# Patient Record
Sex: Female | Born: 1965 | Hispanic: Yes | Marital: Married | Smoking: Former smoker
Health system: Southern US, Community
[De-identification: ages and names within clinical notes are randomized; demographics above are authoritative.]

## PROBLEM LIST (undated history)

## (undated) DIAGNOSIS — M5030 Other cervical disc degeneration, unspecified cervical region: Secondary | ICD-10-CM

## (undated) DIAGNOSIS — E119 Type 2 diabetes mellitus without complications: Secondary | ICD-10-CM

## (undated) DIAGNOSIS — I10 Essential (primary) hypertension: Secondary | ICD-10-CM

## (undated) DIAGNOSIS — D649 Anemia, unspecified: Secondary | ICD-10-CM

## (undated) DIAGNOSIS — Z531 Procedure and treatment not carried out because of patient's decision for reasons of belief and group pressure: Secondary | ICD-10-CM

## (undated) DIAGNOSIS — T8859XA Other complications of anesthesia, initial encounter: Secondary | ICD-10-CM

## (undated) DIAGNOSIS — R011 Cardiac murmur, unspecified: Secondary | ICD-10-CM

## (undated) DIAGNOSIS — R1013 Epigastric pain: Secondary | ICD-10-CM

## (undated) DIAGNOSIS — I340 Nonrheumatic mitral (valve) insufficiency: Secondary | ICD-10-CM

## (undated) DIAGNOSIS — M791 Myalgia, unspecified site: Secondary | ICD-10-CM

## (undated) DIAGNOSIS — U071 COVID-19: Secondary | ICD-10-CM

## (undated) DIAGNOSIS — I361 Nonrheumatic tricuspid (valve) insufficiency: Secondary | ICD-10-CM

## (undated) DIAGNOSIS — M1990 Unspecified osteoarthritis, unspecified site: Secondary | ICD-10-CM

## (undated) DIAGNOSIS — R12 Heartburn: Secondary | ICD-10-CM

## (undated) DIAGNOSIS — N393 Stress incontinence (female) (male): Secondary | ICD-10-CM

## (undated) DIAGNOSIS — M51379 Other intervertebral disc degeneration, lumbosacral region without mention of lumbar back pain or lower extremity pain: Secondary | ICD-10-CM

## (undated) DIAGNOSIS — K219 Gastro-esophageal reflux disease without esophagitis: Secondary | ICD-10-CM

## (undated) DIAGNOSIS — F329 Major depressive disorder, single episode, unspecified: Secondary | ICD-10-CM

## (undated) DIAGNOSIS — F419 Anxiety disorder, unspecified: Secondary | ICD-10-CM

## (undated) DIAGNOSIS — F32A Depression, unspecified: Secondary | ICD-10-CM

## (undated) DIAGNOSIS — I351 Nonrheumatic aortic (valve) insufficiency: Secondary | ICD-10-CM

## (undated) DIAGNOSIS — M5412 Radiculopathy, cervical region: Secondary | ICD-10-CM

## (undated) DIAGNOSIS — N926 Irregular menstruation, unspecified: Secondary | ICD-10-CM

## (undated) DIAGNOSIS — E559 Vitamin D deficiency, unspecified: Secondary | ICD-10-CM

## (undated) HISTORY — DX: Essential (primary) hypertension: I10

## (undated) HISTORY — PX: CHOLECYSTECTOMY, LAPAROSCOPIC: SHX56

## (undated) HISTORY — PX: INCISION TENDON SHEATH HAND: SUR698

## (undated) HISTORY — DX: Type 2 diabetes mellitus without complications: E11.9

## (undated) HISTORY — PX: LAPAROSCOPIC OVARIAN CYSTECTOMY: SHX6248

## (undated) HISTORY — PX: MENISCECTOMY: SHX123

## (undated) HISTORY — PX: JOINT REPLACEMENT: SHX530

## (undated) HISTORY — PX: ESOPHAGOGASTRODUODENOSCOPY: SHX1529

## (undated) HISTORY — PX: CHOLECYSTECTOMY: SHX55

## (undated) HISTORY — PX: TUBAL LIGATION: SHX77

## (undated) HISTORY — DX: Heartburn: R12

---

## 2013-01-03 LAB — HM PAP SMEAR

## 2014-08-08 LAB — HM MAMMOGRAPHY

## 2014-11-28 ENCOUNTER — Emergency Department: Admit: 2014-11-28 | Disposition: A | Discharge status: Home / Self Care | Payer: Self-pay | Admitting: Student

## 2015-10-03 ENCOUNTER — Encounter: Payer: Self-pay | Admitting: Obstetrics and Gynecology

## 2015-10-23 ENCOUNTER — Other Ambulatory Visit: Payer: Self-pay | Admitting: Obstetrics and Gynecology

## 2015-10-23 ENCOUNTER — Encounter: Payer: Self-pay | Admitting: Obstetrics and Gynecology

## 2015-10-23 ENCOUNTER — Other Ambulatory Visit (INDEPENDENT_AMBULATORY_CARE_PROVIDER_SITE_OTHER): Payer: BLUE CROSS/BLUE SHIELD

## 2015-10-23 ENCOUNTER — Ambulatory Visit (INDEPENDENT_AMBULATORY_CARE_PROVIDER_SITE_OTHER): Payer: BLUE CROSS/BLUE SHIELD | Admitting: Obstetrics and Gynecology

## 2015-10-23 VITALS — BP 155/85 | HR 69 | Ht 64.0 in | Wt 177.0 lb

## 2015-10-23 DIAGNOSIS — N920 Excessive and frequent menstruation with regular cycle: Secondary | ICD-10-CM

## 2015-10-23 DIAGNOSIS — D649 Anemia, unspecified: Secondary | ICD-10-CM | POA: Insufficient documentation

## 2015-10-23 DIAGNOSIS — D259 Leiomyoma of uterus, unspecified: Secondary | ICD-10-CM | POA: Insufficient documentation

## 2015-10-23 NOTE — Patient Instructions (Signed)
1. Endometrial biopsy is performed today. 2. Ultrasound is performed today. 3. Return in 10 days for follow-up on test results and further management planning

## 2015-10-23 NOTE — Progress Notes (Signed)
GYN ENCOUNTER NOTE  Subjective:   Spanish interpreter is utilized     Caitlyn [name] is a 50 y.o. [phone] female is here for gynecologic evaluation of the following issues:  1. Menorrhagia to anemia.   2. Dyspareunia  The patient is a 50 year old married Hispanic female para 3013, using BTL for contraception, who presents for evaluation of the above complaints.  Past GYN history: Menarche-Age 25 Cycles- monthly Duration of flow- 5 days(2H, 3L) Dysmenorrhea-mild; central and left lower quadrant cramping with minimal low back pain; occasionally takes Advil for cramps Deep thrusting dyspareunia is noted No history of abnormal Pap smears. No history of STI's. Menorrhagia, worsening with new diagnosis of anemia Ultrasound 06/19/2011-uterus measures 7.0 x 5.9 cm; endometrial stripe 1.9 cm; right and left ovaries normal; free fluid in cul-de-sac 10/24/2014 LABS: CBC  9.3/31/332,000; Iron 19 (low); TIBC 440 (normal); iron saturation 4% (L)   Gynecologic History Patient's last menstrual period was 10/20/2015. Contraception: tubal ligation Last Pap: normal. Results were: normal  Obstetric History OB History  Gravida Para Term Preterm AB SAB TAB Ectopic Multiple Living  4 3 3  1 1    3     # Outcome Date GA Lbr Len/2nd Weight Sex Delivery Anes PTL Lv  4 Term 02/02/91    [place]  3 SAB 1992        FD  2 Term 04/16/89    [place]  1 Term 06/24/84    [place]      Past Medical History  Diagnosis Date  . Hypertension   . Heart burn     Past Surgical History  Procedure Laterality Date  . Cholecystectomy, laparoscopic    . Laparoscopic ovarian cystectomy    . Tubal ligation      No current outpatient prescriptions on file prior to visit.   No current facility-administered medications on file prior to visit.    No Known Allergies  Social History   Social History  . Marital Status: Single    Spouse Name: N/A  . Number of Children: N/A  .  Years of Education: N/A   Occupational History  . Not on file.   Social History Main Topics  . Smoking status: Former Research scientist (life sciences)  . Smokeless tobacco: Never Used  . Alcohol Use: 0.0 oz/week    0 Standard drinks or equivalent per week  . Drug Use: No  . Sexual Activity:    Partners: Male   Other Topics Concern  . Not on file   Social History Narrative  . No narrative on file    Family History  Problem Relation Age of Onset  . Osteoarthritis Mother   . Osteoarthritis Sister   . Heart failure Father   . Diabetes Father   . Hypertension Father     The following portions of the patient's history were reviewed and updated as appropriate: allergies, current medications, past family history, past medical history, past social history, past surgical history and problem list.  Review of Systems Review of Systems - General ROS: negative for - chills, fatigue, fever, hot flashes, malaise or night sweats Hematological and Lymphatic ROS: negative for - bleeding problems or swollen lymph nodes Gastrointestinal ROS: negative for - abdominal pain, blood in stools, change in bowel habits and nausea/vomiting Musculoskeletal ROS: negative for - joint pain, muscle pain or muscular weakness Genito-Urinary ROS: negative for -   dysuria, genital discharge, genital ulcers, hematuria, incontinence, nocturia . POSITIVE-change  in menstrual cycle, dysmenorrhea,dyspareunia,irregular/heavy menses, pelvic pain Objective:   BP 155/85 mmHg  Pulse 69  Ht 5\' 4"  (1.626 m)  Wt 177 lb (80.287 kg)  BMI 30.37 kg/m2  LMP 10/20/2015  CONSTITUTIONAL: Well-developed, well-nourished female in no acute distress.  HENT:  Normocephalic, atraumatic.  NECK: Normal range of motion, supple, no masses.  Normal thyroid.  SKIN: Skin is warm and dry. No rash noted. Not diaphoretic. No erythema. No pallor. [place]: Alert and oriented to person, place, and time. PSYCHIATRIC: Normal mood and affect. Normal behavior. Normal  judgment and thought content. CARDIOVASCULAR:Not Examined RESPIRATORY: Not Examined BREASTS: Not Examined ABDOMEN: Soft, non distended; Non tender.  No Organomegaly. PELVIC:  External Genitalia: Normal  BUS: Normal  Vagina: Normal  Cervix: Normal; parous, no cervical motion tenderness, decreased mobility  Uterus: irregular, 14-16 weeks size, decreased mobility, left sided prominence  Adnexa: Normal; nonpalpable and nontender  RV: Normal external exam  Bladder: Nontender MUSCULOSKELETAL: Normal range of motion. No tenderness.  No cyanosis, clubbing, or edema.  PROCEDURE: Endometrial biopsy Indications-menorrhagia to anemia Bimanual exam-14-16 weeks size with left-sided prominence, globular Uterine sound-10 cm Endometrial biopsy 3 obtained with 3 mm pipette with production of yellow fluid on first pass, burgundy fluid/tissue on second and third passes Estimated blood loss-minimal Procedure well-tolerated     Assessment:   1. Menorrhagia to anemia 2. Dysmenorrhea/dyspareunia 3. Pelvic pain left lower quadrant 4. Fibroid uterus   Plan:  1. Endometrial biopsy is noted 2. Pelvic ultrasound 3. Return in 10 days for follow-up and further management planning  A total of 30 minutes were spent face-to-face with the patient during the encounter with greater than 50% dealing with counseling and coordination of care.  Brayton Mars, MD  Note: This dictation was prepared with Dragon dictation along with smaller phrase technology. Any transcriptional errors that result from this process are unintentional.

## 2015-10-25 LAB — PATHOLOGY

## 2015-11-05 ENCOUNTER — Encounter: Payer: BLUE CROSS/BLUE SHIELD | Admitting: Obstetrics and Gynecology

## 2015-11-05 NOTE — Progress Notes (Signed)
This encounter was created in error - please disregard.

## 2015-11-07 ENCOUNTER — Encounter: Payer: Self-pay | Admitting: Obstetrics and Gynecology

## 2015-11-07 ENCOUNTER — Ambulatory Visit (INDEPENDENT_AMBULATORY_CARE_PROVIDER_SITE_OTHER): Payer: BLUE CROSS/BLUE SHIELD | Admitting: Obstetrics and Gynecology

## 2015-11-07 VITALS — BP 146/81 | HR 66 | Ht 64.0 in | Wt 180.3 lb

## 2015-11-07 DIAGNOSIS — D259 Leiomyoma of uterus, unspecified: Secondary | ICD-10-CM | POA: Diagnosis not present

## 2015-11-07 DIAGNOSIS — N920 Excessive and frequent menstruation with regular cycle: Secondary | ICD-10-CM | POA: Diagnosis not present

## 2015-11-07 DIAGNOSIS — D649 Anemia, unspecified: Secondary | ICD-10-CM

## 2015-11-07 NOTE — Patient Instructions (Signed)
1. TAH LSO will be scheduled today 2. Return for preop appointment the week before surgery 3. Recommend iron supplementation daily due to anemia

## 2015-11-08 NOTE — Progress Notes (Signed)
Chief complaint: 1. Menorrhagia to anemia 2. Multiple fibroid uterus 3. Follow-up on endometrial biopsy  Patient presents today for follow-up on the above issues. Endometrial biopsy was performed because of history of menorrhagia to anemia. Pelvic ultrasound has demonstrated an enlarged fibroid uterus. Endometrium was thickened. Clinical exam is notable for 14-16 week size fibroid uterus  Endometrial biopsy results: Fragmented weakly proliferative endometrium with stromal and glandular breakdown, mixed with blood. No hyperplasia or carcinoma.  These findings were reviewed with the patient. Interpreter was present. Options of management were reviewed.  OBJECTIVE: BP 146/81 mmHg  Pulse 66  Ht 5\' 4"  (1.626 m)  Wt 180 lb 4.8 oz (81.784 kg)  BMI 30.93 kg/m2  LMP 10/20/2015 (Exact Date) Physical exam-deferred  ASSESSMENT: 1. Multi-fibroid uterus-14-16 weeks, symptomatic 2. Benign endometrial biopsy  PLAN: 1. Scheduled TAH/LSO 2. Return for preop appointment the week prior to surgery 3. Multiple questions regarding surgery were answered with the aid of interpreter.  A total of 15 minutes were spent face-to-face with the patient during this encounter and over half of that time dealt with counseling and coordination of care.  Brayton Mars, MD  Note: This dictation was prepared with Dragon dictation along with smaller phrase technology. Any transcriptional errors that result from this process are unintentional.

## 2016-04-03 DIAGNOSIS — M2550 Pain in unspecified joint: Secondary | ICD-10-CM | POA: Insufficient documentation

## 2016-04-03 DIAGNOSIS — M25562 Pain in left knee: Secondary | ICD-10-CM | POA: Insufficient documentation

## 2016-04-10 DIAGNOSIS — R768 Other specified abnormal immunological findings in serum: Secondary | ICD-10-CM | POA: Insufficient documentation

## 2016-04-10 DIAGNOSIS — R7689 Other specified abnormal immunological findings in serum: Secondary | ICD-10-CM | POA: Insufficient documentation

## 2016-04-13 ENCOUNTER — Ambulatory Visit
Admission: RE | Admit: 2016-04-13 | Discharge: 2016-04-13 | Disposition: A | Discharge status: Home / Self Care | Payer: Disability Insurance | Source: Ambulatory Visit | Attending: Ophthalmology | Admitting: Ophthalmology

## 2016-04-13 ENCOUNTER — Other Ambulatory Visit: Payer: Self-pay | Admitting: Ophthalmology

## 2016-04-13 DIAGNOSIS — M5136 Other intervertebral disc degeneration, lumbar region: Secondary | ICD-10-CM

## 2016-04-13 DIAGNOSIS — M47816 Spondylosis without myelopathy or radiculopathy, lumbar region: Secondary | ICD-10-CM

## 2016-04-13 DIAGNOSIS — M5126 Other intervertebral disc displacement, lumbar region: Secondary | ICD-10-CM

## 2016-04-13 DIAGNOSIS — M545 Low back pain: Secondary | ICD-10-CM | POA: Diagnosis present

## 2016-06-24 ENCOUNTER — Encounter
Admission: RE | Admit: 2016-06-24 | Discharge: 2016-06-24 | Disposition: A | Discharge status: Home / Self Care | Payer: BLUE CROSS/BLUE SHIELD | Source: Ambulatory Visit | Attending: Orthopedic Surgery | Admitting: Orthopedic Surgery

## 2016-06-24 DIAGNOSIS — I10 Essential (primary) hypertension: Secondary | ICD-10-CM | POA: Diagnosis not present

## 2016-06-24 DIAGNOSIS — Z01810 Encounter for preprocedural cardiovascular examination: Secondary | ICD-10-CM | POA: Insufficient documentation

## 2016-06-24 DIAGNOSIS — Z01812 Encounter for preprocedural laboratory examination: Secondary | ICD-10-CM | POA: Insufficient documentation

## 2016-06-24 HISTORY — DX: Gastro-esophageal reflux disease without esophagitis: K21.9

## 2016-06-24 HISTORY — DX: Anemia, unspecified: D64.9

## 2016-06-24 HISTORY — DX: Anxiety disorder, unspecified: F41.9

## 2016-06-24 LAB — URINALYSIS COMPLETE WITH MICROSCOPIC (ARMC ONLY)
BILIRUBIN URINE: NEGATIVE
GLUCOSE, UA: NEGATIVE mg/dL
HGB URINE DIPSTICK: NEGATIVE
Ketones, ur: NEGATIVE mg/dL
LEUKOCYTES UA: NEGATIVE
Nitrite: NEGATIVE
Protein, ur: NEGATIVE mg/dL
SPECIFIC GRAVITY, URINE: 1.018 (ref 1.005–1.030)
pH: 6 (ref 5.0–8.0)

## 2016-06-24 LAB — CBC
HCT: 38.4 % (ref 35.0–47.0)
Hemoglobin: 12.6 g/dL (ref 12.0–16.0)
MCH: 24.5 pg — AB (ref 26.0–34.0)
MCHC: 32.8 g/dL (ref 32.0–36.0)
MCV: 74.7 fL — AB (ref 80.0–100.0)
PLATELETS: 206 10*3/uL (ref 150–440)
RBC: 5.14 MIL/uL (ref 3.80–5.20)
RDW: 19.7 % — ABNORMAL HIGH (ref 11.5–14.5)
WBC: 11.8 10*3/uL — ABNORMAL HIGH (ref 3.6–11.0)

## 2016-06-24 LAB — BASIC METABOLIC PANEL
Anion gap: 7 (ref 5–15)
BUN: 10 mg/dL (ref 6–20)
CHLORIDE: 102 mmol/L (ref 101–111)
CO2: 29 mmol/L (ref 22–32)
CREATININE: 0.51 mg/dL (ref 0.44–1.00)
Calcium: 9.3 mg/dL (ref 8.9–10.3)
GFR calc Af Amer: >60 mL/min (ref >60)
GLUCOSE: 114 mg/dL — AB (ref 65–99)
POTASSIUM: 3.4 mmol/L — AB (ref 3.5–5.1)
Sodium: 138 mmol/L (ref 135–145)

## 2016-06-24 LAB — SURGICAL PCR SCREEN
MRSA, PCR: NEGATIVE
Staphylococcus aureus: NEGATIVE

## 2016-06-24 LAB — TYPE AND SCREEN
ABO/RH(D): O POS
Antibody Screen: NEGATIVE

## 2016-06-24 LAB — PROTIME-INR
INR: 1
Prothrombin Time: 13.2 seconds (ref 11.4–15.2)

## 2016-06-24 LAB — SEDIMENTATION RATE: SED RATE: 9 mm/h (ref 0–30)

## 2016-06-24 LAB — APTT: aPTT: 26 seconds (ref 24–36)

## 2016-06-24 MED ORDER — TRANEXAMIC ACID 1000 MG/10ML IV SOLN
1000.0000 mg | INTRAVENOUS | Status: DC
  Filled 2016-06-24: qty 10

## 2016-06-24 NOTE — Patient Instructions (Signed)
Your procedure is scheduled on: Tuesday 07/07/16 Su procedimiento est programado para: Report to Day Surgery. 2ND FLOOR MEDICAL MALL ENTRANCE Presntese a: To find out your arrival time please call [phone] between 1PM - 3PM on Monday 07/06/16. Para saber su hora de llegada por favor llame al [phone] entre la 1PM - 3PM el da:  Remember: Instructions that are not followed completely may result in serious medical risk, up to and including death, or upon the discretion of your surgeon and anesthesiologist your surgery may need to be rescheduled.  Recuerde: Las instrucciones que no se siguen completamente Heritage manager en un riesgo de salud grave, incluyendo hasta la [place] o a discrecin de su cirujano y Environmental health practitioner, su ciruga se puede posponer.   __X__ 1. Do not eat food or drink liquids after midnight. No gum chewing or hard candies.  No coma alimentos ni tome lquidos despus de la medianoche.  No mastique chicle ni caramelos  duros.     __X__ 2. No alcohol/NO SMOKING for 24 hours before or after surgery.    No tome alcohol durante las 24 horas antes ni despus de la [place].   ____ 3. Bring all medications with you on the day of surgery if instructed.    Lleve todos los medicamentos con usted el da de su ciruga si se le ha indicado as.   __X__ 4. Notify your doctor if there is any change in your medical condition (cold, fever,                             infections).    Informe a su mdico si hay algn cambio en su condicin mdica (resfriado, fiebre, infecciones).   Do not wear jewelry, make-up, hairpins, clips or nail polish.  No use joyas, maquillajes, pinzas/ganchos para el cabello ni esmalte de uas.  Do not wear lotions, powders, or perfumes. You may wear deodorant.  No use lociones, polvos o perfumes.  Puede usar desodorante.    Do not shave 48 hours prior to surgery. Men may shave face and neck.  No se afeite 48 horas antes de la [place].  Los hombres pueden  [employer] cara y el cuello.   Do not bring valuables to the hospital.   No lleve objetos [place] is not responsible for any belongings or valuables.  [place] no se hace responsable de ningn tipo de pertenencias u objetos de Geographical information systems officer.               Contacts, dentures or bridgework may not be worn into surgery.  Los lentes de [place], las dentaduras postizas o puentes no se pueden usar en la [place].  Leave your suitcase in the car. After surgery it may be brought to your room.  Deje su maleta en el auto.  Despus de la ciruga podr traerla a su habitacin.  For patients admitted to the hospital, discharge time is determined by your treatment team.  Para los pacientes que sean ingresados al hospital, el tiempo en el cual se le dar de alta es determinado por su                equipo de [place].   Patients discharged the day of surgery will not be allowed to drive home. A los pacientes que se les da de alta el mismo da de la ciruga no se les permitir conducir a Holiday representative.   Please read  over the following fact sheets that you were given: Por favor [place] hojas de informacin que le dieron:   Pain Booklet   __X__ Take these medicines the morning of surgery with A SIP OF WATER:          [employer] medicinas la maana de la ciruga con UN SORBO DE AGUA:  1. LISINOPRIL  2. OMEPRAZOLE  3.   4.       5.  6.  ____ Fleet Enema (as directed)          Enema de Fleet (segn lo indicado)    __X__ Use CHG Soap as directed          Utilice el jabn de CHG segn lo indicado  ____ Use inhalers on the day of surgery          Use los inhaladores el da de la ciruga  ____ Stop metformin 2 days prior to surgery          Deje de tomar el metformin 2 das antes de la ciruga    ____ Take 1/2 of usual insulin dose the night before surgery and none on the morning of surgery           Tome la mitad de la dosis habitual de insulina la noche antes de la  [place] y no tome nada en la maana de la             ciruga  ____ Stop Coumadin/Plavix/aspirin on           Deje de tomar el Coumadin/Plavix/aspirina el da:  ____ Stop Anti-inflammatories on           Deje de tomar antiinflamatorios el da:   __X__ Stop supplements until after surgery  (COLON CLEANSE SUPPLEMENTS)          Deje de tomar suplementos hasta despus de la ciruga  ____ Bring C-Pap to the hospital          [place] al hospital

## 2016-06-26 LAB — URINE CULTURE

## 2016-06-26 NOTE — Pre-Procedure Instructions (Signed)
Urine culture results faxed to Dr. Rudene Christians office.

## 2016-07-07 ENCOUNTER — Encounter
Admission: RE | Disposition: A | Discharge status: Home Health | Payer: Self-pay | Source: Ambulatory Visit | Attending: Orthopedic Surgery

## 2016-07-07 ENCOUNTER — Inpatient Hospital Stay
Admission: RE | Admit: 2016-07-07 | Discharge: 2016-07-10 | DRG: 470 | Disposition: A | Discharge status: Home Health | Payer: BLUE CROSS/BLUE SHIELD | Source: Ambulatory Visit | Attending: Orthopedic Surgery | Admitting: Orthopedic Surgery

## 2016-07-07 ENCOUNTER — Inpatient Hospital Stay: Payer: BLUE CROSS/BLUE SHIELD | Admitting: Anesthesiology

## 2016-07-07 ENCOUNTER — Inpatient Hospital Stay: Payer: BLUE CROSS/BLUE SHIELD

## 2016-07-07 DIAGNOSIS — Z87891 Personal history of nicotine dependence: Secondary | ICD-10-CM | POA: Diagnosis not present

## 2016-07-07 DIAGNOSIS — F419 Anxiety disorder, unspecified: Secondary | ICD-10-CM | POA: Diagnosis present

## 2016-07-07 DIAGNOSIS — M25561 Pain in right knee: Secondary | ICD-10-CM | POA: Diagnosis present

## 2016-07-07 DIAGNOSIS — K219 Gastro-esophageal reflux disease without esophagitis: Secondary | ICD-10-CM | POA: Diagnosis present

## 2016-07-07 DIAGNOSIS — M1711 Unilateral primary osteoarthritis, right knee: Principal | ICD-10-CM | POA: Diagnosis present

## 2016-07-07 DIAGNOSIS — Z79899 Other long term (current) drug therapy: Secondary | ICD-10-CM | POA: Diagnosis not present

## 2016-07-07 DIAGNOSIS — R262 Difficulty in walking, not elsewhere classified: Secondary | ICD-10-CM

## 2016-07-07 DIAGNOSIS — G8918 Other acute postprocedural pain: Secondary | ICD-10-CM

## 2016-07-07 DIAGNOSIS — I10 Essential (primary) hypertension: Secondary | ICD-10-CM | POA: Diagnosis present

## 2016-07-07 DIAGNOSIS — M6281 Muscle weakness (generalized): Secondary | ICD-10-CM

## 2016-07-07 HISTORY — PX: TOTAL KNEE ARTHROPLASTY: SHX125

## 2016-07-07 LAB — CREATININE, SERUM: CREATININE: 0.48 mg/dL (ref 0.44–1.00)

## 2016-07-07 LAB — CBC
HEMATOCRIT: 36.5 % (ref 35.0–47.0)
HEMOGLOBIN: 11.8 g/dL — AB (ref 12.0–16.0)
MCH: 25 pg — ABNORMAL LOW (ref 26.0–34.0)
MCHC: 32.4 g/dL (ref 32.0–36.0)
MCV: 77.1 fL — ABNORMAL LOW (ref 80.0–100.0)
Platelets: 254 10*3/uL (ref 150–440)
RBC: 4.73 MIL/uL (ref 3.80–5.20)
RDW: 19.4 % — ABNORMAL HIGH (ref 11.5–14.5)
WBC: 18.9 10*3/uL — ABNORMAL HIGH (ref 3.6–11.0)

## 2016-07-07 LAB — POCT PREGNANCY, URINE: PREG TEST UR: NEGATIVE

## 2016-07-07 LAB — ABO/RH: ABO/RH(D): O POS

## 2016-07-07 SURGERY — ARTHROPLASTY, KNEE, TOTAL
Anesthesia: Spinal | Site: Knee | Laterality: Right | Wound class: Clean

## 2016-07-07 MED ORDER — CEFAZOLIN SODIUM-DEXTROSE 2-4 GM/100ML-% IV SOLN
INTRAVENOUS | Status: AC
  Filled 2016-07-07: qty 100

## 2016-07-07 MED ORDER — BUPIVACAINE LIPOSOME 1.3 % IJ SUSP
INTRAMUSCULAR | Status: AC
  Filled 2016-07-07: qty 20

## 2016-07-07 MED ORDER — ZOLPIDEM TARTRATE 5 MG PO TABS
5.0000 mg | ORAL_TABLET | Freq: Every evening | ORAL | Status: DC | PRN
  Administered 2016-07-07: 5 mg via ORAL
  Filled 2016-07-07 (×2): qty 1

## 2016-07-07 MED ORDER — SODIUM CHLORIDE 0.9 % IV SOLN
INTRAVENOUS | Status: DC
  Administered 2016-07-07 – 2016-07-08 (×2): via INTRAVENOUS

## 2016-07-07 MED ORDER — NEOMYCIN-POLYMYXIN B GU 40-200000 IR SOLN
Status: AC
  Filled 2016-07-07: qty 20

## 2016-07-07 MED ORDER — MORPHINE SULFATE (PF) 2 MG/ML IV SOLN
2.0000 mg | INTRAVENOUS | Status: DC | PRN
  Administered 2016-07-07 (×2): 2 mg via INTRAVENOUS
  Filled 2016-07-07 (×2): qty 1

## 2016-07-07 MED ORDER — MENTHOL 3 MG MT LOZG
1.0000 | LOZENGE | OROMUCOSAL | Status: DC | PRN
  Filled 2016-07-07: qty 9

## 2016-07-07 MED ORDER — KETOROLAC TROMETHAMINE 30 MG/ML IJ SOLN
INTRAMUSCULAR | Status: DC | PRN
  Administered 2016-07-07: 30 mg

## 2016-07-07 MED ORDER — LIDOCAINE HCL (PF) 1 % IJ SOLN
INTRAMUSCULAR | Status: AC
  Filled 2016-07-07: qty 2

## 2016-07-07 MED ORDER — PANTOPRAZOLE SODIUM 40 MG PO TBEC
40.0000 mg | DELAYED_RELEASE_TABLET | Freq: Two times a day (BID) | ORAL | Status: DC
  Administered 2016-07-07 – 2016-07-10 (×7): 40 mg via ORAL
  Filled 2016-07-07 (×7): qty 1

## 2016-07-07 MED ORDER — SODIUM CHLORIDE 0.9 % IV SOLN: INTRAVENOUS | Status: DC | PRN

## 2016-07-07 MED ORDER — OXYCODONE HCL 5 MG PO TABS
5.0000 mg | ORAL_TABLET | ORAL | Status: DC | PRN
  Administered 2016-07-07: 10 mg via ORAL
  Administered 2016-07-07: 5 mg via ORAL
  Administered 2016-07-07 – 2016-07-08 (×2): 10 mg via ORAL
  Administered 2016-07-09 – 2016-07-10 (×3): 5 mg via ORAL
  Filled 2016-07-07 (×2): qty 2
  Filled 2016-07-07 (×3): qty 1
  Filled 2016-07-07 (×2): qty 2

## 2016-07-07 MED ORDER — ACETAMINOPHEN 325 MG PO TABS
650.0000 mg | ORAL_TABLET | ORAL | Status: DC | PRN
  Administered 2016-07-07: 650 mg via ORAL
  Filled 2016-07-07: qty 2

## 2016-07-07 MED ORDER — SERTRALINE HCL 100 MG PO TABS
100.0000 mg | ORAL_TABLET | Freq: Every day | ORAL | Status: DC
  Administered 2016-07-07 – 2016-07-09 (×3): 100 mg via ORAL
  Filled 2016-07-07 (×3): qty 1

## 2016-07-07 MED ORDER — METOCLOPRAMIDE HCL 5 MG/ML IJ SOLN: 5.0000 mg | Freq: Three times a day (TID) | INTRAMUSCULAR | Status: DC | PRN

## 2016-07-07 MED ORDER — NEOMYCIN-POLYMYXIN B GU 40-200000 IR SOLN
Status: DC | PRN
  Administered 2016-07-07: 12 mL

## 2016-07-07 MED ORDER — DIGESTIVE SUPPORT PO CAPS: 1.0000 | ORAL_CAPSULE | Freq: Every day | ORAL | Status: DC

## 2016-07-07 MED ORDER — COLON HERBAL CLEANSER PO CAPS: 1.0000 | ORAL_CAPSULE | Freq: Every day | ORAL | Status: DC

## 2016-07-07 MED ORDER — BUPIVACAINE-EPINEPHRINE (PF) 0.25% -1:200000 IJ SOLN
INTRAMUSCULAR | Status: DC | PRN
  Administered 2016-07-07: 30 mL

## 2016-07-07 MED ORDER — ALUM & MAG HYDROXIDE-SIMETH 200-200-20 MG/5ML PO SUSP: 30.0000 mL | ORAL | Status: DC | PRN

## 2016-07-07 MED ORDER — PHENOL 1.4 % MT LIQD
1.0000 | OROMUCOSAL | Status: DC | PRN
  Filled 2016-07-07: qty 177

## 2016-07-07 MED ORDER — ONDANSETRON HCL 4 MG/2ML IJ SOLN
4.0000 mg | Freq: Four times a day (QID) | INTRAMUSCULAR | Status: DC | PRN
  Administered 2016-07-07: 4 mg via INTRAVENOUS
  Filled 2016-07-07: qty 2

## 2016-07-07 MED ORDER — SODIUM CHLORIDE 0.9 % IJ SOLN
INTRAMUSCULAR | Status: AC
  Filled 2016-07-07: qty 100

## 2016-07-07 MED ORDER — ONDANSETRON HCL 4 MG/2ML IJ SOLN: 4.0000 mg | Freq: Once | INTRAMUSCULAR | Status: DC | PRN

## 2016-07-07 MED ORDER — ACETAMINOPHEN 325 MG PO TABS
650.0000 mg | ORAL_TABLET | Freq: Four times a day (QID) | ORAL | Status: DC | PRN
  Administered 2016-07-07: 650 mg via ORAL
  Filled 2016-07-07: qty 2

## 2016-07-07 MED ORDER — MIDAZOLAM HCL 5 MG/5ML IJ SOLN
INTRAMUSCULAR | Status: DC | PRN
  Administered 2016-07-07: 2 mg via INTRAVENOUS

## 2016-07-07 MED ORDER — LISINOPRIL 10 MG PO TABS
10.0000 mg | ORAL_TABLET | Freq: Every day | ORAL | Status: DC
  Administered 2016-07-08 – 2016-07-10 (×3): 10 mg via ORAL
  Filled 2016-07-07 (×3): qty 1

## 2016-07-07 MED ORDER — ENOXAPARIN SODIUM 30 MG/0.3ML ~~LOC~~ SOLN
30.0000 mg | Freq: Two times a day (BID) | SUBCUTANEOUS | Status: DC
  Administered 2016-07-08 – 2016-07-10 (×5): 30 mg via SUBCUTANEOUS
  Filled 2016-07-07 (×5): qty 0.3

## 2016-07-07 MED ORDER — PROPOFOL 500 MG/50ML IV EMUL
INTRAVENOUS | Status: DC | PRN
  Administered 2016-07-07: 100 ug/kg/min via INTRAVENOUS

## 2016-07-07 MED ORDER — METOCLOPRAMIDE HCL 10 MG PO TABS
5.0000 mg | ORAL_TABLET | Freq: Three times a day (TID) | ORAL | Status: DC | PRN
  Administered 2016-07-07: 10 mg via ORAL
  Filled 2016-07-07: qty 2

## 2016-07-07 MED ORDER — FENTANYL CITRATE (PF) 100 MCG/2ML IJ SOLN
25.0000 ug | INTRAMUSCULAR | Status: DC | PRN
Start: 2016-07-07 — End: 2016-07-07

## 2016-07-07 MED ORDER — LACTATED RINGERS IV SOLN
INTRAVENOUS | Status: DC
  Administered 2016-07-07 (×3): via INTRAVENOUS

## 2016-07-07 MED ORDER — MORPHINE SULFATE (PF) 10 MG/ML IV SOLN
INTRAVENOUS | Status: AC
  Filled 2016-07-07: qty 1

## 2016-07-07 MED ORDER — DOCUSATE SODIUM 100 MG PO CAPS
100.0000 mg | ORAL_CAPSULE | Freq: Two times a day (BID) | ORAL | Status: DC
  Administered 2016-07-07 – 2016-07-10 (×7): 100 mg via ORAL
  Filled 2016-07-07 (×7): qty 1

## 2016-07-07 MED ORDER — DIPHENHYDRAMINE HCL 12.5 MG/5ML PO ELIX: 12.5000 mg | ORAL_SOLUTION | ORAL | Status: DC | PRN

## 2016-07-07 MED ORDER — FAMOTIDINE 20 MG PO TABS
ORAL_TABLET | ORAL | Status: AC
  Administered 2016-07-07: 20 mg via ORAL
  Filled 2016-07-07: qty 1

## 2016-07-07 MED ORDER — BUPIVACAINE HCL (PF) 0.5 % IJ SOLN
INTRAMUSCULAR | Status: DC | PRN
  Administered 2016-07-07: 3 mL

## 2016-07-07 MED ORDER — BUPIVACAINE-EPINEPHRINE (PF) 0.25% -1:200000 IJ SOLN
INTRAMUSCULAR | Status: AC
  Filled 2016-07-07: qty 30

## 2016-07-07 MED ORDER — SODIUM CHLORIDE 0.9 % IV SOLN
1000.0000 mg | INTRAVENOUS | Status: AC
  Filled 2016-07-07: qty 10

## 2016-07-07 MED ORDER — MAGNESIUM CITRATE PO SOLN
1.0000 | Freq: Once | ORAL | Status: DC | PRN
  Filled 2016-07-07: qty 296

## 2016-07-07 MED ORDER — CEFAZOLIN SODIUM-DEXTROSE 2-4 GM/100ML-% IV SOLN
2.0000 g | Freq: Once | INTRAVENOUS | Status: AC
  Administered 2016-07-07: 2 g via INTRAVENOUS

## 2016-07-07 MED ORDER — TRANEXAMIC ACID 1000 MG/10ML IV SOLN
INTRAVENOUS | Status: DC | PRN
  Administered 2016-07-07: 1000 mg via INTRAVENOUS

## 2016-07-07 MED ORDER — MAGNESIUM HYDROXIDE 400 MG/5ML PO SUSP
30.0000 mL | Freq: Every day | ORAL | Status: DC | PRN
  Administered 2016-07-08 – 2016-07-10 (×3): 30 mL via ORAL
  Filled 2016-07-07 (×3): qty 30

## 2016-07-07 MED ORDER — ACETAMINOPHEN 650 MG RE SUPP: 650.0000 mg | Freq: Four times a day (QID) | RECTAL | Status: DC | PRN

## 2016-07-07 MED ORDER — FAMOTIDINE 20 MG PO TABS
20.0000 mg | ORAL_TABLET | Freq: Once | ORAL | Status: AC
  Administered 2016-07-07: 20 mg via ORAL

## 2016-07-07 MED ORDER — SODIUM CHLORIDE 0.9 % IV SOLN
INTRAVENOUS | Status: DC | PRN
  Administered 2016-07-07: 60 mL

## 2016-07-07 MED ORDER — CEFAZOLIN SODIUM-DEXTROSE 2-4 GM/100ML-% IV SOLN
2.0000 g | Freq: Four times a day (QID) | INTRAVENOUS | Status: AC
  Administered 2016-07-07 (×2): 2 g via INTRAVENOUS
  Filled 2016-07-07 (×2): qty 100

## 2016-07-07 MED ORDER — BISACODYL 10 MG RE SUPP
10.0000 mg | Freq: Every day | RECTAL | Status: DC | PRN
  Administered 2016-07-10: 10 mg via RECTAL
  Filled 2016-07-07: qty 1

## 2016-07-07 MED ORDER — ONDANSETRON HCL 4 MG PO TABS: 4.0000 mg | ORAL_TABLET | Freq: Four times a day (QID) | ORAL | Status: DC | PRN

## 2016-07-07 MED ORDER — PHENYLEPHRINE HCL 10 MG/ML IJ SOLN
INTRAMUSCULAR | Status: DC | PRN
  Administered 2016-07-07: 100 ug via INTRAVENOUS
  Administered 2016-07-07: 50 ug via INTRAVENOUS
  Administered 2016-07-07 (×4): 100 ug via INTRAVENOUS
  Administered 2016-07-07: 50 ug via INTRAVENOUS

## 2016-07-07 MED ORDER — FERROUS SULFATE 325 (65 FE) MG PO TABS
325.0000 mg | ORAL_TABLET | Freq: Every day | ORAL | Status: DC
Start: 2016-07-08 — End: 2016-07-10
  Administered 2016-07-08 – 2016-07-10 (×3): 325 mg via ORAL
  Filled 2016-07-07 (×3): qty 1

## 2016-07-07 MED ORDER — MORPHINE SULFATE 10 MG/ML IJ SOLN
INTRAMUSCULAR | Status: DC | PRN
  Administered 2016-07-07: 10 mg

## 2016-07-07 SURGICAL SUPPLY — 56 items
BANDAGE ACE 6X5 VEL STRL LF (GAUZE/BANDAGES/DRESSINGS) ×3 IMPLANT
BLADE SAW 1 (BLADE) ×6 IMPLANT
CANISTER SUCT 1200ML W/VALVE (MISCELLANEOUS) ×3 IMPLANT
CANISTER SUCT 3000ML (MISCELLANEOUS) ×6 IMPLANT
CAPT KNEE TOTAL 3 ×3 IMPLANT
CATH FOL LEG HOLDER (MISCELLANEOUS) ×3 IMPLANT
CATH TRAY METER 16FR LF (MISCELLANEOUS) ×3 IMPLANT
CEMENT HV SMART SET (Cement) ×6 IMPLANT
CHLORAPREP W/TINT 26ML (MISCELLANEOUS) ×6 IMPLANT
COOLER POLAR GLACIER W/PUMP (MISCELLANEOUS) ×3 IMPLANT
CUFF TOURN 24 STER (MISCELLANEOUS) IMPLANT
CUFF TOURN 30 STER DUAL PORT (MISCELLANEOUS) ×3 IMPLANT
DRAPE INCISE IOBAN 66X45 STRL (DRAPES) ×6 IMPLANT
DRAPE SHEET LG 3/4 BI-LAMINATE (DRAPES) ×6 IMPLANT
ELECT CAUTERY BLADE 6.4 (BLADE) ×3 IMPLANT
ELECT REM PT RETURN 9FT ADLT (ELECTROSURGICAL) ×3
ELECTRODE REM PT RTRN 9FT ADLT (ELECTROSURGICAL) ×1 IMPLANT
GAUZE PETRO XEROFOAM 1X8 (MISCELLANEOUS) ×3 IMPLANT
GAUZE SPONGE 4X4 12PLY STRL (GAUZE/BANDAGES/DRESSINGS) ×3 IMPLANT
GLOVE BIOGEL PI IND STRL 9 (GLOVE) ×1 IMPLANT
GLOVE BIOGEL PI INDICATOR 9 (GLOVE) ×2
GLOVE INDICATOR 8.0 STRL GRN (GLOVE) ×3 IMPLANT
GLOVE SURG ORTHO 8.0 STRL STRW (GLOVE) ×3 IMPLANT
GLOVE SURG SYN 9.0  PF PI (GLOVE) ×2
GLOVE SURG SYN 9.0 PF PI (GLOVE) ×1 IMPLANT
GOWN SRG 2XL LVL 4 RGLN SLV (GOWNS) ×1 IMPLANT
GOWN STRL NON-REIN 2XL LVL4 (GOWNS) ×2
GOWN STRL REUS W/ TWL LRG LVL3 (GOWN DISPOSABLE) ×1 IMPLANT
GOWN STRL REUS W/ TWL XL LVL3 (GOWN DISPOSABLE) ×1 IMPLANT
GOWN STRL REUS W/TWL LRG LVL3 (GOWN DISPOSABLE) ×2
GOWN STRL REUS W/TWL XL LVL3 (GOWN DISPOSABLE) ×2
HANDPIECE INTERPULSE COAX TIP (DISPOSABLE) ×2
HOOD PEEL AWAY FLYTE STAYCOOL (MISCELLANEOUS) ×6 IMPLANT
IMMBOLIZER KNEE 19 BLUE UNIV (SOFTGOODS) ×3 IMPLANT
KIT RM TURNOVER STRD PROC AR (KITS) ×3 IMPLANT
KNIFE SCULPS 14X20 (INSTRUMENTS) ×3 IMPLANT
NDL SAFETY 18GX1.5 (NEEDLE) ×3 IMPLANT
NEEDLE SPNL 18GX3.5 QUINCKE PK (NEEDLE) ×3 IMPLANT
NEEDLE SPNL 20GX3.5 QUINCKE YW (NEEDLE) ×3 IMPLANT
NS IRRIG 1000ML POUR BTL (IV SOLUTION) ×3 IMPLANT
PACK TOTAL KNEE (MISCELLANEOUS) ×3 IMPLANT
PAD WRAPON POLAR KNEE (MISCELLANEOUS) ×1 IMPLANT
SET HNDPC FAN SPRY TIP SCT (DISPOSABLE) ×1 IMPLANT
SOL .9 NS 3000ML IRR  AL (IV SOLUTION) ×2
SOL .9 NS 3000ML IRR UROMATIC (IV SOLUTION) ×1 IMPLANT
STAPLER SKIN PROX 35W (STAPLE) ×3 IMPLANT
SUCTION FRAZIER HANDLE 10FR (MISCELLANEOUS) ×2
SUCTION TUBE FRAZIER 10FR DISP (MISCELLANEOUS) ×1 IMPLANT
SUT DVC 2 QUILL PDO  T11 36X36 (SUTURE) ×2
SUT DVC 2 QUILL PDO T11 36X36 (SUTURE) ×1 IMPLANT
SUT DVC QUILL MONODERM 30X30 (SUTURE) ×3 IMPLANT
SYR 20CC LL (SYRINGE) ×3 IMPLANT
SYR 50ML LL SCALE MARK (SYRINGE) ×6 IMPLANT
TOWEL OR 17X26 4PK STRL BLUE (TOWEL DISPOSABLE) ×3 IMPLANT
TOWER CARTRIDGE SMART MIX (DISPOSABLE) ×3 IMPLANT
WRAPON POLAR PAD KNEE (MISCELLANEOUS) ×3

## 2016-07-07 NOTE — Anesthesia Preprocedure Evaluation (Signed)
Anesthesia Evaluation  Patient identified by MRN, date of birth, ID band Patient awake    Reviewed: Allergy & Precautions, NPO status , Patient's Chart, lab work & pertinent test results  History of Anesthesia Complications Negative for: history of anesthetic complications  Airway Mallampati: II       Dental   Pulmonary neg pulmonary ROS, former smoker,           Cardiovascular hypertension, Pt. on medications      Neuro/Psych Anxiety negative neurological ROS     GI/Hepatic Neg liver ROS, GERD  Medicated and Poorly Controlled,  Endo/Other  negative endocrine ROS  Renal/GU negative Renal ROS     Musculoskeletal   Abdominal   Peds  Hematology negative hematology ROS (+) anemia , JEHOVAH'S WITNESS  Anesthesia Other Findings   Reproductive/Obstetrics                             Anesthesia Physical Anesthesia Plan  ASA: II  Anesthesia Plan: Spinal   Post-op Pain Management:    Induction:   Airway Management Planned:   Additional Equipment:   Intra-op Plan:   Post-operative Plan:   Informed Consent: I have reviewed the patients History and Physical, chart, labs and discussed the procedure including the risks, benefits and alternatives for the proposed anesthesia with the patient or authorized representative who has indicated his/her understanding and acceptance.     Plan Discussed with:   Anesthesia Plan Comments:         Anesthesia Quick Evaluation

## 2016-07-07 NOTE — Anesthesia Procedure Notes (Signed)
Date/Time: 07/07/2016 10:41 AM Performed by: Nelda Marseille Pre-anesthesia Checklist: Patient identified, Emergency Drugs available, Suction available, Patient being monitored and Timeout performed Oxygen Delivery Method: Simple face mask

## 2016-07-07 NOTE — Anesthesia Procedure Notes (Signed)
Spinal  Patient location during procedure: OR Start time: 07/07/2016 10:02 AM End time: 07/07/2016 10:13 AM Staffing Resident/CRNA: Nelda Marseille Performed: resident/CRNA  Preanesthetic Checklist Completed: patient identified, site marked, surgical consent, pre-op evaluation, timeout performed, IV checked, risks and benefits discussed and monitors and equipment checked Spinal Block Patient position: sitting Prep: Betadine Patient monitoring: heart rate, continuous pulse ox, blood pressure and cardiac monitor Approach: midline Location: L4-5 Injection technique: single-shot Needle Needle type: [place] and Introducer  Needle gauge: 24 G Needle length: 9 cm Assessment Sensory level: T10 Additional Notes Negative paresthesia. Negative blood return. Positive free-flowing CSF. Expiration date of kit checked and confirmed. Patient tolerated procedure well, without complications.

## 2016-07-07 NOTE — OR Nursing (Signed)
Interpreter present during admission process

## 2016-07-07 NOTE — NC FL2 (Signed)
[place] LEVEL OF CARE SCREENING TOOL     IDENTIFICATION  Patient Name: Caitlyn [name] Birthdate: 1966-07-28 Sex: female Admission Date (Current Location): 07/07/2016  [place] and [place] Number:  Engineering geologist and Address:  [facility], [address], [place], [place] [address]      Provider Number: [id number]  Attending Physician Name and Address:  Hessie Knows, MD  Relative Name and Phone Number:       Current Level of Care: Hospital Recommended Level of Care: [place] Prior Approval Number:    Date Approved/Denied:   PASRR Number:  ([id number])  Discharge Plan: SNF    Current Diagnoses: Patient Active Problem List   Diagnosis Date Noted  . Primary localized osteoarthritis of right knee 07/07/2016  . Anemia 10/23/2015  . Menorrhagia with regular cycle 10/23/2015  . Fibroid uterus 10/23/2015    Orientation RESPIRATION BLADDER Height & Weight     Self, Time, Situation, Place  Normal External catheter Weight: 180 lb (81.6 kg) Height:  5\' 4"  (162.6 cm)  BEHAVIORAL SYMPTOMS/MOOD NEUROLOGICAL BOWEL NUTRITION STATUS   (None)  (None) Continent Diet (Diet: Regular )  AMBULATORY STATUS COMMUNICATION OF NEEDS Skin   Extensive Assist Verbally Surgical wounds (Incision: Right Knee)                       Personal Care Assistance Level of Assistance  Bathing, Feeding, Dressing Bathing Assistance: Limited assistance Feeding assistance: Independent Dressing Assistance: Limited assistance     Functional Limitations Info  Sight, Hearing, Speech Sight Info: Adequate Hearing Info: Adequate Speech Info: Adequate    SPECIAL CARE FACTORS FREQUENCY  PT (By licensed PT), OT (By licensed OT)     PT Frequency:  (5) OT Frequency:  (5)            Contractures      Additional Factors Info  Code Status, Allergies Code Status Info:  (Full Code) Allergies Info:  (No Known Allergies)          Current Medications (07/07/2016):  This is the current hospital active medication list Current Facility-Administered Medications  Medication Dose Route Frequency Provider Last Rate Last Dose  . 0.9 %  sodium chloride infusion   Intravenous Continuous Hessie Knows, MD      . acetaminophen (TYLENOL) tablet 650 mg  650 mg Oral Q6H PRN Hessie Knows, MD       Or  . acetaminophen (TYLENOL) suppository 650 mg  650 mg Rectal Q6H PRN Hessie Knows, MD      . alum & mag hydroxide-simeth (MAALOX/MYLANTA) 200-200-20 MG/5ML suspension 30 mL  30 mL Oral Q4H PRN Hessie Knows, MD      . bisacodyl (DULCOLAX) suppository 10 mg  10 mg Rectal Daily PRN Hessie Knows, MD      . ceFAZolin (ANCEF) IVPB 2g/100 mL premix  2 g Intravenous Q6H Hessie Knows, MD      . diphenhydrAMINE (BENADRYL) 12.5 MG/5ML elixir 12.5-25 mg  12.5-25 mg Oral Q4H PRN Hessie Knows, MD      . docusate sodium (COLACE) capsule 100 mg  100 mg Oral BID Hessie Knows, MD      . Derrill Memo ON 07/08/2016] enoxaparin (LOVENOX) injection 30 mg  30 mg Subcutaneous Q12H Hessie Knows, MD      . Derrill Memo ON 07/08/2016] ferrous sulfate tablet 325 mg  325 mg Oral Q breakfast Hessie Knows, MD      . lidocaine (PF) (XYLOCAINE) 1 % injection           .  lisinopril (PRINIVIL,ZESTRIL) tablet 10 mg  10 mg Oral Daily Hessie Knows, MD      . magnesium citrate solution 1 Bottle  1 Bottle Oral Once PRN Hessie Knows, MD      . magnesium hydroxide (MILK OF MAGNESIA) suspension 30 mL  30 mL Oral Daily PRN Hessie Knows, MD      . menthol-cetylpyridinium (CEPACOL) lozenge 3 mg  1 lozenge Oral PRN Hessie Knows, MD       Or  . phenol (CHLORASEPTIC) mouth spray 1 spray  1 spray Mouth/Throat PRN Hessie Knows, MD      . metoCLOPramide (REGLAN) tablet 5-10 mg  5-10 mg Oral Q8H PRN Hessie Knows, MD       Or  . metoCLOPramide (REGLAN) injection 5-10 mg  5-10 mg Intravenous Q8H PRN Hessie Knows, MD      . morphine 2 MG/ML injection 2 mg  2 mg Intravenous Q1H PRN Hessie Knows, MD      .  ondansetron [facility]) tablet 4 mg  4 mg Oral Q6H PRN Hessie Knows, MD       Or  . ondansetron [facility]) injection 4 mg  4 mg Intravenous Q6H PRN Hessie Knows, MD      . oxyCODONE (Oxy IR/ROXICODONE) immediate release tablet 5-10 mg  5-10 mg Oral Q3H PRN Hessie Knows, MD      . pantoprazole (PROTONIX) EC tablet 40 mg  40 mg Oral BID Hessie Knows, MD      . sertraline (ZOLOFT) tablet 100 mg  100 mg Oral QHS Hessie Knows, MD      . tranexamic acid (CYKLOKAPRON) 1,000 mg in sodium chloride 0.9 % 100 mL IVPB  1,000 mg Intravenous To OR Hessie Knows, MD      . zolpidem Lorrin Mais) tablet 5 mg  5 mg Oral QHS PRN Hessie Knows, MD         Discharge Medications: Please see discharge summary for a list of discharge medications.  Relevant Imaging Results:  Relevant Lab Results:   Additional Information  (SSN: [id number])  Danie Chandler, Student-Social Work

## 2016-07-07 NOTE — H&P (Signed)
Reviewed paper H+P, will be scanned into chart. No changes noted.  

## 2016-07-07 NOTE — Progress Notes (Signed)
Temp remains elevated at 101.1 after having tylenol administered. Dr Roland Rack notified. Received order to to increase tylenol to every 4 hours.

## 2016-07-07 NOTE — Progress Notes (Signed)
PHARMACIST - PHYSICIAN ORDER COMMUNICATION  CONCERNING: P&T Medication Policy on Herbal Medications  DESCRIPTION:  This patient's order for:  Digestive Support Caps and Colon Herbal Cleanser  has been noted.  This product(s) is classified as an "herbal" or natural product. Due to a lack of definitive safety studies or FDA approval, nonstandard manufacturing practices, plus the potential risk of unknown drug-drug interactions while on inpatient medications, the Pharmacy and Therapeutics Committee does not permit the use of "herbal" or natural products of this type within [facility].   ACTION TAKEN: The pharmacy department is unable to verify this order at this time and your patient has been informed of this safety policy. Please reevaluate patient's clinical condition at discharge and address if the herbal or natural product(s) should be resumed at that time.

## 2016-07-07 NOTE — Op Note (Signed)
07/07/2016  12:19 PM  PATIENT:  Caitlyn [name]  50 y.o. female  PRE-OPERATIVE DIAGNOSIS:  primary osteoarhritis right knee  POST-OPERATIVE DIAGNOSIS:  primary osteoarhritis right knee  PROCEDURE:  Procedure(s): TOTAL KNEE ARTHROPLASTY (Right)  SURGEON: Laurene Footman, MD  ASSISTANTS: [facility]  ANESTHESIA:   spinal  EBL:  Total I/O In: 1600 [I.V.:1600] Out: 750 [Urine:700; Blood:50]  BLOOD ADMINISTERED:none  DRAINS: none   LOCAL MEDICATIONS USED:  MARCAINE    and OTHER Exparel, Morphine, Toradol  SPECIMEN:  No Specimen  DISPOSITION OF SPECIMEN:  N/A  COUNTS:  YES  TOURNIQUET:   54 minutes at 300 mmHg  IMPLANTS: Medacta GMK sphere, 2+ right femur, 2 tibia with short stem, 2 mm tibial insert with 2 patella, all components cemented  DICTATION: .Dragon Dictation patient brought the operating room and after adequate spinal anesthesia was obtained the right leg was prepped and draped in sterile fashion. After patient identification and timeout procedures were completed, a midline skin incision was made and tourniquet not raised to bone cuts have been made to make sure adequate hemostasis was achieved during the procedure. A medial parapatellar arthrotomy followed and inspection revealed eburnated bone with extensive bone loss on the medial compartment femur more than tibia as well as extensive patellofemoral degenerative change. The fat pad and anterior cruciate ligament and PCL were excised at this point and proximal tibia alignment guide was placed with a extra medullary tibial guide. The tibial cut was carried out and after resection the meniscal horns were removed. Going to the distal femur a distal femoral drill holes made and intramedullary device used for a 5 distal femur cut. Anterior posterior and chamfer cuts made after sizing this to a 2+ and this point the size 2 tibia trial was placed and appeared to fit well proximal tibial preparation carried out with the  short stem and then the keel punch placed. A 10 mm insert gave good stability with of the 2+ femur. Distal femoral drill holes were made for the femur implant as well as the trochlear groove cut with reamer. All trials were removed and the patella cut using the patellar cutting guide patella was fairly thin only by 22 mm so a minimal resection carried out. 3 drill holes were made and the patella sized to a size 2. At this point the tourniquet been out for some time after the tibial bone cut there was oozing from the bone and so tourniquet was let down hemostasis checked electrocautery was no significant bleeding. Tourniquet was left down his the infiltration of the above medications carried out and in the periarticular tissue. Tourniquet was then raised and the bony surfaces thoroughly irrigated and dried. Tibial component was cemented into place first followed by placement of the polyethylene insert with set screw using the torque screwdriver. Femoral components cemented into place the knee held in extension as the patellar button was clamped in place. After the cemented set excess cement was removed and the knee thoroughly irrigated. Tourniquet is let down and the arthrotomy repaired using a heavy [place]. 3-0 V-loc suture subcutaneously and skin staples followed by Xeroform 4 x 4's ABDs and web roll Polar Care and Ace wrap  PLAN OF CARE: Admit to inpatient   PATIENT DISPOSITION:  PACU - hemodynamically stable.

## 2016-07-07 NOTE — Transfer of Care (Signed)
Immediate Anesthesia Transfer of Care Note  Patient: Caitlyn [name]  Procedure(s) Performed: Procedure(s): TOTAL KNEE ARTHROPLASTY (Right)  Patient Location: PACU  Anesthesia Type:Spinal  Level of Consciousness: sedated  Airway & Oxygen Therapy: Patient Spontanous Breathing and Patient connected to face mask oxygen  Post-op Assessment: Report given to RN and Post -op Vital signs reviewed and stable  Post vital signs: Reviewed and stable  Last Vitals:  Vitals:   07/07/16 0840  BP: (!) 145/75  Pulse: 73  Resp: 16  Temp: 36.9 C    Last Pain:  Vitals:   07/07/16 0840  TempSrc: Oral         Complications: No apparent anesthesia complications

## 2016-07-08 ENCOUNTER — Encounter: Payer: Self-pay | Admitting: Orthopedic Surgery

## 2016-07-08 LAB — CBC
HEMATOCRIT: 32.5 % — AB (ref 35.0–47.0)
HEMOGLOBIN: 10.7 g/dL — AB (ref 12.0–16.0)
MCH: 25.2 pg — ABNORMAL LOW (ref 26.0–34.0)
MCHC: 33 g/dL (ref 32.0–36.0)
MCV: 76.4 fL — ABNORMAL LOW (ref 80.0–100.0)
Platelets: 235 10*3/uL (ref 150–440)
RBC: 4.25 MIL/uL (ref 3.80–5.20)
RDW: 19.4 % — ABNORMAL HIGH (ref 11.5–14.5)
WBC: 18.6 10*3/uL — AB (ref 3.6–11.0)

## 2016-07-08 LAB — BASIC METABOLIC PANEL
ANION GAP: 7 (ref 5–15)
BUN: 11 mg/dL (ref 6–20)
CHLORIDE: 101 mmol/L (ref 101–111)
CO2: 25 mmol/L (ref 22–32)
CREATININE: 0.62 mg/dL (ref 0.44–1.00)
Calcium: 8 mg/dL — ABNORMAL LOW (ref 8.9–10.3)
GFR calc non Af Amer: >60 mL/min (ref >60)
Glucose, Bld: 196 mg/dL — ABNORMAL HIGH (ref 65–99)
Potassium: 4 mmol/L (ref 3.5–5.1)
SODIUM: 133 mmol/L — AB (ref 135–145)

## 2016-07-08 MED ORDER — TRAMADOL HCL 50 MG PO TABS
100.0000 mg | ORAL_TABLET | Freq: Four times a day (QID) | ORAL | Status: DC
  Administered 2016-07-08 – 2016-07-10 (×8): 100 mg via ORAL
  Filled 2016-07-08 (×8): qty 2

## 2016-07-08 NOTE — Progress Notes (Signed)
Clinical Social Worker (CSW) received SNF consult. PT is recommending home health. RN case manager is aware of above. Please reconsult if future social work needs arise. CSW signing off.   Lutie Pickler, LCSW [phone] 

## 2016-07-08 NOTE — Evaluation (Signed)
Occupational Therapy Evaluation Patient Details Name: Caitlyn [name] MRN: [id number] DOB: 06-04-1966 Today's Date: 07/08/2016    History of Present Illness Pt. is a 50 y.o. female who was admitted to [facility] for a RIght TKR.   Clinical Impression   Pt. Is a 50 y.o. Female who was admitted for a right TKR. Pt presents with limited ROM, Pain, weakness, and impaired functional mobility which hinder her ability to complete ADL and IADL tasks. Pt. could benefit from skilled OT services to review A/E use for LE ADLs, to review necessary home modifications, and to improve functional mobility for ADL/IADLs in order to work towards regaining Independence with ADL/IADLs.     Follow Up Recommendations  Home health OT    Equipment Recommendations  Tub/shower seat    Recommendations for Other Services       Precautions / Restrictions Precautions Precautions: Fall;Knee;Other (comment) Precaution Booklet Issued: Yes (comment) Precaution Comments: Reviewed to maintain knee extension when sitting or supine Required Braces or Orthoses: Knee Immobilizer - Right Knee Immobilizer - Right: Other (comment) (no specific orders, able to achieve SLR without lag on eval) Restrictions Weight Bearing Restrictions: Yes RLE Weight Bearing: Weight bearing as tolerated              ADL Overall ADL's : Needs assistance/impaired Eating/Feeding: Set up   Grooming: Set up               Lower Body Dressing: Maximal assistance                 General ADL Comments: Pt. required MaxA for LE ADLs.     Vision     Perception     Praxis      Pertinent Vitals/Pain Pain Assessment: 0-10 Pain Score: 9  Pain Location: Right knee Pain Descriptors / Indicators: Aching;Moaning Pain Intervention(s): Limited activity within patient's tolerance;Monitored during session;Repositioned     Hand Dominance Right   Extremity/Trunk Assessment Upper Extremity Assessment Upper Extremity  Assessment: Overall WFL for tasks assessed         Communication Communication Communication: Prefers language other than [place];Interpreter utilized;Other (comment) (Spanish speaking. Interpreter: Lacretia Leigh)   Cognition Arousal/Alertness: Lethargic Behavior During Therapy: Flat affect Overall Cognitive Status: Within Functional Limits for tasks assessed                   Ronnald Collum from [employer] were present during this initial visit  General Comments       Exercises     Shoulder Instructions      Home Living Family/patient expects to be discharged to:: Private residence Living Arrangements: Spouse/significant other Available Help at Discharge: Family;Available 24 hours/day Type of Home: Apartment Home Access: Stairs to enter Entrance Stairs-Number of Steps: 13 Entrance Stairs-Rails: Left Home Layout: One level     Bathroom Shower/Tub: Tub/shower unit;Curtain Shower/tub characteristics: Architectural technologist: Standard     Home Equipment: Cane - single point          Prior Functioning/Environment Level of Independence: Independent with assistive device(s)        Comments: PTA pt was using cane as needed due to pain        OT Problem List: Decreased strength;Decreased range of motion;Pain;Decreased activity tolerance;Decreased knowledge of use of DME or AE   OT Treatment/Interventions: Self-care/ADL training;Therapeutic exercise;Therapeutic activities;Energy conservation;DME and/or AE instruction;Patient/family education;Manual therapy    OT Goals(Current goals can be found in the care plan section) Acute Rehab OT Goals Patient Stated Goal:  To walk fine  OT Goal Formulation: With patient Potential to Achieve Goals: Good  OT Frequency: Min 1X/week   Barriers to D/C:            Co-evaluation              End of Session    Activity Tolerance: Patient limited by fatigue;Patient limited by pain Patient left:  in chair;with call bell/phone within reach;with chair alarm set   Time: 1050-1110 OT Time Calculation (min): 20 min Charges:  OT General Charges $OT Visit: 1 Procedure OT Evaluation $OT Eval Moderate Complexity: 1 Procedure G-Codes:    Harrel Carina, MS, OTR/L 07/08/2016, 12:03 PM

## 2016-07-08 NOTE — Anesthesia Postprocedure Evaluation (Signed)
Anesthesia Post Note  Patient: Caitlyn [name]  Procedure(s) Performed: Procedure(s) (LRB): TOTAL KNEE ARTHROPLASTY (Right)  Patient location during evaluation: Nursing Unit Anesthesia Type: Spinal Level of consciousness: responds to stimulation Pain management: pain level controlled Vital Signs Assessment: post-procedure vital signs reviewed and stable Respiratory status: spontaneous breathing, nonlabored ventilation and respiratory function stable Cardiovascular status: stable Postop Assessment: no backache, no headache, patient able to bend at knees, no signs of nausea or vomiting and adequate PO intake Anesthetic complications: no    Last Vitals:  Vitals:   07/08/16 0432 07/08/16 0723  BP: (!) 144/71 (!) 103/51  Pulse: 96 72  Resp: 18 16  Temp: 37.3 C 37.1 C    Last Pain:  Vitals:   07/08/16 0723  TempSrc: Oral  PainSc:                  [name]

## 2016-07-08 NOTE — Evaluation (Signed)
Physical Therapy Evaluation Patient Details Name: Caitlyn [name] MRN: [id number] DOB: 07/07/1966 Today's Date: 07/08/2016   History of Present Illness  Pt is a spanish speaking 50 y/o F s/p R TKA.  Pt's PMH includes anemia, anxiety.  Clinical Impression  Pt is s/p R TKA resulting in the deficits listed below (see PT Problem List). Caitlyn [name] was very lethargic today, likely due to pain medication with SpO2 dropping to 87% when on RA.  Due to her lethargy the PT evaluation was limited but pt was able to perform bed mobility and stand pivot transfer with min guard assist.  She declined ambulating at this time as she says, "I do not feel like myself" and expressing constant dizziness.  She is from home with her husband and will have a family member with her 24/7 upon discharge.  She has 13 steps to get to her apartment. Pt will benefit from skilled PT to increase their independence and safety with mobility to allow discharge to the venue listed below.      Follow Up Recommendations Home health PT;Supervision for mobility/OOB    Equipment Recommendations  Rolling walker with 5" wheels    Recommendations for Other Services OT consult     Precautions / Restrictions Precautions Precautions: Fall;Knee;Other (comment) Precaution Booklet Issued: Yes (comment) Precaution Comments: Reviewed to maintain knee extension when sitting or supine Required Braces or Orthoses: Knee Immobilizer - Right Knee Immobilizer - Right: Other (comment) (no specific orders, able to achieve SLR without lag on eval) Restrictions Weight Bearing Restrictions: Yes RLE Weight Bearing: Weight bearing as tolerated      Mobility  Bed Mobility Overal bed mobility: Needs Assistance Bed Mobility: Supine to Sit     Supine to sit: Min guard;HOB elevated     General bed mobility comments: Min guard as pt lethargic and reports feeling "dizzy" despite position.  Pt uses bed rail.  Transfers Overall  transfer level: Needs assistance Equipment used: Rolling walker (2 wheeled) Transfers: Sit to/from [facility] Sit to Stand: Min guard Stand pivot transfers: Min guard       General transfer comment: Close min guard as pt lethargic.  Cues for technique and pt declines ambulating at this time and pivots to chair with flexed posture.  Cues to reach back for armrests to sit.  Ambulation/Gait             General Gait Details: pt declined due to lethargy and not "feeling like myself"  Stairs            Wheelchair Mobility    Modified Rankin (Stroke Patients Only)       Balance Overall balance assessment: Needs assistance Sitting-balance support: Feet supported;Single extremity supported Sitting balance-Leahy Scale: Poor Sitting balance - Comments: At least 1UE supported while sitting EOB as pt lethargic   Standing balance support: During functional activity;Bilateral upper extremity supported Standing balance-Leahy Scale: Poor Standing balance comment: Relies on support from RW to steady                             Pertinent Vitals/Pain Pain Assessment: 0-10 Pain Score: 9  Pain Location: R knee Pain Descriptors / Indicators: Aching;Moaning;Grimacing;Guarding Pain Intervention(s): Limited activity within patient's tolerance;Monitored during session;Repositioned;Premedicated before session;Ice applied (Polar care applied at end of session)    Home Living Family/patient expects to be discharged to:: Private residence Living Arrangements: Spouse/significant other Available Help at Discharge: Family;Available 24 hours/day (nephew will be staying  with pt 24/7 at d/c) Type of Home: Apartment Home Access: Stairs to enter Entrance Stairs-Rails: Left Entrance Stairs-Number of Steps: 13 Home Layout: One level Home Equipment: Cane - single point      Prior Function Level of Independence: Independent with assistive device(s)          Comments: PTA pt was using cane as needed due to pain     Hand Dominance        Extremity/Trunk Assessment   Upper Extremity Assessment: Overall WFL for tasks assessed           Lower Extremity Assessment: RLE deficits/detail RLE Deficits / Details: limited ROM and strength as expected s/p R TKA       Communication   Communication: Prefers language other than [place];Interpreter utilized;Other (comment) (Spanish speaking. Interpreter: Lacretia Leigh)  Cognition Arousal/Alertness: Lethargic;Suspect due to medications Behavior During Therapy: Flat affect Overall Cognitive Status: Within Functional Limits for tasks assessed (although lethargic)                      General Comments General comments (skin integrity, edema, etc.): SpO2 drops to 87% on RA while performing exercises supine in bed and O2 reapplied via [place] at 2L with SpO2 quickly up to mid 90s.    Exercises Total Joint Exercises Ankle Circles/Pumps: AROM;Both;10 reps;Supine Quad Sets: Other (comment) (pt unable to follow commands, language barrier) Straight Leg Raises: Strengthening;Right;10 reps;AAROM;Supine Long Arc Quad: AAROM;Strengthening;Right;10 reps;Seated (with emphasis on eccentric phase) Knee Flexion: AAROM;Right;10 reps;Other (comment);Seated (with 5 second holds) Goniometric ROM: ~80 deg R knee flexion with AAROM   Assessment/Plan    PT Assessment Patient needs continued PT services  PT Problem List Decreased strength;Decreased range of motion;Decreased activity tolerance;Decreased balance;Decreased mobility;Decreased knowledge of use of DME;Decreased safety awareness;Pain;Cardiopulmonary status limiting activity;Decreased knowledge of precautions          PT Treatment Interventions DME instruction;Gait training;Stair training;Functional mobility training;Therapeutic activities;Therapeutic exercise;Balance training;Neuromuscular re-education;Patient/family education;Modalities    PT  Goals (Current goals can be found in the Care Plan section)  Acute Rehab PT Goals Patient Stated Goal: to feel better and go home PT Goal Formulation: With patient/family Time For Goal Achievement: 07/15/16 Potential to Achieve Goals: Good    Frequency BID   Barriers to discharge Inaccessible home environment 13 steps to enter apartment    Co-evaluation               End of Session Equipment Utilized During Treatment: Gait belt;Oxygen Activity Tolerance: Patient limited by lethargy;Patient limited by pain Patient left: in chair;with call bell/phone within reach;Other (comment);with chair alarm set;with family/visitor present (with polar care in place) Nurse Communication: Mobility status;Weight bearing status;Other (comment) (lethargy; SpO2)         Time: [id number] PT Time Calculation (min) (ACUTE ONLY): 37 min   Charges:   PT Evaluation $PT Eval Low Complexity: 1 Procedure PT Treatments $Therapeutic Exercise: 8-22 mins   PT G Codes:        Collie Siad PT, DPT 07/08/2016, 11:04 AM

## 2016-07-08 NOTE — Care Management Note (Signed)
Case Management Note  Patient Details  Name: Caitlyn [name] MRN: [id number] Date of Birth: [birth year]  Subjective/Objective:  POD # 1 right TKA. Met with patient, spouse and spanish interpreter at bedside. Patient is agreeable to home health PT with no agency preference. Referral to [place]. Will need a walker. Ordered from [facility].  Pharmacy: [facility]: [phone]. Called Lovenox 40 mg #14. No refills. Patient will go home with 24 hour care from her family.                    Action/Plan: [facility] for HH PT. Lovenox called in. Walker ordered.   Expected Discharge Date:  07/10/16               Expected Discharge Plan:  [place]  In-House Referral:     Discharge planning Services  CM Consult  Post Acute Care Choice:  Durable Medical Equipment, Home Health Choice offered to:  Patient  DME Arranged:  [facility] DME Agency:  [place] Arranged:    [facility] Agency:  [place]  Status of Service:  In process, will continue to follow  If discussed at Long Length of Stay Meetings, dates discussed:    Additional Comments:  Jolly Mango, RN 07/08/2016, 12:33 PM

## 2016-07-08 NOTE — Progress Notes (Signed)
Pt could not tolerate bone foam. Pain better controlled and pt rested quietly once bone foam was removed. Foley removed at 0450. Encouraged to use incentive spirometer. Pt is afebrile

## 2016-07-08 NOTE — Progress Notes (Signed)
   Subjective: 1 Day Post-Op Procedure(s) (LRB): TOTAL KNEE ARTHROPLASTY (Right) Patient reports pain as 8 on 0-10 scale.   Patient is well, and has had no acute complaints or problems. Nausea last night resolved today. Denies any CP, SOB, ABD pain. We will continue therapy today.  Plan is to go Home after hospital stay.  Objective: Vital signs in last 24 hours: Temp:  [97.6 F (36.4 C)-101.1 F (38.4 C)] 98.8 F (37.1 C) (11/08 0723) Pulse Rate:  [70-96] 72 (11/08 0723) Resp:  [10-22] 16 (11/08 0723) BP: (103-175)/(51-85) 103/51 (11/08 0723) SpO2:  [98 %-100 %] 98 % (11/08 0723) FiO2 (%):  [21 %] 21 % (11/07 1333) Weight:  [81.6 kg (180 lb)] 81.6 kg (180 lb) (11/07 0840)  Intake/Output from previous day: 11/07 0701 - 11/08 0700 In: 2808.3 [P.O.:462; I.V.:2246.3; IV Piggyback:100] Out: 2250 [Urine:2200; Blood:50] Intake/Output this shift: No intake/output data recorded.   Recent Labs  07/07/16 1454 07/08/16 0426  HGB 11.8* 10.7*    Recent Labs  07/07/16 1454 07/08/16 0426  WBC 18.9* 18.6*  RBC 4.73 4.25  HCT 36.5 32.5*  PLT 254 235    Recent Labs  07/07/16 1454 07/08/16 0426  NA  --  133*  K  --  4.0  CL  --  101  CO2  --  25  BUN  --  11  CREATININE 0.48 0.62  GLUCOSE  --  196*  CALCIUM  --  8.0*   No results for input(s): LABPT, INR in the last 72 hours.  EXAM General - Patient is Alert, Appropriate and Oriented Extremity - Neurovascular intact Sensation intact distally Intact pulses distally Dorsiflexion/Plantar flexion intact No cellulitis present Compartment soft Dressing - dressing C/D/I and no drainage Motor Function - intact, moving foot and toes well on exam.   Past Medical History:  Diagnosis Date  . Anemia   . Anxiety   . GERD (gastroesophageal reflux disease)   . Heart burn   . Hypertension     Assessment/Plan:   1 Day Post-Op Procedure(s) (LRB): TOTAL KNEE ARTHROPLASTY (Right) Active Problems:   Primary localized  osteoarthritis of right knee  Estimated body mass index is 30.9 kg/m as calculated from the following:   Height as of this encounter: 5\' 4"  (1.626 m).   Weight as of this encounter: 81.6 kg (180 lb). [facility] diet Up with therapy  Needs BM Recheck labs in the am CM to assist with discharge  DVT Prophylaxis - Lovenox, Foot Pumps and TED hose Weight-Bearing as tolerated to right leg   T. Rachelle Hora, PA-C [place] 07/08/2016, 7:56 AM

## 2016-07-08 NOTE — Progress Notes (Signed)
Physical Therapy Treatment Patient Details Name: Caitlyn [name] MRN: [id number] DOB: 07/19/1966 Today's Date: 07/08/2016    History of Present Illness Pt is a spanish speaking 50 y/o F s/p R TKA.  Pt's PMH includes anemia, anxiety.    PT Comments    Caitlyn [name] made modest progress this afternoon.  She continues to report 9/10 pain but remains lethargic and reports fatigue, falling asleep in the chair when not spoken to.  Therapeutic exercises completed with limitations due to pain but pt agreeable to participate.  Pt ambulated 50 ft using the RW and cues for posture and sequencing technique.  Anticipate that once pt's pain is well controlled and is more awake/alert that her mobility will improve. Pt will benefit from continued skilled PT services to increase functional independence and safety.   Follow Up Recommendations  Home health PT;Supervision for mobility/OOB     Equipment Recommendations  Rolling walker with 5" wheels    Recommendations for Other Services OT consult     Precautions / Restrictions Precautions Precautions: Fall;Knee;Other (comment) Precaution Booklet Issued: Yes (comment) Precaution Comments: Reviewed to maintain knee extension when sitting or supine Required Braces or Orthoses: Knee Immobilizer - Right Knee Immobilizer - Right: Other (comment) (no specific orders, able to achieve SLR without lag on eval) Restrictions Weight Bearing Restrictions: Yes RLE Weight Bearing: Weight bearing as tolerated    Mobility  Bed Mobility Overal bed mobility: Needs Assistance Bed Mobility: Sit to Supine     Supine to sit: Min guard;HOB elevated Sit to supine: Min assist   General bed mobility comments: Min assist to manage RLE and pt requires increased time  Transfers Overall transfer level: Needs assistance Equipment used: Rolling walker (2 wheeled) Transfers: Sit to/from Stand Sit to Stand: Min guard Stand pivot transfers: Min guard        General transfer comment: Close min guard as pt lethargic.  Cues for technique.  Ambulation/Gait Ambulation/Gait assistance: Min guard Ambulation Distance (Feet): 50 Feet Assistive device: Rolling walker (2 wheeled) Gait Pattern/deviations: Step-to pattern;Decreased stance time - right;Decreased weight shift to right;Decreased stride length;Decreased step length - left;Antalgic;Trunk flexed Gait velocity: decreased Gait velocity interpretation: <1.8 ft/sec, indicative of risk for recurrent falls General Gait Details: Cues for upright posture and forward gaze.  Verbal cues for sequencing using RW.  SpO2 remains at or above 97% on 2L O2.     Stairs            Wheelchair Mobility    Modified Rankin (Stroke Patients Only)       Balance Overall balance assessment: Needs assistance Sitting-balance support: No upper extremity supported;Feet supported Sitting balance-Leahy Scale: Fair Sitting balance - Comments: At least 1UE supported while sitting EOB as pt lethargic   Standing balance support: Bilateral upper extremity supported;During functional activity Standing balance-Leahy Scale: Poor Standing balance comment: Relies on RW for support                    Cognition Arousal/Alertness: Lethargic;Suspect due to medications Behavior During Therapy: Flat affect Overall Cognitive Status: Within Functional Limits for tasks assessed                      Exercises Total Joint Exercises Ankle Circles/Pumps: AROM;Both;10 reps;Seated Quad Sets: Strengthening;10 reps;Seated;Both Straight Leg Raises: Strengthening;Right;10 reps;AAROM;Seated Long Arc Quad:  (with emphasis on eccentric phase) Knee Flexion: AAROM;Right;10 reps;Other (comment);Seated (with 5 second holds) Goniometric ROM: ~80 deg R knee flexion with AAROM    General Comments  General comments (skin integrity, edema, etc.): Utilized Interpreter: Caitlyn [name].  [place] off upon PT arrival which pt reports  she did herself, SpO2 down to 88% on RA seated in chair and O2 reapplied at 2L O2.      Pertinent Vitals/Pain Pain Assessment: 0-10 Pain Score: 9  Pain Location: R knee Pain Descriptors / Indicators: Aching;Grimacing;Guarding;Moaning Pain Intervention(s): Limited activity within patient's tolerance;Monitored during session;Repositioned;Ice applied (Polar ice applied at end of session)    Home Living Family/patient expects to be discharged to:: Private residence Living Arrangements: Spouse/significant other Available Help at Discharge: Family;Available 24 hours/day Type of Home: Apartment Home Access: Stairs to enter Entrance Stairs-Rails: Left Home Layout: One level Home Equipment: Cane - single point      Prior Function Level of Independence: Independent with assistive device(s)      Comments: PTA pt was using cane as needed due to pain   PT Goals (current goals can now be found in the care plan section) Acute Rehab PT Goals Patient Stated Goal: decreased pain PT Goal Formulation: With patient/family Time For Goal Achievement: 07/15/16 Potential to Achieve Goals: Good Progress towards PT goals: Progressing toward goals    Frequency    BID      PT Plan Current plan remains appropriate    Co-evaluation             End of Session Equipment Utilized During Treatment: Gait belt;Oxygen Activity Tolerance: Patient limited by lethargy;Patient limited by pain Patient left: with call bell/phone within reach;Other (comment);in bed;with bed alarm set;with SCD's reapplied (with polar care and bone foam in place)     Time: [id number] PT Time Calculation (min) (ACUTE ONLY): 33 min  Charges:  $Gait Training: 8-22 mins $Therapeutic Exercise: 8-22 mins                    G Codes:      Collie Siad PT, DPT 07/08/2016, 2:42 PM

## 2016-07-09 LAB — CBC
HCT: 30.5 % — ABNORMAL LOW (ref 35.0–47.0)
Hemoglobin: 10.2 g/dL — ABNORMAL LOW (ref 12.0–16.0)
MCH: 25.5 pg — ABNORMAL LOW (ref 26.0–34.0)
MCHC: 33.4 g/dL (ref 32.0–36.0)
MCV: 76.5 fL — ABNORMAL LOW (ref 80.0–100.0)
Platelets: 205 10*3/uL (ref 150–440)
RBC: 3.99 MIL/uL (ref 3.80–5.20)
RDW: 18.9 % — ABNORMAL HIGH (ref 11.5–14.5)
WBC: 16 10*3/uL — ABNORMAL HIGH (ref 3.6–11.0)

## 2016-07-09 LAB — BASIC METABOLIC PANEL
ANION GAP: 8 (ref 5–15)
BUN: 8 mg/dL (ref 6–20)
CHLORIDE: 98 mmol/L — AB (ref 101–111)
CO2: 26 mmol/L (ref 22–32)
Calcium: 8.4 mg/dL — ABNORMAL LOW (ref 8.9–10.3)
Creatinine, Ser: 0.4 mg/dL — ABNORMAL LOW (ref 0.44–1.00)
GFR calc Af Amer: >60 mL/min (ref >60)
Glucose, Bld: 143 mg/dL — ABNORMAL HIGH (ref 65–99)
POTASSIUM: 3.8 mmol/L (ref 3.5–5.1)
SODIUM: 132 mmol/L — AB (ref 135–145)

## 2016-07-09 MED ORDER — OXYCODONE HCL 5 MG PO TABS: 5.0000 mg | ORAL_TABLET | ORAL | 0 refills | Status: DC | PRN

## 2016-07-09 MED ORDER — TRAMADOL HCL 50 MG PO TABS: 50.0000 mg | ORAL_TABLET | Freq: Four times a day (QID) | ORAL | 1 refills | Status: DC | PRN

## 2016-07-09 MED ORDER — ENOXAPARIN SODIUM 30 MG/0.3ML ~~LOC~~ SOLN: 30.0000 mg | Freq: Two times a day (BID) | SUBCUTANEOUS | 0 refills | Status: DC

## 2016-07-09 NOTE — Progress Notes (Signed)
Physical Therapy Treatment Patient Details Name: Caitlyn [name] MRN: [id number] DOB: 1966-06-15 Today's Date: 07/09/2016    History of Present Illness Pt is a spanish speaking 50 y/o F s/p R TKA.  Pt's PMH includes anemia, anxiety.    PT Comments    Caitlyn [name] is making good progress, ambulating 100 ft but requiring 2 standing rest breaks due to fatigue which clears with rest.  She requires max encouragement when completing R knee flexion exercises, continuing to report 9/10 pain which is her main limiting factor. Pt will benefit from continued skilled PT services to increase functional independence and safety.   Follow Up Recommendations  Home health PT;Supervision for mobility/OOB     Equipment Recommendations  Rolling walker with 5" wheels    Recommendations for Other Services       Precautions / Restrictions Precautions Precautions: Fall;Knee;Other (comment) Precaution Comments: Reviewed to maintain knee extension when sitting or supine Required Braces or Orthoses: Knee Immobilizer - Right Knee Immobilizer - Right: Other (comment) (no specific orders, able to achieve SLR without lag on eval) Restrictions Weight Bearing Restrictions: Yes RLE Weight Bearing: Weight bearing as tolerated    Mobility  Bed Mobility Overal bed mobility: Needs Assistance Bed Mobility: Supine to Sit     Supine to sit: Min guard;HOB elevated     General bed mobility comments: Pt uses leg hook technique to bring RLE OOB.  She relies heavily on the bed rail to pull up to sitting with increased time and effort.  Transfers Overall transfer level: Needs assistance Equipment used: Rolling walker (2 wheeled) Transfers: Sit to/from Stand Sit to Stand: Min guard         General transfer comment: Cues for technique as pt initially standing with Bil hands on RW.  Cues to back up all the way to the chair prior to attempting to sit.  Ambulation/Gait Ambulation/Gait assistance: Min  guard Ambulation Distance (Feet): 100 Feet Assistive device: Rolling walker (2 wheeled) Gait Pattern/deviations: Step-to pattern;Decreased stride length;Decreased stance time - right;Decreased step length - left;Decreased weight shift to right;Antalgic Gait velocity: decreased Gait velocity interpretation: Below normal speed for age/gender General Gait Details: Cues for upright posture and forward gaze as well as to relax shoulders.  Verbal cues for sequencing using RW.  SpO2 94% on RA.  Pt requires 2 standing rest breaks due to fatigue and mild dizziness which clears with rest.   Stairs            Wheelchair Mobility    Modified Rankin (Stroke Patients Only)       Balance Overall balance assessment: Needs assistance Sitting-balance support: No upper extremity supported;Feet supported Sitting balance-Leahy Scale: Good     Standing balance support: Bilateral upper extremity supported;During functional activity Standing balance-Leahy Scale: Poor Standing balance comment: Relies on RW for support                    Cognition Arousal/Alertness: Awake/alert Behavior During Therapy: WFL for tasks assessed/performed Overall Cognitive Status: Within Functional Limits for tasks assessed                      Exercises Total Joint Exercises Ankle Circles/Pumps: AROM;Both;10 reps;Supine Quad Sets: Strengthening;Both;15 reps;Supine Heel Slides: AAROM;Right;10 reps;Seated Long Arc Quad: Strengthening;AAROM;Right;10 reps;Seated;Other (comment) (with emphasis on eccentric phase) Knee Flexion: AAROM;Right;10 reps;Other (comment);Seated (with 5 second holds) Goniometric ROM: ~85 deg R knee flexion with AAROM    General Comments General comments (skin integrity, edema, etc.): Utilized Interpretor:  Pertinent Vitals/Pain Pain Assessment: 0-10 Pain Score: 9  Pain Location: R knee Pain Descriptors / Indicators: Aching;Grimacing;Guarding;Moaning Pain  Intervention(s): Limited activity within patient's tolerance;Monitored during session;Repositioned;Ice applied    Home Living                      Prior Function            PT Goals (current goals can now be found in the care plan section) Acute Rehab PT Goals Patient Stated Goal: decreased pain PT Goal Formulation: With patient/family Time For Goal Achievement: 07/15/16 Potential to Achieve Goals: Good Progress towards PT goals: Progressing toward goals    Frequency    BID      PT Plan Current plan remains appropriate    Co-evaluation             End of Session Equipment Utilized During Treatment: Gait belt Activity Tolerance: Patient limited by lethargy;Patient limited by pain Patient left: with call bell/phone within reach;Other (comment);in chair;with chair alarm set;with family/visitor present (with polar care )     Time: [id number] PT Time Calculation (min) (ACUTE ONLY): 41 min  Charges:  $Gait Training: 8-22 mins $Therapeutic Exercise: 23-37 mins                    G Codes:       Collie Siad PT, DPT 07/09/2016, 10:24 AM

## 2016-07-09 NOTE — Progress Notes (Signed)
[place] making rounds visited Pt. Pt was with her family members in the room at the time of this visit. [place] greeted the Pt, who spoke Spanish and talked to the Pt and her family briefly through a translator to let Pt know that the [facility] was available, and then [facility] left.     07/09/16 1500  Clinical Encounter Type  Visited With Patient;Patient and family together  Visit Type Initial  Spiritual Encounters  Spiritual Needs Prayer;Other (Comment)

## 2016-07-09 NOTE — Progress Notes (Addendum)
Physical Therapy Treatment Patient Details Name: Caitlyn [name] MRN: [id number] DOB: Oct 14, 1965 Today's Date: 07/09/2016    History of Present Illness Pt is a spanish speaking 50 y/o F s/p R TKA.  Pt's PMH includes anemia, anxiety.    PT Comments    Tata made good progress this afternoon, ambulating 300 ft using her RW and cues for step through gait pattern.  She requires assist with LAQ and knee flexion due to pain but reports she has been continuing her exercises during the day.  Pt will benefit from continued skilled PT services to increase functional independence and safety.   Follow Up Recommendations  Home health PT;Supervision for mobility/OOB     Equipment Recommendations  Rolling walker with 5" wheels    Recommendations for Other Services       Precautions / Restrictions Precautions Precautions: Fall;Knee Precaution Comments: Reviewed to maintain knee extension when sitting or supine Required Braces or Orthoses: Knee Immobilizer - Right Knee Immobilizer - Right: Other (comment) (no specific orders, able to achieve SLR without lag on eval) Restrictions Weight Bearing Restrictions: Yes RLE Weight Bearing: Weight bearing as tolerated    Mobility  Bed Mobility Overal bed mobility: Needs Assistance Bed Mobility: Supine to Sit     Supine to sit: Min guard;HOB elevated     General bed mobility comments: Pt uses leg hook technique to bring RLE OOB.  Increased time and effort.  Transfers Overall transfer level: Needs assistance Equipment used: Rolling walker (2 wheeled) Transfers: Sit to/from Stand Sit to Stand: Min guard         General transfer comment: Proper technique to achieve sit>stand.  Cues to back up all the way to the chair prior to attempting to sit.  Ambulation/Gait Ambulation/Gait assistance: Min guard Ambulation Distance (Feet): 300 Feet Assistive device: Rolling walker (2 wheeled) Gait Pattern/deviations: Step-to  pattern;Step-through pattern;Decreased stride length;Decreased weight shift to right;Decreased stance time - right;Decreased step length - left;Antalgic;Trunk flexed Gait velocity: decreased Gait velocity interpretation: Below normal speed for age/gender General Gait Details: Cues for upright posture and forward gaze as well as to relax shoulders.  Verbal cues for step through gait pattern which pt is able to perform with consistent reminders.  3 standing rest breaks due to fatigue.   Stairs            Wheelchair Mobility    Modified Rankin (Stroke Patients Only)       Balance Overall balance assessment: Needs assistance Sitting-balance support: No upper extremity supported;Feet supported Sitting balance-Leahy Scale: Good     Standing balance support: During functional activity;Bilateral upper extremity supported Standing balance-Leahy Scale: Poor Standing balance comment: Relies on RW for support                    Cognition Arousal/Alertness: Awake/alert Behavior During Therapy: WFL for tasks assessed/performed Overall Cognitive Status: Within Functional Limits for tasks assessed                      Exercises Total Joint Exercises Quad Sets: Strengthening;Both;10 reps;Seated Straight Leg Raises: Strengthening;AAROM;Right;10 reps;Seated Long Arc Quad: Strengthening;AAROM;Right;10 reps;Seated;Other (comment) (with emphasis on eccentric phase) Knee Flexion: AAROM;Right;10 reps;Other (comment);Seated (with 5 second holds) Goniometric ROM: ~85 deg R knee flexion with AAROM    General Comments General comments (skin integrity, edema, etc.): Utilized Interpretor: Jacqui Laukaitis      Pertinent Vitals/Pain Pain Assessment: Faces Faces Pain Scale: Hurts whole lot Pain Location: R knee, especially with R knee flexion  Pain Descriptors / Indicators: Aching;Grimacing;Guarding;Moaning Pain Intervention(s): Limited activity within patient's  tolerance;Monitored during session;Repositioned    Home Living                      Prior Function            PT Goals (current goals can now be found in the care plan section) Acute Rehab PT Goals Patient Stated Goal: decreased pain PT Goal Formulation: With patient/family Time For Goal Achievement: 07/15/16 Potential to Achieve Goals: Good Progress towards PT goals: Progressing toward goals    Frequency    BID      PT Plan Current plan remains appropriate    Co-evaluation             End of Session Equipment Utilized During Treatment: Gait belt Activity Tolerance: Patient limited by pain Patient left: with call bell/phone within reach;Other (comment);in chair;with chair alarm set;with family/visitor present (with polar care in place)     Time: 1353-1436 PT Time Calculation (min) (ACUTE ONLY): 43 min  Charges:  $Gait Training: 23-37 mins $Therapeutic Exercise: 8-22 mins                    G Codes:      Collie Siad PT, DPT 07/09/2016, 2:55 PM

## 2016-07-09 NOTE — Discharge Instructions (Signed)

## 2016-07-09 NOTE — Progress Notes (Signed)
   Subjective: 2 Days Post-Op Procedure(s) (LRB): TOTAL KNEE ARTHROPLASTY (Right) Patient reports pain as mild to moderate   Patient is well, and has had no acute complaints or problems.  Denies any CP, SOB, ABD pain. We will continue therapy today.  Plan is to go Home after hospital stay. The plan is for tomorrow.  Objective: Vital signs in last 24 hours: Temp:  [98.6 F (37 C)-99.1 F (37.3 C)] 98.6 F (37 C) (11/09 0455) Pulse Rate:  [72-95] 95 (11/09 0455) Resp:  [16-19] 18 (11/09 0455) BP: (103-185)/(51-82) 167/70 (11/09 0455) SpO2:  [82 %-100 %] 90 % (11/09 0455)  Intake/Output from previous day: 11/08 0701 - 11/09 0700 In: 480 [P.O.:480] Out: 1100 [Urine:1100] Intake/Output this shift: Total I/O In: -  Out: 550 [Urine:550]   Recent Labs  07/07/16 1454 07/08/16 0426 07/09/16 0403  HGB 11.8* 10.7* 10.2*    Recent Labs  07/08/16 0426 07/09/16 0403  WBC 18.6* 16.0*  RBC 4.25 3.99  HCT 32.5* 30.5*  PLT 235 205    Recent Labs  07/08/16 0426 07/09/16 0403  NA 133* 132*  K 4.0 3.8  CL 101 98*  CO2 25 26  BUN 11 8  CREATININE 0.62 0.40*  GLUCOSE 196* 143*  CALCIUM 8.0* 8.4*   No results for input(s): LABPT, INR in the last 72 hours.  EXAM General - Patient is Alert, Appropriate and Oriented Extremity - Neurovascular intact Sensation intact distally Intact pulses distally Dorsiflexion/Plantar flexion intact No cellulitis present Compartment soft Dressing - The dressing was changed to a honeycomb dressing. There was no active drainage. Motor Function - intact, moving foot and toes well on exam. The patient ambulated 50 feet with physical therapy.  Past Medical History:  Diagnosis Date  . Anemia   . Anxiety   . GERD (gastroesophageal reflux disease)   . Heart burn   . Hypertension     Assessment/Plan:   2 Days Post-Op Procedure(s) (LRB): TOTAL KNEE ARTHROPLASTY (Right) Active Problems:   Primary localized osteoarthritis of right  knee  Estimated body mass index is 30.9 kg/m as calculated from the following:   Height as of this encounter: 5\' 4"  (1.626 m).   Weight as of this encounter: 81.6 kg (180 lb). Advance diet Up with therapy  Needs BM CM to assist with discharge for Friday.  DVT Prophylaxis - Lovenox, Foot Pumps and TED hose Weight-Bearing as tolerated to right leg   Reche Dixon, PA-C [place] 07/09/2016, 6:26 AM

## 2016-07-10 NOTE — Progress Notes (Signed)
Physical Therapy Treatment Patient Details Name: Caitlyn [name] MRN: [id number] DOB: 1965-11-17 Today's Date: 07/10/2016    History of Present Illness Pt is a spanish speaking 50 y/o F s/p R TKA.  Pt's PMH includes anemia, anxiety.    PT Comments    Caitlyn [name] made good progress today, completing stair training with min assist from this PT and her husband.  All questions answered.  She completed therapeutic exercises without issue.  Suggested to pt and husband to temporarily put away any throw rugs and to rearrange furniture if needed prior to d/c for ease of navigating with RW and to decrease risk of falls.  Pt and husband verbalized understanding and appreciative.     Follow Up Recommendations  Home health PT;Supervision for mobility/OOB     Equipment Recommendations  Rolling walker with 5" wheels    Recommendations for Other Services       Precautions / Restrictions Precautions Precautions: Fall;Knee Precaution Comments: Reviewed to maintain knee extension when sitting or supine Required Braces or Orthoses: Knee Immobilizer - Right Knee Immobilizer - Right: Other (comment) (no specific orders, able to achieve SLR without lag on eval) Restrictions Weight Bearing Restrictions: Yes RLE Weight Bearing: Weight bearing as tolerated    Mobility  Bed Mobility               General bed mobility comments: Pt on BSC upon PT arrival  Transfers Overall transfer level: Needs assistance Equipment used: Rolling walker (2 wheeled) Transfers: Sit to/from Stand Sit to Stand: Supervision         General transfer comment: Supervision for safety as pt uses correct technique.  Ambulation/Gait Ambulation/Gait assistance: Supervision Ambulation Distance (Feet): 80 Feet Assistive device: Rolling walker (2 wheeled) Gait Pattern/deviations: Step-to pattern;Step-through pattern;Decreased stride length;Decreased weight shift to right;Decreased stance time - right;Decreased step  length - left;Antalgic Gait velocity: decreased Gait velocity interpretation: Below normal speed for age/gender General Gait Details: Cues for upright posture and to relax shoulders.  Pt initially with step to pattern and transitions to step through gait pattern without verbal cues.   Stairs Stairs: Yes Stairs assistance: Min assist Stair Management: One rail Left;No rails;Backwards;Step to pattern;With walker Number of Stairs: 8 General stair comments: This PT assisted pt with holding RW in place as pt ascended and descended first 6 steps.  Pt holding onto railing between steps as this PT moved RW up/down.  Husband took over role of holding RW for the last 2 steps with education provided by this PT and cues for communication between pt and husband to be sure both are ready before the pt steps.    Wheelchair Mobility    Modified Rankin (Stroke Patients Only)       Balance Overall balance assessment: Needs assistance Sitting-balance support: No upper extremity supported;Feet supported Sitting balance-Leahy Scale: Good     Standing balance support: Bilateral upper extremity supported;During functional activity Standing balance-Leahy Scale: Poor Standing balance comment: Relies on RW for support                    Cognition Arousal/Alertness: Awake/alert Behavior During Therapy: WFL for tasks assessed/performed Overall Cognitive Status: Within Functional Limits for tasks assessed                      Exercises Total Joint Exercises Straight Leg Raises: Strengthening;AAROM;Right;10 reps;Seated Long Arc Quad: Strengthening;AAROM;Right;10 reps;Seated;Other (comment) (with emphasis on eccentric phase) Knee Flexion: AAROM;Right;10 reps;Other (comment);Seated (with 5 second holds) Goniometric ROM: ~90 deg  R knee flexion with AAROM    General Comments General comments (skin integrity, edema, etc.): Utilized Interpreters: Caitlyn [name] and Caitlyn [name]        Pertinent Vitals/Pain Pain Assessment: 0-10 Pain Score: 9  Pain Location: R knee Pain Descriptors / Indicators: Aching;Discomfort;Grimacing;Guarding;Moaning Pain Intervention(s): Limited activity within patient's tolerance;Monitored during session;Repositioned;Ice applied (Polar care applied at end of session)    Home Living                      Prior Function            PT Goals (current goals can now be found in the care plan section) Acute Rehab PT Goals Patient Stated Goal: decreased pain PT Goal Formulation: With patient/family Time For Goal Achievement: 07/15/16 Potential to Achieve Goals: Good Progress towards PT goals: Progressing toward goals    Frequency    BID      PT Plan Current plan remains appropriate    Co-evaluation             End of Session Equipment Utilized During Treatment: Gait belt Activity Tolerance: Patient limited by pain Patient left: with call bell/phone within reach;Other (comment);in chair;with family/visitor present;with chair alarm set (with polar care in place)     Time: [id number] PT Time Calculation (min) (ACUTE ONLY): 44 min  Charges:  $Gait Training: 23-37 mins $Therapeutic Exercise: 8-22 mins                    G Codes:       Collie Siad PT, DPT 07/10/2016, 10:40 AM

## 2016-07-10 NOTE — Progress Notes (Signed)
Caitlyn [name] to be D/C'd Home per MD order.  Discussed with the patient and all questions fully answered.  VSS, Skin clean, dry and intact without evidence of skin break down, no evidence of skin tears noted. IV catheter discontinued intact. Site without signs and symptoms of complications. Dressing and pressure applied.  An After Visit Summary was printed and given to the patient. Patient received prescription.  D/c education completed with patient/family including follow up instructions, medication list, d/c activities limitations if indicated, with other d/c instructions as indicated by MD - patient able to verbalize understanding, all questions fully answered.   Patient instructed to return to ED, call 911, or call MD for any changes in condition.   Patient escorted via [place], and D/C home via private auto.  Deri Fuelling 07/10/2016 1:37 PM

## 2016-07-10 NOTE — Discharge Summary (Signed)
Physician Discharge Summary  Subjective: 3 Days Post-Op Procedure(s) (LRB): TOTAL KNEE ARTHROPLASTY (Right) Patient reports pain as mild.   Patient seen in rounds with Dr. Rudene Christians. Patient is well, and has had no acute complaints or problems Patient is ready to go HomeWith home health physical therapy  Physician Discharge Summary  Patient ID: Caitlyn [name] MRN: [id number] DOB/AGE: 1966/08/18 50 y.o.  Admit date: 07/07/2016 Discharge date: 07/10/2016  Admission Diagnoses:  Discharge Diagnoses:  Active Problems:   Primary localized osteoarthritis of right knee   Discharged Condition: good  Hospital Course: The patient is postop day 3 from a right total knee replacement. She has done very well with physical therapy. She has ambulated up to 300 feet around the nurse's station. Her pain level has become more manageable. She has stable labs. She has not had a bowel movement, but is continuing to work on this before discharge.  Treatments: surgery:  TOTAL KNEE ARTHROPLASTY (Right)  SURGEON: Laurene Footman, MD  ASSISTANTS: Rachelle Hora [facility]  ANESTHESIA:   spinal  EBL:  Total I/O In: 1600 [I.V.:1600] Out: 750 [Urine:700; Blood:50]  BLOOD ADMINISTERED:none  DRAINS: none   LOCAL MEDICATIONS USED:  MARCAINE    and OTHER Exparel, Morphine, Toradol  SPECIMEN:  No Specimen  DISPOSITION OF SPECIMEN:  N/A  COUNTS:  YES  TOURNIQUET:   54 minutes at 300 mmHg  IMPLANTS: Medacta GMK sphere, 2+ right femur, 2 tibia with short stem, 2 mm tibial insert with 2 patella, all components cemented  Discharge Exam: Blood pressure (!) 118/56, pulse 91, temperature 99.8 F (37.7 C), temperature source Oral, resp. rate 16, height 5\' 4"  (1.626 m), weight 81.6 kg (180 lb), last menstrual period 06/07/2016, SpO2 95 %.   Disposition: Final discharge disposition not confirmed     Medication List    TAKE these medications   COLON HERBAL CLEANSER PO Take 1 tablet by mouth  at bedtime.   DIGESTIVE SUPPORT PO Take 1 tablet by mouth daily.   docusate sodium 100 MG capsule Commonly known as:  COLACE Take 100 mg by mouth 2 (two) times daily.   enoxaparin 30 MG/0.3ML injection Commonly known as:  LOVENOX Inject 0.3 mLs (30 mg total) into the skin every 12 (twelve) hours.   ferrous sulfate 325 (65 FE) MG tablet Take 325 mg by mouth daily with breakfast.   lisinopril 10 MG tablet Commonly known as:  PRINIVIL,ZESTRIL Take 10 mg by mouth daily.   omeprazole 20 MG capsule Commonly known as:  PRILOSEC Take 20 mg by mouth daily as needed.   oxyCODONE 5 MG immediate release tablet Commonly known as:  Oxy IR/ROXICODONE Take 1-2 tablets (5-10 mg total) by mouth every 4 (four) hours as needed for breakthrough pain.   sertraline 50 MG tablet Commonly known as:  ZOLOFT Take 100 mg by mouth at bedtime as needed.   traMADol 50 MG tablet Commonly known as:  ULTRAM Take 1 tablet (50 mg total) by mouth every 6 (six) hours as needed.   zolpidem 10 MG tablet Commonly known as:  AMBIEN Take 10 mg by mouth at bedtime as needed for sleep.            Durable Medical Equipment        Start     Ordered   07/07/16 1333  DME Walker rolling  Once     07/07/16 1332   07/07/16 1333  DME 3 n 1  Once     07/07/16 1332   07/07/16 1333  DME Bedside commode  Once     07/07/16 1332     Follow-up Information    MENZ,MICHAEL, MD Follow up in 2 week(s).   Specialty:  Orthopedic Surgery Why:  For staple removal Contact information: [place] [address] [phone]           Signed: Prescott Parma, Jaquelyn Sakamoto 07/10/2016, 6:11 AM   Objective: Vital signs in last 24 hours: Temp:  [99.2 F (37.3 C)-99.8 F (37.7 C)] 99.8 F (37.7 C) (11/10 0456) Pulse Rate:  [91-98] 91 (11/10 0456) Resp:  [16] 16 (11/10 0456) BP: (118-160)/(56-79) 118/56 (11/10 0456) SpO2:  [87 %-95 %] 95 % (11/10 0456)  Intake/Output from  previous day:  Intake/Output Summary (Last 24 hours) at 07/10/16 0611 Last data filed at 07/10/16 0456  Gross per 24 hour  Intake             1200 ml  Output                0 ml  Net             1200 ml    Intake/Output this shift: No intake/output data recorded.  Labs:  Recent Labs  07/07/16 1454 07/08/16 0426 07/09/16 0403  HGB 11.8* 10.7* 10.2*    Recent Labs  07/08/16 0426 07/09/16 0403  WBC 18.6* 16.0*  RBC 4.25 3.99  HCT 32.5* 30.5*  PLT 235 205    Recent Labs  07/08/16 0426 07/09/16 0403  NA 133* 132*  K 4.0 3.8  CL 101 98*  CO2 25 26  BUN 11 8  CREATININE 0.62 0.40*  GLUCOSE 196* 143*  CALCIUM 8.0* 8.4*   No results for input(s): LABPT, INR in the last 72 hours.  EXAM: General - Patient is Alert and Oriented Extremity - Dorsiflexion/Plantar flexion intact No cellulitis present Compartment soft Incision - clean, dry, no drainage Motor Function -  plantarflexion and dorsiflexion of both feet. She ambulated 300 feet with physical therapy.  Assessment/Plan: 3 Days Post-Op Procedure(s) (LRB): TOTAL KNEE ARTHROPLASTY (Right) Procedure(s) (LRB): TOTAL KNEE ARTHROPLASTY (Right) Past Medical History:  Diagnosis Date  . Anemia   . Anxiety   . GERD (gastroesophageal reflux disease)   . Heart burn   . Hypertension    Active Problems:   Primary localized osteoarthritis of right knee  Estimated body mass index is 30.9 kg/m as calculated from the following:   Height as of this encounter: 5\' 4"  (1.626 m).   Weight as of this encounter: 81.6 kg (180 lb). Discharge home with home health Diet - Regular diet Follow up - in 2 weeks Activity - WBAT Disposition - Home Condition Upon Discharge - Good DVT Prophylaxis - Lovenox and TED hose  Reche Dixon, PA-C Orthopaedic Surgery 07/10/2016, 6:11 AM

## 2016-07-10 NOTE — Progress Notes (Signed)
   Subjective: 3 Days Post-Op Procedure(s) (LRB): TOTAL KNEE ARTHROPLASTY (Right) Patient reports pain as mild to moderate   Patient is well, and has had no acute complaints or problems.  Denies any CP, SOB, ABD pain. We will continue therapy today.  Plan is to go Home after hospital stay. The plan is for today.  Objective: Vital signs in last 24 hours: Temp:  [99.2 F (37.3 C)-99.8 F (37.7 C)] 99.8 F (37.7 C) (11/10 0456) Pulse Rate:  [91-98] 91 (11/10 0456) Resp:  [16] 16 (11/10 0456) BP: (118-160)/(56-79) 118/56 (11/10 0456) SpO2:  [87 %-95 %] 95 % (11/10 0456)  Intake/Output from previous day: 11/09 0701 - 11/10 0700 In: 1200 [P.O.:1200] Out: 0  Intake/Output this shift: No intake/output data recorded.   Recent Labs  07/07/16 1454 07/08/16 0426 07/09/16 0403  HGB 11.8* 10.7* 10.2*    Recent Labs  07/08/16 0426 07/09/16 0403  WBC 18.6* 16.0*  RBC 4.25 3.99  HCT 32.5* 30.5*  PLT 235 205    Recent Labs  07/08/16 0426 07/09/16 0403  NA 133* 132*  K 4.0 3.8  CL 101 98*  CO2 25 26  BUN 11 8  CREATININE 0.62 0.40*  GLUCOSE 196* 143*  CALCIUM 8.0* 8.4*   No results for input(s): LABPT, INR in the last 72 hours.  EXAM General - Patient is Alert, Appropriate and Oriented Extremity - Neurovascular intact Sensation intact distally Intact pulses distally Dorsiflexion/Plantar flexion intact No cellulitis present Compartment soft Dressing - Clean dry and intact. Motor Function - intact, moving foot and toes well on exam. The patient ambulated 300 feet with physical therapy.  Past Medical History:  Diagnosis Date  . Anemia   . Anxiety   . GERD (gastroesophageal reflux disease)   . Heart burn   . Hypertension     Assessment/Plan:   3 Days Post-Op Procedure(s) (LRB): TOTAL KNEE ARTHROPLASTY (Right) Active Problems:   Primary localized osteoarthritis of right knee  Estimated body mass index is 30.9 kg/m as calculated from the following:  Height as of this encounter: 5\' 4"  (1.626 m).   Weight as of this encounter: 81.6 kg (180 lb). Advance diet Up with therapy  Needs BM before discharge today. CM to assist with discharge.  DVT Prophylaxis - Lovenox, Foot Pumps and TED hose Weight-Bearing as tolerated to right leg   Reche Dixon, PA-C [place] 07/10/2016, 6:09 AM

## 2016-09-28 ENCOUNTER — Other Ambulatory Visit: Payer: Self-pay | Admitting: Family Medicine

## 2016-09-28 DIAGNOSIS — Z1231 Encounter for screening mammogram for malignant neoplasm of breast: Secondary | ICD-10-CM

## 2016-10-28 ENCOUNTER — Other Ambulatory Visit: Payer: Self-pay | Admitting: Orthopedic Surgery

## 2016-10-28 DIAGNOSIS — M1712 Unilateral primary osteoarthritis, left knee: Secondary | ICD-10-CM

## 2016-10-29 ENCOUNTER — Ambulatory Visit
Admission: RE | Admit: 2016-10-29 | Discharge: 2016-10-29 | Disposition: A | Discharge status: Home / Self Care | Payer: BLUE CROSS/BLUE SHIELD | Source: Ambulatory Visit | Attending: Orthopedic Surgery | Admitting: Orthopedic Surgery

## 2016-10-29 DIAGNOSIS — M1712 Unilateral primary osteoarthritis, left knee: Secondary | ICD-10-CM | POA: Diagnosis present

## 2016-11-04 ENCOUNTER — Ambulatory Visit: Payer: BLUE CROSS/BLUE SHIELD | Attending: Family Medicine

## 2016-11-26 ENCOUNTER — Encounter
Admission: RE | Admit: 2016-11-26 | Discharge: 2016-11-26 | Disposition: A | Discharge status: Home / Self Care | Payer: BLUE CROSS/BLUE SHIELD | Source: Ambulatory Visit | Attending: Orthopedic Surgery | Admitting: Orthopedic Surgery

## 2016-11-26 ENCOUNTER — Inpatient Hospital Stay: Admission: RE | Admit: 2016-11-26 | Payer: BLUE CROSS/BLUE SHIELD | Source: Ambulatory Visit

## 2016-11-26 DIAGNOSIS — Z01812 Encounter for preprocedural laboratory examination: Secondary | ICD-10-CM | POA: Insufficient documentation

## 2016-11-26 HISTORY — DX: Unspecified osteoarthritis, unspecified site: M19.90

## 2016-11-26 HISTORY — DX: Major depressive disorder, single episode, unspecified: F32.9

## 2016-11-26 HISTORY — DX: Depression, unspecified: F32.A

## 2016-11-26 LAB — CBC
HCT: 34 % — ABNORMAL LOW (ref 35.0–47.0)
HEMOGLOBIN: 10.9 g/dL — AB (ref 12.0–16.0)
MCH: 23.6 pg — ABNORMAL LOW (ref 26.0–34.0)
MCHC: 32.1 g/dL (ref 32.0–36.0)
MCV: 73.4 fL — ABNORMAL LOW (ref 80.0–100.0)
PLATELETS: 372 10*3/uL (ref 150–440)
RBC: 4.63 MIL/uL (ref 3.80–5.20)
RDW: 15 % — ABNORMAL HIGH (ref 11.5–14.5)
WBC: 12.2 10*3/uL — ABNORMAL HIGH (ref 3.6–11.0)

## 2016-11-26 NOTE — Patient Instructions (Signed)
Your procedure is scheduled on: December 03, 2016 (Thursday) Su procedimiento est programado para: Report to Same Day Surgery. Presntese a: To find out your arrival time please call [phone] between 1PM - 3PM on December 02, 2016 (Wednesday) Para saber su hora de [place] por favor llame al ([place]:  Remember: Instructions that are not followed completely may result in serious medical risk, up to and including death, or upon the discretion of your surgeon and anesthesiologist your surgery may need to be rescheduled.  Recuerde: Las instrucciones que no se siguen completamente Heritage manager en un riesgo de salud grave, incluyendo hasta la [place] o a discrecin de su cirujano y Environmental health practitioner, su ciruga se puede posponer.   __x__ 1. Do not eat food or drink liquids after midnight. No gum chewing or hard candies.  No coma alimentos ni tome lquidos despus de la medianoche.  No mastique chicle ni caramelos  duros.     _x___ 2. No alcohol for 24 hours before or after surgery.    No tome alcohol durante las 24 horas antes ni despus de la [place].   ____ 3. Bring all medications with you on the day of surgery if instructed.    Lleve todos los medicamentos con usted el da de su ciruga si se le ha indicado as.   _x___ 4. Notify your doctor if there is any change in your medical condition (cold, fever,                             infections).    Informe a su mdico si hay algn cambio en su condicin mdica (resfriado, fiebre, infecciones).   Do not wear jewelry, make-up, hairpins, clips or nail polish.  No use joyas, maquillajes, pinzas/ganchos para el cabello ni esmalte de uas.  Do not wear lotions, powders, or perfumes. You may wear deodorant.  No use lociones, polvos o perfumes.  Puede usar desodorante.    Do not shave 48 hours prior to surgery. Men may shave face and neck.  No se afeite 48 horas antes de la [place].  Los hombres pueden [employer] cara y  el cuello.   Do not bring valuables to the hospital.   No lleve objetos [place] is not responsible for any belongings or valuables.  [place] no se hace responsable de ningn tipo de pertenencias u objetos de Geographical information systems officer.               Contacts, dentures or bridgework may not be worn into surgery.  Los lentes de [place], las dentaduras postizas o puentes no se pueden usar en la [place].  Leave your suitcase in the car. After surgery it may be brought to your room.  Deje su maleta en el auto.  Despus de la ciruga podr traerla a su habitacin.  For patients admitted to the hospital, discharge time is determined by your treatment team.  Para los pacientes que sean ingresados al hospital, el tiempo en el cual se le dar de alta es determinado por su                equipo de [place].   Patients discharged the day of surgery will not be allowed to drive home. A los pacientes que se les da de alta el mismo da de la ciruga no se les permitir conducir a Holiday representative.   Please read over  the following fact sheets that you were given: Por favor [place] hojas de informacin que le dieron:      __x__ Take these medicines the morning of surgery with A SIP OF WATER:          [employer] medicinas la maana de la ciruga con UN SORBO DE AGUA:  1. LISINOPRIL  2.   3.   4.       5.  6.  ____ Fleet Enema (as directed)          Enema de Fleet (segn lo indicado)    _x___ Use CHG Soap as directed (INSTRUCTION SHEET GIVEN TO PATIENT IN SPANISH)          Utilice el jabn de CHG segn lo indicado  ____ Use inhalers on the day of surgery          Use los inhaladores el da de la ciruga  ____ Stop metformin 2 days prior to surgery          Deje de tomar el metformin 2 das antes de la ciruga    ____ Take 1/2 of usual insulin dose the night before surgery and none on the morning of surgery           Tome la mitad de la dosis habitual de insulina la noche antes de  la [place] y no tome nada en la maana de la             ciruga  __x__ Stop Coumadin/Plavix/aspirin on (NO ASPIRIN)          Deje de tomar el Coumadin/Plavix/aspirina el da:  __x_ Stop Anti-inflammatories on (NO ANTI-INFLAMMATORY MEDICATIONS) TYLENOL OK TO TAKE FOR PAIN IF NEEDED          Deje de tomar antiinflamatorios el da:   __x__ Stop supplements until after surgery            Deje de tomar suplementos hasta despus de la ciruga  ____ Bring C-Pap to the hospital          [place] al hospital

## 2016-12-03 ENCOUNTER — Ambulatory Visit: Payer: BLUE CROSS/BLUE SHIELD | Admitting: Anesthesiology

## 2016-12-03 ENCOUNTER — Ambulatory Visit
Admission: RE | Admit: 2016-12-03 | Discharge: 2016-12-03 | Disposition: A | Discharge status: Home / Self Care | Payer: BLUE CROSS/BLUE SHIELD | Source: Ambulatory Visit | Attending: Orthopedic Surgery | Admitting: Orthopedic Surgery

## 2016-12-03 ENCOUNTER — Encounter: Payer: Self-pay | Admitting: *Deleted

## 2016-12-03 ENCOUNTER — Encounter
Admission: RE | Disposition: A | Discharge status: Home / Self Care | Payer: Self-pay | Source: Ambulatory Visit | Attending: Orthopedic Surgery

## 2016-12-03 DIAGNOSIS — Z87891 Personal history of nicotine dependence: Secondary | ICD-10-CM | POA: Diagnosis not present

## 2016-12-03 DIAGNOSIS — Z96651 Presence of right artificial knee joint: Secondary | ICD-10-CM | POA: Diagnosis not present

## 2016-12-03 DIAGNOSIS — X58XXXA Exposure to other specified factors, initial encounter: Secondary | ICD-10-CM | POA: Insufficient documentation

## 2016-12-03 DIAGNOSIS — Z79899 Other long term (current) drug therapy: Secondary | ICD-10-CM | POA: Diagnosis not present

## 2016-12-03 DIAGNOSIS — K219 Gastro-esophageal reflux disease without esophagitis: Secondary | ICD-10-CM | POA: Diagnosis not present

## 2016-12-03 DIAGNOSIS — F329 Major depressive disorder, single episode, unspecified: Secondary | ICD-10-CM | POA: Insufficient documentation

## 2016-12-03 DIAGNOSIS — S83232A Complex tear of medial meniscus, current injury, left knee, initial encounter: Secondary | ICD-10-CM | POA: Diagnosis not present

## 2016-12-03 DIAGNOSIS — M1712 Unilateral primary osteoarthritis, left knee: Secondary | ICD-10-CM | POA: Diagnosis not present

## 2016-12-03 DIAGNOSIS — F419 Anxiety disorder, unspecified: Secondary | ICD-10-CM | POA: Insufficient documentation

## 2016-12-03 DIAGNOSIS — I10 Essential (primary) hypertension: Secondary | ICD-10-CM | POA: Diagnosis not present

## 2016-12-03 DIAGNOSIS — M94262 Chondromalacia, left knee: Secondary | ICD-10-CM | POA: Diagnosis not present

## 2016-12-03 HISTORY — PX: KNEE ARTHROSCOPY: SHX127

## 2016-12-03 LAB — POCT PREGNANCY, URINE: Preg Test, Ur: NEGATIVE

## 2016-12-03 SURGERY — ARTHROSCOPY, KNEE
Anesthesia: General | Laterality: Left

## 2016-12-03 MED ORDER — FAMOTIDINE 20 MG PO TABS
ORAL_TABLET | ORAL | Status: AC
  Administered 2016-12-03: 20 mg via ORAL
  Filled 2016-12-03: qty 1

## 2016-12-03 MED ORDER — MIDAZOLAM HCL 2 MG/2ML IJ SOLN
INTRAMUSCULAR | Status: DC | PRN
  Administered 2016-12-03: 2 mg via INTRAVENOUS

## 2016-12-03 MED ORDER — HYDROCODONE-ACETAMINOPHEN 5-325 MG PO TABS
ORAL_TABLET | ORAL | Status: AC
  Filled 2016-12-03: qty 2

## 2016-12-03 MED ORDER — SUCCINYLCHOLINE CHLORIDE 20 MG/ML IJ SOLN
INTRAMUSCULAR | Status: DC | PRN
  Administered 2016-12-03: 80 mg via INTRAVENOUS

## 2016-12-03 MED ORDER — LIDOCAINE HCL (CARDIAC) 20 MG/ML IV SOLN
INTRAVENOUS | Status: DC | PRN
  Administered 2016-12-03: 50 mg via INTRAVENOUS

## 2016-12-03 MED ORDER — FENTANYL CITRATE (PF) 100 MCG/2ML IJ SOLN
INTRAMUSCULAR | Status: DC | PRN
  Administered 2016-12-03 (×4): 25 ug via INTRAVENOUS

## 2016-12-03 MED ORDER — CEFAZOLIN SODIUM-DEXTROSE 2-4 GM/100ML-% IV SOLN
INTRAVENOUS | Status: AC
  Filled 2016-12-03: qty 100

## 2016-12-03 MED ORDER — FENTANYL CITRATE (PF) 100 MCG/2ML IJ SOLN
INTRAMUSCULAR | Status: AC
  Administered 2016-12-03: 25 ug via INTRAVENOUS
  Filled 2016-12-03: qty 2

## 2016-12-03 MED ORDER — LACTATED RINGERS IV SOLN
INTRAVENOUS | Status: DC | PRN
  Administered 2016-12-03: 07:00:00 via INTRAVENOUS

## 2016-12-03 MED ORDER — ONDANSETRON HCL 4 MG/2ML IJ SOLN: 4.0000 mg | Freq: Once | INTRAMUSCULAR | Status: DC | PRN

## 2016-12-03 MED ORDER — FENTANYL CITRATE (PF) 100 MCG/2ML IJ SOLN
25.0000 ug | INTRAMUSCULAR | Status: DC | PRN
  Administered 2016-12-03 (×4): 25 ug via INTRAVENOUS

## 2016-12-03 MED ORDER — PROPOFOL 10 MG/ML IV BOLUS
INTRAVENOUS | Status: AC
  Filled 2016-12-03: qty 20

## 2016-12-03 MED ORDER — HYDROCODONE-ACETAMINOPHEN 5-325 MG PO TABS: 1.0000 | ORAL_TABLET | Freq: Four times a day (QID) | ORAL | 0 refills | Status: DC | PRN

## 2016-12-03 MED ORDER — HYDROCODONE-ACETAMINOPHEN 5-325 MG PO TABS
1.0000 | ORAL_TABLET | ORAL | Status: DC | PRN
  Administered 2016-12-03: 2 via ORAL

## 2016-12-03 MED ORDER — LACTATED RINGERS IR SOLN
Status: DC | PRN
  Administered 2016-12-03: 6000 mL

## 2016-12-03 MED ORDER — BUPIVACAINE-EPINEPHRINE (PF) 0.5% -1:200000 IJ SOLN
INTRAMUSCULAR | Status: DC | PRN
  Administered 2016-12-03: 20 mL via PERINEURAL

## 2016-12-03 MED ORDER — LACTATED RINGERS IV SOLN
INTRAVENOUS | Status: DC
  Administered 2016-12-03: 07:00:00 via INTRAVENOUS

## 2016-12-03 MED ORDER — PHENYLEPHRINE HCL 10 MG/ML IJ SOLN
INTRAMUSCULAR | Status: DC | PRN
  Administered 2016-12-03: 50 ug via INTRAVENOUS

## 2016-12-03 MED ORDER — KETOROLAC TROMETHAMINE 30 MG/ML IJ SOLN
INTRAMUSCULAR | Status: DC | PRN
Start: 2016-12-03 — End: 2016-12-03
  Administered 2016-12-03: 30 mg via INTRAVENOUS

## 2016-12-03 MED ORDER — BUPIVACAINE-EPINEPHRINE (PF) 0.5% -1:200000 IJ SOLN
INTRAMUSCULAR | Status: AC
  Filled 2016-12-03: qty 30

## 2016-12-03 MED ORDER — FENTANYL CITRATE (PF) 100 MCG/2ML IJ SOLN
INTRAMUSCULAR | Status: AC
  Filled 2016-12-03: qty 2

## 2016-12-03 MED ORDER — MIDAZOLAM HCL 2 MG/2ML IJ SOLN
INTRAMUSCULAR | Status: AC
  Filled 2016-12-03: qty 2

## 2016-12-03 MED ORDER — ONDANSETRON HCL 4 MG/2ML IJ SOLN
INTRAMUSCULAR | Status: DC | PRN
  Administered 2016-12-03: 4 mg via INTRAVENOUS

## 2016-12-03 MED ORDER — PROPOFOL 10 MG/ML IV BOLUS
INTRAVENOUS | Status: DC | PRN
  Administered 2016-12-03: 160 mg via INTRAVENOUS

## 2016-12-03 MED ORDER — CEFAZOLIN SODIUM-DEXTROSE 2-4 GM/100ML-% IV SOLN
2.0000 g | Freq: Once | INTRAVENOUS | Status: AC
  Administered 2016-12-03: 2 g via INTRAVENOUS

## 2016-12-03 MED ORDER — FAMOTIDINE 20 MG PO TABS
20.0000 mg | ORAL_TABLET | Freq: Once | ORAL | Status: AC
  Administered 2016-12-03: 20 mg via ORAL

## 2016-12-03 SURGICAL SUPPLY — 36 items
BANDAGE ACE 4X5 VEL STRL LF (GAUZE/BANDAGES/DRESSINGS) IMPLANT
BANDAGE ELASTIC 4 LF NS (GAUZE/BANDAGES/DRESSINGS) ×3 IMPLANT
BLADE FULL RADIUS 3.5 (BLADE) IMPLANT
BLADE INCISOR PLUS 4.5 (BLADE) IMPLANT
BLADE SHAVER 4.5 DBL SERAT CV (CUTTER) IMPLANT
BLADE SHAVER 4.5X7 STR FR (MISCELLANEOUS) IMPLANT
CHLORAPREP W/TINT 26ML (MISCELLANEOUS) ×3 IMPLANT
CUFF TOURN 24 STER (MISCELLANEOUS) ×3 IMPLANT
CUFF TOURN 30 STER DUAL PORT (MISCELLANEOUS) IMPLANT
GAUZE SPONGE 4X4 12PLY STRL (GAUZE/BANDAGES/DRESSINGS) ×3 IMPLANT
GLOVE BIO SURGEON STRL SZ 6.5 (GLOVE) ×2 IMPLANT
GLOVE BIO SURGEONS STRL SZ 6.5 (GLOVE) ×1
GLOVE BIOGEL PI IND STRL 7.0 (GLOVE) ×1 IMPLANT
GLOVE BIOGEL PI INDICATOR 7.0 (GLOVE) ×2
GLOVE SKINSENSE NS SZ6.5 (GLOVE) ×2
GLOVE SKINSENSE STRL SZ6.5 (GLOVE) ×1 IMPLANT
GLOVE SURG ORTHO 7.0 STRL STRW (GLOVE) ×3 IMPLANT
GLOVE SURG SYN 9.0  PF PI (GLOVE) ×2
GLOVE SURG SYN 9.0 PF PI (GLOVE) ×1 IMPLANT
GOWN SRG 2XL LVL 4 RGLN SLV (GOWNS) ×1 IMPLANT
GOWN STRL NON-REIN 2XL LVL4 (GOWNS) ×2
GOWN STRL REUS W/ TWL LRG LVL3 (GOWN DISPOSABLE) ×2 IMPLANT
GOWN STRL REUS W/TWL LRG LVL3 (GOWN DISPOSABLE) ×4
IV LACTATED RINGER IRRG 3000ML (IV SOLUTION) ×4
IV LR IRRIG 3000ML ARTHROMATIC (IV SOLUTION) ×2 IMPLANT
KIT RM TURNOVER STRD PROC AR (KITS) ×3 IMPLANT
MANIFOLD NEPTUNE II (INSTRUMENTS) ×3 IMPLANT
PACK ARTHROSCOPY KNEE (MISCELLANEOUS) ×3 IMPLANT
SET TUBE SUCT SHAVER OUTFL 24K (TUBING) ×3 IMPLANT
SET TUBE TIP INTRA-ARTICULAR (MISCELLANEOUS) ×3 IMPLANT
SUT ETHILON 4-0 (SUTURE) ×2
SUT ETHILON 4-0 FS2 18XMFL BLK (SUTURE) ×1
SUTURE ETHLN 4-0 FS2 18XMF BLK (SUTURE) ×1 IMPLANT
TUBING ARTHRO INFLOW-ONLY STRL (TUBING) ×3 IMPLANT
WAND COBLATION FLOW 50 (SURGICAL WAND) ×3 IMPLANT
WAND HAND CNTRL MULTIVAC 50 (MISCELLANEOUS) ×3 IMPLANT

## 2016-12-03 NOTE — Discharge Instructions (Addendum)
Keep dressing clean and dry. Aspirin 325 mg by mouth daily. Ice to knee today and tomorrow. Pain medicine as directed.   AMBULATORY SURGERY  DISCHARGE INSTRUCTIONS   1) The drugs that you were given will stay in your system until tomorrow so for the next 24 hours you should not:  A) Drive an automobile B) Make any legal decisions C) Drink any alcoholic beverage   2) You may resume regular meals tomorrow.  Today it is better to start with liquids and gradually work up to solid foods.  You may eat anything you prefer, but it is better to start with liquids, then soup and crackers, and gradually work up to solid foods.   3) Please notify your doctor immediately if you have any unusual bleeding, trouble breathing, redness and pain at the surgery site, drainage, fever, or pain not relieved by medication.    4) Additional Instructions:        Please contact your physician with any problems or Same Day Surgery at [phone], Monday through Friday 6 am to 4 pm, or [place] [facility] number at [phone].

## 2016-12-03 NOTE — Anesthesia Postprocedure Evaluation (Signed)
Anesthesia Post Note  Patient: Caitlyn [name]  Procedure(s) Performed: Procedure(s) (LRB): ARTHROSCOPY KNEE, partial medial menisectomy (Left)  Patient location during evaluation: PACU Anesthesia Type: General Level of consciousness: awake and alert and oriented Pain management: pain level controlled Vital Signs Assessment: post-procedure vital signs reviewed and stable Respiratory status: spontaneous breathing Cardiovascular status: blood pressure returned to baseline Anesthetic complications: no     Last Vitals:  Vitals:   12/03/16 0909 12/03/16 1018  BP: (!) 143/78 133/76  Pulse: 60 60  Resp: 16 16  Temp: 36.8 C     Last Pain:  Vitals:   12/03/16 1018  TempSrc:   PainSc: 9                  Jobeth Pangilinan

## 2016-12-03 NOTE — Anesthesia Post-op Follow-up Note (Cosign Needed)
Anesthesia QCDR form completed.        

## 2016-12-03 NOTE — Op Note (Signed)
12/03/2016  8:13 AM  PATIENT:  Caitlyn [name]  51 y.o. female  PRE-OPERATIVE DIAGNOSIS:  tear medial and latral meniscus , arthritis left knee   POST-OPERATIVE DIAGNOSIS:  medial meniscus tear, arthritis left knee  PROCEDURE:  Procedure(s): ARTHROSCOPY KNEE, partial medial menisectomy (Left)  SURGEON: Laurene Footman, MD  ASSISTANTS: None  ANESTHESIA:   general  EBL:  No intake/output data recorded.  BLOOD ADMINISTERED:none  DRAINS: none   LOCAL MEDICATIONS USED:  MARCAINE     SPECIMEN:  No Specimen  DISPOSITION OF SPECIMEN:  N/A  COUNTS:  YES  TOURNIQUET:   none  IMPLANTS: None  DICTATION: .Dragon Dictation patient brought the operating room and after adequate general anesthesia was obtained the left leg was prepped and draped in sterile fashion with the arthroscopic leg holder in place. After appropriate patient identification and timeout procedure, an inferior lateral portal was made and the arthroscope was introduced. 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 Mr. medial portal was made on probing there is a posterior horn tear consistent with MRI findings is also extensive degenerative changes with a few areas of exposed bone on the medial femoral condyle tibial condyle was less worn with partial-thickness loss. The ArthroCare wand was then used to smooth the cartilage edges and then take care of the meniscus tear with partial medial meniscectomy getting back to a stable margin. The anterior cruciate ligament was intact and the lateral compartment showed very minimal degenerative changes and I'm extensive probing no tear could be found in the lateral meniscus after thorough irrigation of the knee after having addressed the medial compartment issues the inch mentation was withdrawn and the wounds closed with simple 4-0 nylon followed by 20 cc half  percent Sensorcaine with epinephrine. Xeroform 4 x 4 web roll and Ace wrap applied  PLAN OF CARE: Discharge to home after PACU  PATIENT DISPOSITION:  PACU - hemodynamically stable.

## 2016-12-03 NOTE — Anesthesia Preprocedure Evaluation (Signed)
Anesthesia Evaluation  Patient identified by MRN, date of birth, ID band Patient awake    Reviewed: Allergy & Precautions, NPO status , Patient's Chart, lab work & pertinent test results  History of Anesthesia Complications Negative for: history of anesthetic complications  Airway Mallampati: II       Dental  (+) Chipped   Pulmonary neg pulmonary ROS, former smoker,    Pulmonary exam normal        Cardiovascular hypertension, Pt. on medications Normal cardiovascular exam     Neuro/Psych Anxiety Depression negative neurological ROS     GI/Hepatic Neg liver ROS, GERD  Medicated,  Endo/Other  negative endocrine ROS  Renal/GU negative Renal ROS     Musculoskeletal  (+) Arthritis , Osteoarthritis,    Abdominal Normal abdominal exam  (+)   Peds  Hematology negative hematology ROS (+) anemia , JEHOVAH'S WITNESS  Anesthesia Other Findings   Reproductive/Obstetrics                             Anesthesia Physical  Anesthesia Plan  ASA: II  Anesthesia Plan: General   Post-op Pain Management:    Induction: Intravenous and Rapid sequence  Airway Management Planned: Oral ETT  Additional Equipment:   Intra-op Plan:   Post-operative Plan: Extubation in OR  Informed Consent: I have reviewed the patients History and Physical, chart, labs and discussed the procedure including the risks, benefits and alternatives for the proposed anesthesia with the patient or authorized representative who has indicated his/her understanding and acceptance.     Plan Discussed with:   Anesthesia Plan Comments:         Anesthesia Quick Evaluation

## 2016-12-03 NOTE — Anesthesia Procedure Notes (Signed)
Procedure Name: Intubation Date/Time: 12/03/2016 7:31 AM Performed by: [id number], Zyair Rhein Pre-anesthesia Checklist: Emergency Drugs available, Suction available, Timeout performed, Patient being monitored and Patient identified Patient Re-evaluated:Patient Re-evaluated prior to inductionOxygen Delivery Method: Circle system utilized Preoxygenation: Pre-oxygenation with 100% oxygen Intubation Type: IV induction and Rapid sequence Grade View: Grade I Tube type: Oral Tube size: 7.0 mm Number of attempts: 1 Airway Equipment and Method: Stylet Placement Confirmation: ETT inserted through vocal cords under direct vision,  positive ETCO2,  CO2 detector and breath sounds checked- equal and bilateral Secured at: 22 cm Tube secured with: Tape

## 2016-12-03 NOTE — Transfer of Care (Signed)
Immediate Anesthesia Transfer of Care Note  Patient: Caitlyn [name]  Procedure(s) Performed: Procedure(s): ARTHROSCOPY KNEE, partial medial menisectomy (Left)  Patient Location: PACU  Anesthesia Type:General  Level of Consciousness: awake  Airway & Oxygen Therapy: Patient Spontanous Breathing and Patient connected to face mask oxygen  Post-op Assessment: Report given to RN  Post vital signs: Reviewed and stable  Last Vitals:  Vitals:   12/03/16 0610  BP: (!) 169/78  Pulse: 79  Resp: 18  Temp: 36.9 C    Last Pain:  Vitals:   12/03/16 0610  TempSrc: Oral         Complications: No apparent anesthesia complications

## 2016-12-03 NOTE — H&P (Signed)
Reviewed paper H+P, will be scanned into chart. Patient examined No changes noted.  

## 2016-12-22 ENCOUNTER — Encounter: Payer: Self-pay | Admitting: Obstetrics and Gynecology

## 2016-12-22 ENCOUNTER — Ambulatory Visit (INDEPENDENT_AMBULATORY_CARE_PROVIDER_SITE_OTHER): Payer: BLUE CROSS/BLUE SHIELD | Admitting: Obstetrics and Gynecology

## 2016-12-22 VITALS — BP 153/75 | HR 71 | Ht 61.0 in | Wt 180.1 lb

## 2016-12-22 DIAGNOSIS — D259 Leiomyoma of uterus, unspecified: Secondary | ICD-10-CM

## 2016-12-22 DIAGNOSIS — N920 Excessive and frequent menstruation with regular cycle: Secondary | ICD-10-CM | POA: Diagnosis not present

## 2016-12-22 MED ORDER — NORETHINDRONE ACETATE 5 MG PO TABS: 5.0000 mg | ORAL_TABLET | Freq: Every day | ORAL | 2 refills | Status: DC

## 2016-12-22 MED ORDER — NORETHINDRONE ACETATE 5 MG PO TABS: 15.0000 mg | ORAL_TABLET | Freq: Every day | ORAL | 0 refills | Status: DC

## 2016-12-22 NOTE — Patient Instructions (Addendum)
1. Hysterectomy is scheduled today-TAH bilateral salpingectomy left oophorectomy 2. CBC is obtained today 3. Recommend multivitamin with iron supplementation daily 4. Begin taking norethindrone acetate 15 mg a day for the next 30 days to stop bleeding 5. Return for preoperative appointment the week before surgery 6. Endometrial biopsy is performed today 7. Results will be made available to patient  Biopsia de endometrio - Cuidados posteriores (Endometrial Biopsy, Care After) Siga estas instrucciones durante las prximas semanas. Estas indicaciones le proporcionan informacin general acerca de cmo deber cuidarse despus del procedimiento. El mdico tambin podr darle instrucciones ms especficas. El tratamiento se ha planificado de acuerdo a las prcticas mdicas actuales, pero a veces se producen problemas. Comunquese con el mdico si tiene algn problema o tiene dudas despus del procedimiento. QU ESPERAR DESPUS DEL PROCEDIMIENTO Despus del procedimiento, es tpico tener las siguientes sensaciones:  Sentir clicos leves y tendr una pequea cantidad de sangrado vaginal durante algunos das despus del procedimiento. Esto es normal. INSTRUCCIONES PARA EL CUIDADO EN EL HOGAR  Tome slo medicamentos de venta libre o recetados, segn las indicaciones del mdico.  No utilice tampones, duchas vaginales ni tenga relaciones sexuales hasta que el profesional la autorice.  Siga las indicaciones del mdico relacionadas con la restriccin a ciertas actividades, como ejercicios fsicos intensos o levantar objetos pesados. SOLICITE ATENCIN MDICA SI:  Tiene un sangrado abundante o sangra durante ms de 2 das despus del procedimiento.  Advierte un olor ftido que proviene de la vagina.  Siente escalofros o tiene fiebre.  Siente un dolor en el bajo vientre (abdominal) muy intenso. SOLICITE ATENCIN MDICA DE INMEDIATO SI:  Siente clicos intensos en el estmago o en la  espalda.  Elimina cogulos grandes.  La hemorragia aumenta.  Se siente mareada, dbil, o se desmaya. Esta informacin no tiene Marine scientist el consejo del mdico. Asegrese de hacerle al mdico cualquier pregunta que tenga. Document Released: 06/07/2013 Document Revised: 06/07/2013 Document Reviewed: 02/01/2013 Elsevier Interactive Patient Education  2017 [place] abdominal (Abdominal Hysterectomy) La histerectoma abdominal es un procedimiento quirrgico en el que se extirpa el tero. El tero es el rgano muscular donde se desarrolla el feto. Esta ciruga puede hacerse por muchos motivos. Puede necesitar una histerectoma abdominal si tiene cncer, tumores, dolor a largo plazo o hemorragia. Tambin pueden hacerle este procedimiento si el tero ha descendido hacia la vagina (prolapso uterino). Segn las causas por las que necesite una histerectoma abdominal, es posible que tambin le extirpen otros rganos del aparato reproductor. Estos podran incluir la parte de la vagina que se conecta con el tero (cuello del tero), los rganos que producen vulos (ovarios) y las trompas que [employer] ovarios con el tero (trompas de [place]). INFORME AL MDICO:  Cualquier alergia que tenga.  Todos los [employer], incluidos vitaminas, hierbas, gotas oftlmicas, cremas y medicamentos de venta libre.  Problemas previos que usted o los [employer] de su familia hayan tenido con el uso de anestsicos.  Enfermedades de la sangre que tenga.  Cirugas previas.  Enfermedades que tenga. RIESGOS Y COMPLICACIONES En general, se trata de un procedimiento seguro. Sin embargo, Engineer, technical sales, pueden surgir problemas. La infeccin es el problema ms frecuente despus de una histerectoma abdominal. Otros problemas posibles incluyen:  Hemorragias.  Formacin de cogulos sanguneos que pueden desprenderse y Sports administrator a los pulmones.  Lesin en otros  rganos cercanos al tero.  Lesin en los nervios que causa neuralgia.  Menor inters sexual o Mining engineer  las [employer]. ANTES DEL [place] abdominal es un procedimiento quirrgico mayor. Puede afectar la percepcin que tiene de usted [place]. Hable con el mdico [employer] fsicos y emocionales que puede causar la histerectoma.  Quiz deban hacerle anlisis de [place] y radiografas antes de la [place].  Si fuma, deje de hacerlo. Pdale ayuda al mdico si est teniendo inconvenientes para dejar de fumar.  Deje de tomar medicamentos anticoagulantes segn las indicaciones del mdico.  Pueden indicarle que tome antibiticos o laxantes antes de la [place].  No coma ni beba nada durante las 6 a 8 horas previas a la [place].  Tome sus medicamentos habituales con un sorbito de [place].  Tome un bao de inmersin o una ducha la noche o la maana anterior al procedimiento. PROCEDIMIENTO  La histerectoma abdominal se hace en el quirfano del hospital.  En la mayora de los [place], se le administrar un medicamento que la har dormir (anestesia general).  El cirujano har un corte (incisin) a travs de la piel en la parte inferior del abdomen.  La incisin puede tener de 5a 7pulgadas de [place]. Puede ser horizontal o vertical.  El cirujano apartar el tejido que recubre al tero. Luego, extraer con cuidado el tero junto con cualquier otro rgano del aparato reproductor que Media planner.  La hemorragia se controlar con pinzas o suturas.  El cirujano cerrar la incisin con suturas o clips metlicos. DESPUS DEL PROCEDIMIENTO  Sentir algo de dolor inmediatamente despus del procedimiento.  Le administrarn medicamentos para calmar el dolor cuando est en el rea de recuperacin.  La llevarn a la habitacin del hospital cuando se haya recuperado de la anestesia.  Es posible que Arboriculturist hospital durante 2a  5das.  Recibir instrucciones para recuperarse en su casa. Esta informacin no tiene Marine scientist el consejo del mdico. Asegrese de hacerle al mdico cualquier pregunta que tenga. Document Released: 08/22/2013 Document Revised: 08/22/2013 Document Reviewed: 06/09/2013 Elsevier Interactive Patient Education  2017 [employer].

## 2016-12-22 NOTE — Progress Notes (Signed)
Chief complaint: 1. Multi-fibroid uterus 2. Menorrhagia  51 year old [place] female with known history of fibroid uterus and menorrhagia to anemia, previously recommended to undergo hysterectomy, presents for follow-up one year after her initial diagnosis. Patient states that she made attempts at: To schedule her surgery, but did not return a follow-up call. Over the past 12 months she has continued to have heavy irregular periods upwards of 10-11 days per month. She is interested in being evaluated for consideration of hysterectomy. Evaluation last year was notable for the patient having a 14-16 weeks size fibroid uterus; endometrial biopsy last year was benign.  Past Medical History:  Diagnosis Date  . Anemia   . Anxiety   . Arthritis   . Depression   . GERD (gastroesophageal reflux disease)   . Heart burn   . Hypertension     Past Surgical History:  Procedure Laterality Date  . CHOLECYSTECTOMY    . CHOLECYSTECTOMY, LAPAROSCOPIC    . JOINT REPLACEMENT    . KNEE ARTHROSCOPY Left 12/03/2016   Procedure: ARTHROSCOPY KNEE, partial medial menisectomy;  Surgeon: Hessie Knows, MD;  Location: [facility] ORS;  Service: Orthopedics;  Laterality: Left;  . LAPAROSCOPIC OVARIAN CYSTECTOMY    . MENISCECTOMY Right   . TOTAL KNEE ARTHROPLASTY Right 07/07/2016   Procedure: TOTAL KNEE ARTHROPLASTY;  Surgeon: Hessie Knows, MD;  Location: [facility] ORS;  Service: Orthopedics;  Laterality: Right;  . TUBAL LIGATION     Review of Systems  Constitutional: Positive for malaise/fatigue. Negative for chills, fever and weight loss.  Respiratory: Negative.   Cardiovascular: Negative.   Gastrointestinal: Negative.   Genitourinary:       Patient continues with heavy prolonged menstrual cycles  Musculoskeletal: Negative.   Skin: Negative.   Neurological: Negative.   Endo/Heme/Allergies: Negative.   Psychiatric/Behavioral: Negative.    OBJECTIVE: BP (!) 153/75   Pulse 71   Ht 5\' 1"  (1.549 m)   Wt 180 lb 1.6 oz  (81.7 kg)   LMP 12/12/2016 (Exact Date)   BMI 34.03 kg/m  Pleasant female in no acute distress. Alert and oriented. Abdomen: Soft, nontender; uterus palpable at approximately U -2 with left sided prominence Pelvic: Bimanual-16-18 weeks size fibroid uterus with left sided prominence External genitalia-normal BUS-normal Vagina-normal Cervix-nabothian cyst at 6:00; parous; no cervical motion tenderness Uterus-18 week size as noted Adnexa-nonpalpable nontender Active vaginal-normal external exam  Endometrial Biopsy Procedure Note  Pre-operative Diagnosis: Menorrhagia  Post-operative Diagnosis: Menorrhagia  Procedure Details   Urine pregnancy test was not done.  The risks (including infection, bleeding, pain, and uterine perforation) and benefits of the procedure were explained to the patient and Verbal informed consent was obtained.  Antibiotic prophylaxis against endocarditis was not indicated.   The patient was placed in the dorsal lithotomy position.  Bimanual exam showed the uterus to be in the neutral position.  A Graves' speculum inserted in the vagina, and the cervix prepped with povidone iodine.  Endocervical curettage with a Kevorkian curette was not performed.   A sharp tenaculum was applied to the anterior lip of the cervix for stabilization.  A sterile uterine sound was used to sound the uterus to a depth of 12 cm.  A Mylex 34mm curette was used to sample the endometrium.  Sample was sent for pathologic examination.  Condition: Stable  Complications: None  Plan:  The patient was advised to call for any fever or for prolonged or severe pain or bleeding. She was advised to use NSAID and OTC acetaminophen as needed for mild to  moderate pain. She was advised to avoid vaginal intercourse for 48 hours or until the bleeding has completely stopped.  Attending Physician Documentation: Brayton Mars, MD   ASSESSMENT: 1. Menorrhagia to anemia 2. 18 week size fibroid  uterus  PLAN: 1. Endometrial biopsy is performed 2. CBC 3. Norethindrone acetate 15 mg daily of the next 30 days 4. TAH bilateral salpingectomy left oophorectomy is scheduled 5. Return in 1 week prior surgery for preoperative appointment  A total of 25 minutes were spent face-to-face with the patient during this encounter and over half of that time involved counseling and coordination of care.  Brayton Mars, MD  Note: This dictation was prepared with Dragon dictation along with smaller phrase technology. Any transcriptional errors that result from this process are unintentional.

## 2016-12-26 LAB — PATHOLOGY

## 2017-01-12 ENCOUNTER — Encounter: Payer: Self-pay | Admitting: Obstetrics and Gynecology

## 2017-01-12 ENCOUNTER — Ambulatory Visit (INDEPENDENT_AMBULATORY_CARE_PROVIDER_SITE_OTHER): Payer: BLUE CROSS/BLUE SHIELD | Admitting: Obstetrics and Gynecology

## 2017-01-12 VITALS — BP 137/79 | HR 77 | Ht 61.0 in | Wt 181.1 lb

## 2017-01-12 DIAGNOSIS — Z01818 Encounter for other preprocedural examination: Secondary | ICD-10-CM

## 2017-01-12 DIAGNOSIS — D259 Leiomyoma of uterus, unspecified: Secondary | ICD-10-CM

## 2017-01-12 DIAGNOSIS — N920 Excessive and frequent menstruation with regular cycle: Secondary | ICD-10-CM

## 2017-01-12 NOTE — H&P (Signed)
Subjective:  PREOPERATIVE HISTORY AND PHYSICAL   Date of surgery: 01/18/2017 Procedure: Total abdominal hysterectomy bilateral salpingectomy and left oophorectomy Diagnosis: Symptomatic fibroid uterus-18 week size   Patient is a 51 y.o. [id number] scheduled for TAH bilateral salpingectomy and left oophorectomy on 01/18/2017. Patient has long history of menometrorrhagia with bleeding upwards of 10-11 days per month. Endometrial biopsy preoperatively is benign. 12/22/2016 Diagnosis:  ENDOMETRIUM, BIOPSY:  WEAKLY PROLIFERATIVE ENDOMETRIUM. NO HYPERPLASIA OR CARCINOMA.  COMMENT: Based on the initial morphologic findings  immunohistochemical stain is evaluated. Immunohistochemical stain  for CD-138 does not show any plasma cells in the stroma and rules  out chronic endometritis.  OOD/12/25/2016   Patient is currently taking norethindrone acetate 15 mg a day to suppress bleeding. CBC Latest Ref Rng & Units 11/26/2016 07/09/2016 07/08/2016  WBC 3.6 - 11.0 K/uL 12.2(H) 16.0(H) 18.6(H)  Hemoglobin 12.0 - 16.0 g/dL 10.9(L) 10.2(L) 10.7(L)  Hematocrit 35.0 - 47.0 % 34.0(L) 30.5(L) 32.5(L)  Platelets 150 - 440 K/uL 372 205 235    Pertinent Gynecological History: Past GYN history: Menarche-Age 46 Cycles- monthly Duration of flow- 5 days(2H, 3L) Dysmenorrhea-mild; central and left lower quadrant cramping with minimal low back pain; occasionally takes Advil for cramps Deep thrusting dyspareunia is noted No history of abnormal Pap smears. No history of STI's. Menorrhagia, worsening with new diagnosis of anemia Ultrasound 06/19/2011-uterus measures 7.0 x 5.9 cm; endometrial stripe 1.9 cm; right and left ovaries normal; free fluid in cul-de-sac  Ultrasound (10/23/2015) Location: [facility] Date of Service: 10/23/15   Indications: Menorrhagia  Findings:  The uterus is enlarged. It measures 13.1 x 9.2 x 9.1 cm. Echo texture is heterogenous with evidence of  afocal mass. Within  the uterus is a suspected fibroid measuring: Fibroid 1: Anterior fundus, subserosal, 8.3 x 6.0 x 6.4 cm with internal vascular flow seen.  The Endometrium is thickened with a small amount of free fluid seen within ( pt has just had an endometrial biopsy before ultrasound). It measures 17.1 mm.  Right ovary is surgically absent. Left Ovary measures 4.8 x 2.8 x 3.4 cm. It appears WNL. Survey of the adnexa demonstrates no adnexal masses. There is no free fluid in the cul de sac.  Impression: 1. Enlarged fibroid uterus. 2. S/P endometrial biopsy today.   Recommendations: 1.Clinical correlation with the patient's History and Physical Exam.   Elliott,Teresa, Rad Tech  Brayton Mars, MD  Discussed Blood/Blood Products: yes   OB History    Gravida Para Term Preterm AB Living   4 3 3   1 3    SAB TAB Ectopic Multiple Live Births   1       3      Patient's last menstrual period was 01/07/2017 (exact date).    Past Medical History:  Diagnosis Date  . Anemia   . Anxiety   . Arthritis   . Depression   . GERD (gastroesophageal reflux disease)   . Heart burn   . Hypertension     Past Surgical History:  Procedure Laterality Date  . CHOLECYSTECTOMY    . CHOLECYSTECTOMY, LAPAROSCOPIC    . JOINT REPLACEMENT    . KNEE ARTHROSCOPY Left 12/03/2016   Procedure: ARTHROSCOPY KNEE, partial medial menisectomy;  Surgeon: Hessie Knows, MD;  Location: [facility]S;  Service: Orthopedics;  Laterality: Left;  . LAPAROSCOPIC OVARIAN CYSTECTOMY    . MENISCECTOMY Right   . TOTAL KNEE ARTHROPLASTY Right 07/07/2016   Procedure: TOTAL KNEE ARTHROPLASTY;  Surgeon: Hessie Knows, MD;  Location: [facility];  Service: Orthopedics;  Laterality: Right;  . TUBAL LIGATION      OB History  Gravida Para Term Preterm AB Living  4 3 3   1 3   SAB TAB Ectopic Multiple Live Births  1       3    # Outcome Date GA Lbr Len/2nd Weight Sex Delivery Anes PTL Lv  4 Term 02/02/91    [address]   LIV  3 SAB  1992        FD  2 Term 04/16/89    Jerilynn Mages Vag-Spont   LIV  1 Term 06/24/84    Jerilynn Mages Vag-Spont   LIV      Social History   Social History  . Marital status: Single    Spouse name: N/A  . Number of children: N/A  . Years of education: N/A   Social History Main Topics  . Smoking status: Former Smoker    Packs/day: 0.50    Types: Cigarettes    Quit date: 03/01/2015  . Smokeless tobacco: Never Used  . Alcohol use 0.0 oz/week     Comment: rarely  . Drug use: No  . Sexual activity: Yes    Partners: Male    Birth control/ protection: Surgical   Other Topics Concern  . None   Social History Narrative  . None    Family History  Problem Relation Age of Onset  . Osteoarthritis Mother   . Heart failure Father   . Diabetes Father   . Hypertension Father   . Osteoarthritis Sister      (Not in a hospital admission)  No Known Allergies  Review of Systems Constitutional: No recent fever/chills/sweats Respiratory: No recent cough/bronchitis Cardiovascular: No chest pain Gastrointestinal: No recent nausea/vomiting/diarrhea Genitourinary: No UTI symptoms Hematologic/lymphatic:No history of coagulopathy or recent blood thinner use    Objective:    BP 137/79   Pulse 77   Ht 5\' 1"  (1.549 m)   Wt 181 lb 1.6 oz (82.1 kg)   LMP 01/07/2017 (Exact Date)   BMI 34.22 kg/m   General:   Normal  Skin:   normal  HEENT:  Normal  Neck:  Supple without Adenopathy or Thyromegaly  Lungs:   Heart:              Breasts:   Abdomen:  Pelvis:  M/S   Extremeties:  Neuro:    clear to auscultation bilaterally   Normal without murmur   Not Examined   soft, non-tender; bowel sounds normal; no masses,  no organomegaly   Exam deferred to OR  No CVAT  Warm/Dry   Normal          Assessment:    1. 18 week size fibroid uterus 2. Menorrhagia to anemia   Plan:  TAH bilateral salpingectomy and left oophorectomy    preop counseling: The patient is to undergo hysterectomy bilateral  salpingectomy and left oophorectomy for symptomatic fibroid uterus-18 week size on 01/18/2017. She is understanding of the planned procedure and is aware of and is accepting of all surgical risks which include but are not limited to bleeding, infection, pelvic organ injury with need for repair, blood clot disorders, anesthesia risks, etc. All questions have been answered. Informed consent is given. Patient is ready and willing to proceed with surgery as scheduled.  Brayton Mars, MD  Note: This dictation was prepared with Dragon dictation along with smaller phrase technology. Any transcriptional errors that result from this process are unintentional.

## 2017-01-12 NOTE — Patient Instructions (Signed)
1. Return in 1 week after surgery for postop check 

## 2017-01-12 NOTE — Progress Notes (Signed)
Subjective:  PREOPERATIVE HISTORY AND PHYSICAL   Date of surgery: 01/18/2017 Procedure: Total abdominal hysterectomy bilateral salpingectomy and left oophorectomy Diagnosis: Symptomatic fibroid uterus-18 week size   Patient is a 51 y.o. [id number] scheduled for TAH bilateral salpingectomy and left oophorectomy on 01/18/2017. Patient has long history of menometrorrhagia with bleeding upwards of 10-11 days per month. Endometrial biopsy preoperatively is benign. 12/22/2016 Diagnosis:  ENDOMETRIUM, BIOPSY:  WEAKLY PROLIFERATIVE ENDOMETRIUM. NO HYPERPLASIA OR CARCINOMA.  COMMENT: Based on the initial morphologic findings  immunohistochemical stain is evaluated. Immunohistochemical stain  for CD-138 does not show any plasma cells in the stroma and rules  out chronic endometritis.  OOD/12/25/2016   Patient is currently taking norethindrone acetate 15 mg a day to suppress bleeding. CBC Latest Ref Rng & Units 11/26/2016 07/09/2016 07/08/2016  WBC 3.6 - 11.0 K/uL 12.2(H) 16.0(H) 18.6(H)  Hemoglobin 12.0 - 16.0 g/dL 10.9(L) 10.2(L) 10.7(L)  Hematocrit 35.0 - 47.0 % 34.0(L) 30.5(L) 32.5(L)  Platelets 150 - 440 K/uL 372 205 235    Pertinent Gynecological History: Past GYN history: Menarche-Age 28 Cycles- monthly Duration of flow- 5 days(2H, 3L) Dysmenorrhea-mild; central and left lower quadrant cramping with minimal low back pain; occasionally takes Advil for cramps Deep thrusting dyspareunia is noted No history of abnormal Pap smears. No history of STI's. Menorrhagia, worsening with new diagnosis of anemia Ultrasound 06/19/2011-uterus measures 7.0 x 5.9 cm; endometrial stripe 1.9 cm; right and left ovaries normal; free fluid in cul-de-sac  Ultrasound (10/23/2015) Location: [facility] Date of Service: 10/23/15   Indications: Menorrhagia  Findings:  The uterus is enlarged. It measures 13.1 x 9.2 x 9.1 cm. Echo texture is heterogenous with evidence of  afocal mass. Within  the uterus is a suspected fibroid measuring: Fibroid 1: Anterior fundus, subserosal, 8.3 x 6.0 x 6.4 cm with internal vascular flow seen.  The Endometrium is thickened with a small amount of free fluid seen within ( pt has just had an endometrial biopsy before ultrasound). It measures 17.1 mm.  Right ovary is surgically absent. Left Ovary measures 4.8 x 2.8 x 3.4 cm. It appears WNL. Survey of the adnexa demonstrates no adnexal masses. There is no free fluid in the cul de sac.  Impression: 1. Enlarged fibroid uterus. 2. S/P endometrial biopsy today.   Recommendations: 1.Clinical correlation with the patient's History and Physical Exam.   Elliott,Teresa, Rad Tech  Brayton Mars, MD  Discussed Blood/Blood Products: yes   OB History    Gravida Para Term Preterm AB Living   4 3 3   1 3    SAB TAB Ectopic Multiple Live Births   1       3     Patient's last menstrual period was 01/07/2017 (exact date).    Past Medical History:  Diagnosis Date  . Anemia   . Anxiety   . Arthritis   . Depression   . GERD (gastroesophageal reflux disease)   . Heart burn   . Hypertension     Past Surgical History:  Procedure Laterality Date  . CHOLECYSTECTOMY    . CHOLECYSTECTOMY, LAPAROSCOPIC    . JOINT REPLACEMENT    . KNEE ARTHROSCOPY Left 12/03/2016   Procedure: ARTHROSCOPY KNEE, partial medial menisectomy;  Surgeon: Hessie Knows, MD;  Location: [facility] ORS;  Service: Orthopedics;  Laterality: Left;  . LAPAROSCOPIC OVARIAN CYSTECTOMY    . MENISCECTOMY Right   . TOTAL KNEE ARTHROPLASTY Right 07/07/2016   Procedure: TOTAL KNEE ARTHROPLASTY;  Surgeon: Hessie Knows, MD;  Location: [facility];  Service: Orthopedics;  Laterality: Right;  . TUBAL LIGATION      OB History  Gravida Para Term Preterm AB Living  4 3 3   1 3   SAB TAB Ectopic Multiple Live Births  1       3    # Outcome Date GA Lbr Len/2nd Weight Sex Delivery Anes PTL Lv  4 Term 02/02/91    [address]   LIV  3 SAB  1992        FD  2 Term 04/16/89    Jerilynn Mages Vag-Spont   LIV  1 Term 06/24/84    Jerilynn Mages Vag-Spont   LIV      Social History   Social History  . Marital status: Single    Spouse name: N/A  . Number of children: N/A  . Years of education: N/A   Social History Main Topics  . Smoking status: Former Smoker    Packs/day: 0.50    Types: Cigarettes    Quit date: 03/01/2015  . Smokeless tobacco: Never Used  . Alcohol use 0.0 oz/week     Comment: rarely  . Drug use: No  . Sexual activity: Yes    Partners: Male    Birth control/ protection: Surgical   Other Topics Concern  . None   Social History Narrative  . None    Family History  Problem Relation Age of Onset  . Osteoarthritis Mother   . Heart failure Father   . Diabetes Father   . Hypertension Father   . Osteoarthritis Sister      (Not in a hospital admission)  No Known Allergies  Review of Systems Constitutional: No recent fever/chills/sweats Respiratory: No recent cough/bronchitis Cardiovascular: No chest pain Gastrointestinal: No recent nausea/vomiting/diarrhea Genitourinary: No UTI symptoms Hematologic/lymphatic:No history of coagulopathy or recent blood thinner use    Objective:    BP 137/79   Pulse 77   Ht 5\' 1"  (1.549 m)   Wt 181 lb 1.6 oz (82.1 kg)   LMP 01/07/2017 (Exact Date)   BMI 34.22 kg/m   General:   Normal  Skin:   normal  HEENT:  Normal  Neck:  Supple without Adenopathy or Thyromegaly  Lungs:   Heart:              Breasts:   Abdomen:  Pelvis:  M/S   Extremeties:  Neuro:    clear to auscultation bilaterally   Normal without murmur   Not Examined   soft, non-tender; bowel sounds normal; no masses,  no organomegaly   Exam deferred to OR  No CVAT  Warm/Dry   Normal          Assessment:    1. 18 week size fibroid uterus 2. Menorrhagia to anemia   Plan:  TAH bilateral salpingectomy and left oophorectomy    preop counseling: The patient is to undergo hysterectomy bilateral  salpingectomy and left oophorectomy for symptomatic fibroid uterus-18 week size on 01/18/2017. She is understanding of the planned procedure and is aware of and is accepting of all surgical risks which include but are not limited to bleeding, infection, pelvic organ injury with need for repair, blood clot disorders, anesthesia risks, etc. All questions have been answered. Informed consent is given. Patient is ready and willing to proceed with surgery as scheduled.  Brayton Mars, MD  Note: This dictation was prepared with Dragon dictation along with smaller phrase technology. Any transcriptional errors that result from this process are unintentional.

## 2017-01-13 ENCOUNTER — Encounter
Admission: RE | Admit: 2017-01-13 | Discharge: 2017-01-13 | Disposition: A | Discharge status: Home / Self Care | Payer: BLUE CROSS/BLUE SHIELD | Source: Ambulatory Visit | Attending: Obstetrics and Gynecology | Admitting: Obstetrics and Gynecology

## 2017-01-13 DIAGNOSIS — Z01812 Encounter for preprocedural laboratory examination: Secondary | ICD-10-CM | POA: Insufficient documentation

## 2017-01-13 LAB — CBC WITH DIFFERENTIAL/PLATELET
Basophils Absolute: 0 10*3/uL (ref 0–0.1)
Basophils Relative: 0 %
EOS ABS: 0 10*3/uL (ref 0–0.7)
EOS PCT: 0 %
HCT: 31.2 % — ABNORMAL LOW (ref 35.0–47.0)
Hemoglobin: 9.9 g/dL — ABNORMAL LOW (ref 12.0–16.0)
LYMPHS ABS: 0.7 10*3/uL — AB (ref 1.0–3.6)
Lymphocytes Relative: 8 %
MCH: 21.9 pg — AB (ref 26.0–34.0)
MCHC: 31.5 g/dL — AB (ref 32.0–36.0)
MCV: 69.5 fL — ABNORMAL LOW (ref 80.0–100.0)
MONOS PCT: 2 %
Monocytes Absolute: 0.2 10*3/uL (ref 0.2–0.9)
Neutro Abs: 8.6 10*3/uL — ABNORMAL HIGH (ref 1.4–6.5)
Neutrophils Relative %: 90 %
PLATELETS: 300 10*3/uL (ref 150–440)
RBC: 4.5 MIL/uL (ref 3.80–5.20)
RDW: 15.7 % — ABNORMAL HIGH (ref 11.5–14.5)
WBC: 9.6 10*3/uL (ref 3.6–11.0)

## 2017-01-13 LAB — TYPE AND SCREEN
ABO/RH(D): O POS
ANTIBODY SCREEN: NEGATIVE

## 2017-01-13 LAB — RAPID HIV SCREEN (HIV 1/2 AB+AG)
HIV 1/2 Antibodies: NONREACTIVE
HIV-1 P24 ANTIGEN - HIV24: NONREACTIVE

## 2017-01-13 NOTE — Patient Instructions (Addendum)
Your procedure is scheduled on: 01/18/17 Mon Su procedimiento est programado para: Report to Day Surgery. Presntese a: To find out your arrival time please call [phone] between 1PM - 3PM on  Para saber su hora de llegada por favor llame al ([facility]: 01/15/17 Fri  Remember: Instructions that are not followed completely may result in serious medical risk, up to and including death, or upon the discretion of your surgeon and anesthesiologist your surgery may need to be rescheduled.  Recuerde: Las instrucciones que no se siguen completamente Heritage manager en un riesgo de salud grave, incluyendo hasta la [place] o a discrecin de su cirujano y Environmental health practitioner, su ciruga se puede posponer.   _x___ 1. Do not eat food or drink liquids after midnight. No gum chewing or hard candies.  No coma alimentos ni tome lquidos despus de la medianoche.  No mastique chicle ni caramelos  duros.     _x___ 2. No alcohol for 24 hours before or after surgery.    No tome alcohol durante las 24 horas antes ni despus de la [place].   ____ 3. Bring all medications with you on the day of surgery if instructed.    Lleve todos los medicamentos con usted el da de su ciruga si se le ha indicado as.   _x___ 4. Notify your doctor if there is any change in your medical condition (cold, fever,                             infections).    Informe a su mdico si hay algn cambio en su condicin mdica (resfriado, fiebre, infecciones).   Do not wear jewelry, make-up, hairpins, clips or nail polish.  No use joyas, maquillajes, pinzas/ganchos para el cabello ni esmalte de uas.  Do not wear lotions, powders, or perfumes. You may wear deodorant.  No use lociones, polvos o perfumes.  Puede usar desodorante.    Do not shave 48 hours prior to surgery. Men may shave face and neck.  No se afeite 48 horas antes de la [place].  Los hombres pueden [employer] cara y el cuello.   Do not bring  valuables to the hospital.   No lleve objetos [facility] is not responsible for any belongings or valuables.  [facility] no se hace responsable de ningn tipo de pertenencias u objetos de Geographical information systems officer.               Contacts, dentures or bridgework may not be worn into surgery.  Los lentes de [place], las dentaduras postizas o puentes no se pueden usar en la [place].  Leave your suitcase in the car. After surgery it may be brought to your room.  Deje su maleta en el auto.  Despus de la ciruga podr traerla a su habitacin.  For patients admitted to the hospital, discharge time is determined by your treatment team.  Para los pacientes que sean ingresados al hospital, el tiempo en el cual se le dar de alta es determinado por su                equipo de [place].   Patients discharged the day of surgery will not be allowed to drive home. A los pacientes que se les da de alta el mismo da de la ciruga no se les permitir conducir a Holiday representative.   Please read over the following fact sheets  that you were given: Por favor lea las siguientes hojas de informacin que le dieron:      ____ Take these medicines the morning of surgery with A SIP OF WATER:          [employer] estas medicinas la maana de la ciruga con UN SORBO DE AGUA:  1.lisinopril (PRINIVIL,ZESTRIL  2. omeprazole (PRILOSEC  3. traMADol (ULTRAM) 50 MG   4.       5.  6.  ____ Fleet Enema (as directed)          Enema de Fleet (segn lo indicado)    ____ Use CHG Soap as directed          Utilice el jabn de CHG segn lo indicado  ____ Use inhalers on the day of surgery          Use los inhaladores el da de la ciruga  ____ Stop metformin 2 days prior to surgery          Deje de tomar el metformin 2 das antes de la ciruga    ____ Take 1/2 of usual insulin dose the night before surgery and none on the morning of surgery           Tome la mitad de la dosis habitual de insulina la noche antes de la [place] y no  tome nada en la maana de la             ciruga  ____ Stop Coumadin/Plavix/aspirin on          Deje de tomar el Coumadin/Plavix/aspirina el da:  __x      Natasha Mead de tomar antiinflamatorios el da:No Aspirin or Motrin etc til after surgery OK to take Tylenol   ____ Stop supplements until after surgery            Deje de tomar suplementos hasta despus de la ciruga  ____ Bring C-Pap to the hospital          [place] al hospital

## 2017-01-13 NOTE — Pre-Procedure Instructions (Signed)
Interpreter in room during pre op interview.

## 2017-01-14 LAB — RPR: RPR Ser Ql: NONREACTIVE

## 2017-01-17 MED ORDER — CEFAZOLIN SODIUM-DEXTROSE 2-4 GM/100ML-% IV SOLN
2.0000 g | INTRAVENOUS | Status: AC
  Administered 2017-01-18: 2 g via INTRAVENOUS

## 2017-01-18 ENCOUNTER — Inpatient Hospital Stay: Payer: BLUE CROSS/BLUE SHIELD | Admitting: Anesthesiology

## 2017-01-18 ENCOUNTER — Inpatient Hospital Stay
Admission: RE | Admit: 2017-01-18 | Discharge: 2017-01-20 | DRG: 743 | Disposition: A | Discharge status: Home / Self Care | Payer: BLUE CROSS/BLUE SHIELD | Source: Ambulatory Visit | Attending: Obstetrics and Gynecology | Admitting: Obstetrics and Gynecology

## 2017-01-18 ENCOUNTER — Encounter
Admission: RE | Disposition: A | Discharge status: Home / Self Care | Payer: Self-pay | Source: Ambulatory Visit | Attending: Obstetrics and Gynecology

## 2017-01-18 ENCOUNTER — Encounter: Payer: Self-pay | Admitting: *Deleted

## 2017-01-18 DIAGNOSIS — N83202 Unspecified ovarian cyst, left side: Secondary | ICD-10-CM | POA: Diagnosis present

## 2017-01-18 DIAGNOSIS — D649 Anemia, unspecified: Secondary | ICD-10-CM | POA: Diagnosis present

## 2017-01-18 DIAGNOSIS — N920 Excessive and frequent menstruation with regular cycle: Secondary | ICD-10-CM | POA: Diagnosis present

## 2017-01-18 DIAGNOSIS — F419 Anxiety disorder, unspecified: Secondary | ICD-10-CM | POA: Diagnosis present

## 2017-01-18 DIAGNOSIS — N736 Female pelvic peritoneal adhesions (postinfective): Secondary | ICD-10-CM | POA: Diagnosis present

## 2017-01-18 DIAGNOSIS — N9412 Deep dyspareunia: Secondary | ICD-10-CM | POA: Diagnosis present

## 2017-01-18 DIAGNOSIS — Z96651 Presence of right artificial knee joint: Secondary | ICD-10-CM | POA: Diagnosis present

## 2017-01-18 DIAGNOSIS — N946 Dysmenorrhea, unspecified: Secondary | ICD-10-CM | POA: Diagnosis present

## 2017-01-18 DIAGNOSIS — N939 Abnormal uterine and vaginal bleeding, unspecified: Secondary | ICD-10-CM | POA: Diagnosis present

## 2017-01-18 DIAGNOSIS — K219 Gastro-esophageal reflux disease without esophagitis: Secondary | ICD-10-CM | POA: Diagnosis present

## 2017-01-18 DIAGNOSIS — I10 Essential (primary) hypertension: Secondary | ICD-10-CM | POA: Diagnosis present

## 2017-01-18 DIAGNOSIS — F329 Major depressive disorder, single episode, unspecified: Secondary | ICD-10-CM | POA: Diagnosis present

## 2017-01-18 DIAGNOSIS — Z87891 Personal history of nicotine dependence: Secondary | ICD-10-CM

## 2017-01-18 DIAGNOSIS — D259 Leiomyoma of uterus, unspecified: Principal | ICD-10-CM | POA: Diagnosis present

## 2017-01-18 DIAGNOSIS — Z90710 Acquired absence of both cervix and uterus: Secondary | ICD-10-CM

## 2017-01-18 HISTORY — PX: ABDOMINAL HYSTERECTOMY: SHX81

## 2017-01-18 LAB — POCT PREGNANCY, URINE: PREG TEST UR: NEGATIVE

## 2017-01-18 SURGERY — HYSTERECTOMY, ABDOMINAL
Anesthesia: General | Laterality: Left | Wound class: Clean Contaminated

## 2017-01-18 MED ORDER — ACETAMINOPHEN 325 MG PO TABS: 650.0000 mg | ORAL_TABLET | ORAL | Status: DC | PRN

## 2017-01-18 MED ORDER — PANTOPRAZOLE SODIUM 40 MG PO TBEC
40.0000 mg | DELAYED_RELEASE_TABLET | Freq: Every day | ORAL | Status: DC
Start: 2017-01-19 — End: 2017-01-20
  Administered 2017-01-19 – 2017-01-20 (×2): 40 mg via ORAL
  Filled 2017-01-18 (×2): qty 1

## 2017-01-18 MED ORDER — PROPOFOL 10 MG/ML IV BOLUS
INTRAVENOUS | Status: AC
  Filled 2017-01-18: qty 20

## 2017-01-18 MED ORDER — ONDANSETRON HCL 4 MG/2ML IJ SOLN: 4.0000 mg | Freq: Once | INTRAMUSCULAR | Status: DC | PRN

## 2017-01-18 MED ORDER — MIDAZOLAM HCL 2 MG/2ML IJ SOLN
INTRAMUSCULAR | Status: DC | PRN
  Administered 2017-01-18: 2 mg via INTRAVENOUS

## 2017-01-18 MED ORDER — SERTRALINE HCL 25 MG PO TABS
50.0000 mg | ORAL_TABLET | Freq: Every day | ORAL | Status: DC
  Administered 2017-01-18 – 2017-01-20 (×3): 50 mg via ORAL
  Filled 2017-01-18 (×3): qty 2

## 2017-01-18 MED ORDER — KETOROLAC TROMETHAMINE 30 MG/ML IJ SOLN
30.0000 mg | Freq: Four times a day (QID) | INTRAMUSCULAR | Status: DC
  Filled 2017-01-18: qty 1

## 2017-01-18 MED ORDER — SUGAMMADEX SODIUM 200 MG/2ML IV SOLN
INTRAVENOUS | Status: AC
  Filled 2017-01-18: qty 2

## 2017-01-18 MED ORDER — LACTATED RINGERS IV SOLN: INTRAVENOUS | Status: DC

## 2017-01-18 MED ORDER — MIDAZOLAM HCL 2 MG/2ML IJ SOLN
INTRAMUSCULAR | Status: AC
  Filled 2017-01-18: qty 2

## 2017-01-18 MED ORDER — HYDROMORPHONE HCL 1 MG/ML IJ SOLN
1.0000 mg | INTRAMUSCULAR | Status: DC | PRN
  Administered 2017-01-18: 1 mg via INTRAVENOUS

## 2017-01-18 MED ORDER — LISINOPRIL 10 MG PO TABS
20.0000 mg | ORAL_TABLET | Freq: Every day | ORAL | Status: DC
  Administered 2017-01-19 – 2017-01-20 (×2): 20 mg via ORAL
  Filled 2017-01-18 (×2): qty 2

## 2017-01-18 MED ORDER — KETOROLAC TROMETHAMINE 30 MG/ML IJ SOLN
INTRAMUSCULAR | Status: DC | PRN
  Administered 2017-01-18: 30 mg via INTRAVENOUS

## 2017-01-18 MED ORDER — FENTANYL CITRATE (PF) 100 MCG/2ML IJ SOLN
INTRAMUSCULAR | Status: AC
  Administered 2017-01-18: 25 ug via INTRAVENOUS
  Filled 2017-01-18: qty 2

## 2017-01-18 MED ORDER — FENTANYL CITRATE (PF) 100 MCG/2ML IJ SOLN
INTRAMUSCULAR | Status: AC
  Filled 2017-01-18: qty 2

## 2017-01-18 MED ORDER — PHENYLEPHRINE HCL 10 MG/ML IJ SOLN
INTRAMUSCULAR | Status: DC | PRN
  Administered 2017-01-18: 100 ug via INTRAVENOUS
  Administered 2017-01-18 (×2): 200 ug via INTRAVENOUS

## 2017-01-18 MED ORDER — SIMETHICONE 80 MG PO CHEW: 80.0000 mg | CHEWABLE_TABLET | Freq: Four times a day (QID) | ORAL | Status: DC | PRN

## 2017-01-18 MED ORDER — CEFAZOLIN SODIUM-DEXTROSE 2-4 GM/100ML-% IV SOLN
INTRAVENOUS | Status: AC
  Filled 2017-01-18: qty 100

## 2017-01-18 MED ORDER — LACTATED RINGERS IV SOLN
INTRAVENOUS | Status: DC
  Administered 2017-01-18 – 2017-01-19 (×3): via INTRAVENOUS

## 2017-01-18 MED ORDER — BISACODYL 10 MG RE SUPP: 10.0000 mg | Freq: Every day | RECTAL | Status: DC | PRN

## 2017-01-18 MED ORDER — LIDOCAINE 5 % EX PTCH
MEDICATED_PATCH | CUTANEOUS | Status: AC
  Filled 2017-01-18: qty 1

## 2017-01-18 MED ORDER — LIDOCAINE 5 % EX PTCH
MEDICATED_PATCH | CUTANEOUS | Status: DC | PRN
  Administered 2017-01-18: 1 via TRANSDERMAL

## 2017-01-18 MED ORDER — PROPOFOL 10 MG/ML IV BOLUS
INTRAVENOUS | Status: DC | PRN
  Administered 2017-01-18: 130 mg via INTRAVENOUS
  Administered 2017-01-18: 30 mg via INTRAVENOUS

## 2017-01-18 MED ORDER — ROCURONIUM BROMIDE 100 MG/10ML IV SOLN
INTRAVENOUS | Status: DC | PRN
  Administered 2017-01-18: 20 mg via INTRAVENOUS
  Administered 2017-01-18: 30 mg via INTRAVENOUS

## 2017-01-18 MED ORDER — OXYCODONE-ACETAMINOPHEN 5-325 MG PO TABS
1.0000 | ORAL_TABLET | ORAL | Status: DC | PRN
  Administered 2017-01-18 – 2017-01-20 (×5): 2 via ORAL
  Administered 2017-01-20: 1 via ORAL
  Filled 2017-01-18 (×2): qty 2
  Filled 2017-01-18: qty 1
  Filled 2017-01-18 (×3): qty 2

## 2017-01-18 MED ORDER — LACTATED RINGERS IV SOLN
INTRAVENOUS | Status: DC
  Administered 2017-01-18: 08:00:00 via INTRAVENOUS

## 2017-01-18 MED ORDER — KETOROLAC TROMETHAMINE 30 MG/ML IJ SOLN
INTRAMUSCULAR | Status: AC
  Filled 2017-01-18: qty 1

## 2017-01-18 MED ORDER — ZOLPIDEM TARTRATE 5 MG PO TABS: 10.0000 mg | ORAL_TABLET | Freq: Every day | ORAL | Status: DC

## 2017-01-18 MED ORDER — MORPHINE SULFATE (PF) 2 MG/ML IV SOLN
1.0000 mg | INTRAVENOUS | Status: DC | PRN
  Administered 2017-01-18 (×3): 2 mg via INTRAVENOUS
  Filled 2017-01-18 (×3): qty 1

## 2017-01-18 MED ORDER — FENTANYL CITRATE (PF) 100 MCG/2ML IJ SOLN
INTRAMUSCULAR | Status: DC | PRN
  Administered 2017-01-18: 100 ug via INTRAVENOUS

## 2017-01-18 MED ORDER — ZOLPIDEM TARTRATE 5 MG PO TABS: 5.0000 mg | ORAL_TABLET | Freq: Every day | ORAL | Status: DC

## 2017-01-18 MED ORDER — EPHEDRINE SULFATE 50 MG/ML IJ SOLN
INTRAMUSCULAR | Status: DC | PRN
  Administered 2017-01-18: 5 mg via INTRAVENOUS

## 2017-01-18 MED ORDER — DOCUSATE SODIUM 100 MG PO CAPS
100.0000 mg | ORAL_CAPSULE | Freq: Two times a day (BID) | ORAL | Status: DC | PRN
  Administered 2017-01-20: 100 mg via ORAL
  Filled 2017-01-18: qty 1

## 2017-01-18 MED ORDER — LIDOCAINE HCL (CARDIAC) 20 MG/ML IV SOLN
INTRAVENOUS | Status: DC | PRN
  Administered 2017-01-18: 40 mg via INTRAVENOUS

## 2017-01-18 MED ORDER — FENTANYL CITRATE (PF) 100 MCG/2ML IJ SOLN: 50.0000 ug | Freq: Once | INTRAMUSCULAR | Status: DC

## 2017-01-18 MED ORDER — FENTANYL CITRATE (PF) 100 MCG/2ML IJ SOLN
25.0000 ug | INTRAMUSCULAR | Status: AC | PRN
Start: 2017-01-18 — End: 2017-01-18
  Administered 2017-01-18 (×6): 25 ug via INTRAVENOUS

## 2017-01-18 MED ORDER — DEXAMETHASONE SODIUM PHOSPHATE 10 MG/ML IJ SOLN
INTRAMUSCULAR | Status: DC | PRN
  Administered 2017-01-18: 5 mg via INTRAVENOUS

## 2017-01-18 MED ORDER — ACETAMINOPHEN NICU IV SYRINGE 10 MG/ML
INTRAVENOUS | Status: AC
  Filled 2017-01-18: qty 1

## 2017-01-18 MED ORDER — KETOROLAC TROMETHAMINE 30 MG/ML IJ SOLN
30.0000 mg | Freq: Four times a day (QID) | INTRAMUSCULAR | Status: DC
  Administered 2017-01-18 – 2017-01-20 (×8): 30 mg via INTRAVENOUS
  Filled 2017-01-18 (×8): qty 1

## 2017-01-18 MED ORDER — ONDANSETRON HCL 4 MG/2ML IJ SOLN
INTRAMUSCULAR | Status: DC | PRN
  Administered 2017-01-18: 4 mg via INTRAVENOUS

## 2017-01-18 MED ORDER — ACETAMINOPHEN 10 MG/ML IV SOLN
INTRAVENOUS | Status: DC | PRN
  Administered 2017-01-18: 1000 mg via INTRAVENOUS

## 2017-01-18 MED ORDER — HYDROMORPHONE HCL 1 MG/ML IJ SOLN
INTRAMUSCULAR | Status: AC
  Administered 2017-01-18: 1 mg via INTRAVENOUS
  Filled 2017-01-18: qty 1

## 2017-01-18 MED ORDER — SUGAMMADEX SODIUM 200 MG/2ML IV SOLN
INTRAVENOUS | Status: DC | PRN
  Administered 2017-01-18: 175 mg via INTRAVENOUS

## 2017-01-18 SURGICAL SUPPLY — 36 items
CANISTER SUCT 1200ML W/VALVE (MISCELLANEOUS) ×3 IMPLANT
CATH TRAY 16F METER LATEX (MISCELLANEOUS) ×3 IMPLANT
CHLORAPREP W/TINT 26ML (MISCELLANEOUS) ×6 IMPLANT
CLOSURE WOUND 1/2 X4 (GAUZE/BANDAGES/DRESSINGS) ×1
DRAPE LAPAROTOMY TRNSV 106X77 (MISCELLANEOUS) ×3 IMPLANT
DRSG TELFA 3X8 NADH (GAUZE/BANDAGES/DRESSINGS) ×3 IMPLANT
ELECT BLADE 6.5 EXT (BLADE) ×3 IMPLANT
ELECT CAUTERY BLADE 6.4 (BLADE) ×3 IMPLANT
ELECT REM PT RETURN 9FT ADLT (ELECTROSURGICAL) ×3
ELECTRODE REM PT RTRN 9FT ADLT (ELECTROSURGICAL) ×1 IMPLANT
GAUZE SPONGE 4X4 12PLY STRL (GAUZE/BANDAGES/DRESSINGS) ×3 IMPLANT
GLOVE BIO SURGEON STRL SZ 6.5 (GLOVE) ×10 IMPLANT
GLOVE BIO SURGEON STRL SZ8 (GLOVE) ×3 IMPLANT
GLOVE BIO SURGEONS STRL SZ 6.5 (GLOVE) ×5
GLOVE INDICATOR 7.0 STRL GRN (GLOVE) ×9 IMPLANT
GLOVE INDICATOR 8.0 STRL GRN (GLOVE) ×12 IMPLANT
GOWN STRL REUS W/ TWL LRG LVL3 (GOWN DISPOSABLE) ×2 IMPLANT
GOWN STRL REUS W/ TWL XL LVL3 (GOWN DISPOSABLE) ×1 IMPLANT
GOWN STRL REUS W/TWL LRG LVL3 (GOWN DISPOSABLE) ×4
GOWN STRL REUS W/TWL XL LVL3 (GOWN DISPOSABLE) ×2
IV SODIUM CHLORIDE IVPB 50ML (IV SOLUTION) ×3 IMPLANT
KIT RM TURNOVER STRD PROC AR (KITS) ×3 IMPLANT
LIGASURE IMPACT 36 18CM CVD LR (INSTRUMENTS) ×3 IMPLANT
NS IRRIG 1000ML POUR BTL (IV SOLUTION) ×6 IMPLANT
PACK BASIN MAJOR ARMC (MISCELLANEOUS) ×3 IMPLANT
STAPLER INSORB 30 2030 C-SECTI (MISCELLANEOUS) ×3 IMPLANT
STRIP CLOSURE SKIN 1/2X4 (GAUZE/BANDAGES/DRESSINGS) ×2 IMPLANT
SUT CHROMIC 0 CT 1 (SUTURE) ×6 IMPLANT
SUT MAXON ABS #0 GS21 30IN (SUTURE) ×6 IMPLANT
SUT SILK 0 (SUTURE) ×4
SUT SILK 0 30XBRD TIE 6 (SUTURE) ×2 IMPLANT
SUT VIC AB 0 CT1 27 (SUTURE) ×2
SUT VIC AB 0 CT1 27XCR 8 STRN (SUTURE) ×1 IMPLANT
SUT VIC AB 2-0 SH 27 (SUTURE) ×2
SUT VIC AB 2-0 SH 27XBRD (SUTURE) ×1 IMPLANT
WATER STERILE IRR 1000ML POUR (IV SOLUTION) IMPLANT

## 2017-01-18 NOTE — Op Note (Signed)
OPERATIVE NOTE:  Caitlyn [name] PROCEDURE DATE: 01/18/2017   PREOPERATIVE DIAGNOSIS:  1. Symptomatic fibroid uterus  POSTOPERATIVE DIAGNOSIS:  1. Symptomatic fibroid uterus 2. Pelvic adhesions. 3.. Left ovarian cyst  PROCEDURE: TAH LSO  SURGEON:  Caitlyn Mars, MD ASSISTANTS: Dr. Jeannie [name] ANESTHESIA: General INDICATIONS: 51 y.o. [id number] with symptomatic fibroid uterus, 16 week size, presents for definitive surgery. Patient has long history of abnormal uterine bleeding to anemia. Preoperative endometrial biopsy was benign. Patient desires definitive surgery through hysterectomy and oophorectomy.  FINDINGS:  Extensive omental pelvic sidewall adhesions; 16 week size multi-fibroid uterus; left ovarian cyst, simple   I/O's: Total I/O In: 1000 [I.V.:1000] Out: 900 [Urine:600; Blood:300] COUNTS:  YES SPECIMENS: Uterus with cervix, left fallopian tube and ovary ANTIBIOTIC PROPHYLAXIS:Ancef 2 grams COMPLICATIONS: None immediate  PROCEDURE IN DETAIL: Patient was brought to the operating room placed in supine position. General endotracheal anesthesia was induced without difficulty. A ChloraPrep and Hibiclens abdominal perineal intravaginal prep and drape was performed in standard fashion. Foley catheter was placed and was draining clear yellow urine. Timeout was completed. Pfannenstiel incision was made in the abdomen. Fascia was incised transversely and extended bilaterally with Mayo scissors. The rectus fascia was dissected off the muscle through sharp and blunt dissection. Midline raphae was identified and separated and peritoneum was opened. Extensive intra-abdominal adhesions between the omentum and posterior uterus and left adnexa are encountered. Sharp and blunt dissection is used to facilitate the restoration of normal anatomy. The LigaSure impact instrument is used in addition to standard protocols to perform the hysterectomy. The left utero-ovarian ligament was clamped  coagulated and cut with the LigaSure instrument. The left round ligament is clamped coagulated and cut. The cardinal broad ligament complexes likewise were taken down with the LigaSure instrument. The procedure was carried out bilaterally to take down the cardinal broad ligament complexes. The anterior posterior leafs of broad ligament were opened sharply. The bladder flap was created using Metzenbaum scissors. The uterine arteries are clamped coagulated and cut with LigaSure instrument. Lower uterine segment pedicles are then taken down in standard fashion using straight Heaney clamps along with Mayo scissors and suture ligatures. The uterus is supracervically removed with a scalpel. The cervical stump was grasped with double-tooth tenaculum and the remaining pedicles were taken down using a clamping cutting and stick tying technique. At the level of the cervicovaginal junction, curved Heaney clamps were used to cross-clamp the pedicles and the cervix was excised from the operative field. Heaney stitches were used to close the angles of the vagina. The intervening vaginal cuff was closed with figure-of-eight sutures of 0 Vicryl. The left tube and ovary are removed with  the LigaSure impact instrument. The adnexa  was grasped with a Babcock clamp and the instrument was kept away from the functional ureter. Good hemostasis was noted. Pelvis was copiously irrigated. Hemostasis is optimized with Bovie cautery. Following inspection of the pelvis and abdomen, incision was closed using 0 Maxon on the fascia in a simple running manner. Subcutaneous tissues were reapproximated using 2-0 Vicryl suture. The skin was closed with absorbable staples. Steri-Strips are applied to the wound. The incision is covered with Telfa anda 4 x 4 pad dressingf. Patient was then awakened extubated and taken to recovery room in satisfactory condition.  Caitlyn [name] A. Caitlyn Plants, MD, ACO[facility]

## 2017-01-18 NOTE — Anesthesia Preprocedure Evaluation (Signed)
Anesthesia Evaluation  Patient identified by MRN, date of birth, ID band Patient awake    Reviewed: Allergy & Precautions, NPO status , Patient's Chart, lab work & pertinent test results, reviewed documented beta blocker date and time   Airway Mallampati: III  TM Distance: >3 FB     Dental  (+) Chipped   Pulmonary former smoker,           Cardiovascular hypertension, Pt. on medications      Neuro/Psych PSYCHIATRIC DISORDERS Anxiety Depression    GI/Hepatic GERD  Controlled,  Endo/Other    Renal/GU      Musculoskeletal  (+) Arthritis ,   Abdominal   Peds  Hematology  (+) anemia ,   Anesthesia Other Findings   Reproductive/Obstetrics                             Anesthesia Physical Anesthesia Plan  ASA: III  Anesthesia Plan: General   Post-op Pain Management:    Induction: Intravenous  Airway Management Planned: Oral ETT  Additional Equipment:   Intra-op Plan:   Post-operative Plan:   Informed Consent: I have reviewed the patients History and Physical, chart, labs and discussed the procedure including the risks, benefits and alternatives for the proposed anesthesia with the patient or authorized representative who has indicated his/her understanding and acceptance.     Plan Discussed with: CRNA  Anesthesia Plan Comments:         Anesthesia Quick Evaluation

## 2017-01-18 NOTE — Transfer of Care (Signed)
Immediate Anesthesia Transfer of Care Note  Patient: Caitlyn [name]  Procedure(s) Performed: Procedure(s): HYSTERECTOMY ABDOMINAL WITH LEFT SALPINGO OOPHERECTOMY (Left)  Patient Location: PACU  Anesthesia Type:General  Level of Consciousness: sedated  Airway & Oxygen Therapy: Patient Spontanous Breathing and Patient connected to face mask oxygen  Post-op Assessment: Report given to RN and Post -op Vital signs reviewed and stable  Post vital signs: Reviewed and stable  Last Vitals:  Vitals:   01/18/17 0801 01/18/17 1145  BP: (!) 143/94 112/64  Pulse: 76 73  Resp: 18 14  Temp: 37.1 C 36.4 C    Last Pain:  Vitals:   01/18/17 1145  TempSrc: Temporal         Complications: No apparent anesthesia complications

## 2017-01-18 NOTE — H&P (View-Only) (Signed)
Subjective:  PREOPERATIVE HISTORY AND PHYSICAL   Date of surgery: 01/18/2017 Procedure: Total abdominal hysterectomy bilateral salpingectomy and left oophorectomy Diagnosis: Symptomatic fibroid uterus-18 week size   Patient is a 51 y.o. [id number] scheduled for TAH bilateral salpingectomy and left oophorectomy on 01/18/2017. Patient has long history of menometrorrhagia with bleeding upwards of 10-11 days per month. Endometrial biopsy preoperatively is benign. 12/22/2016 Diagnosis:  ENDOMETRIUM, BIOPSY:  WEAKLY PROLIFERATIVE ENDOMETRIUM. NO HYPERPLASIA OR CARCINOMA.  COMMENT: Based on the initial morphologic findings  immunohistochemical stain is evaluated. Immunohistochemical stain  for CD-138 does not show any plasma cells in the stroma and rules  out chronic endometritis.  OOD/12/25/2016   Patient is currently taking norethindrone acetate 15 mg a day to suppress bleeding. CBC Latest Ref Rng & Units 11/26/2016 07/09/2016 07/08/2016  WBC 3.6 - 11.0 K/uL 12.2(H) 16.0(H) 18.6(H)  Hemoglobin 12.0 - 16.0 g/dL 10.9(L) 10.2(L) 10.7(L)  Hematocrit 35.0 - 47.0 % 34.0(L) 30.5(L) 32.5(L)  Platelets 150 - 440 K/uL 372 205 235    Pertinent Gynecological History: Past GYN history: Menarche-Age 83 Cycles- monthly Duration of flow- 5 days(2H, 3L) Dysmenorrhea-mild; central and left lower quadrant cramping with minimal low back pain; occasionally takes Advil for cramps Deep thrusting dyspareunia is noted No history of abnormal Pap smears. No history of STI's. Menorrhagia, worsening with new diagnosis of anemia Ultrasound 06/19/2011-uterus measures 7.0 x 5.9 cm; endometrial stripe 1.9 cm; right and left ovaries normal; free fluid in cul-de-sac  Ultrasound (10/23/2015) Location: [facility] Date of Service: 10/23/15   Indications: Menorrhagia  Findings:  The uterus is enlarged. It measures 13.1 x 9.2 x 9.1 cm. Echo texture is heterogenous with evidence of  afocal mass. Within  the uterus is a suspected fibroid measuring: Fibroid 1: Anterior fundus, subserosal, 8.3 x 6.0 x 6.4 cm with internal vascular flow seen.  The Endometrium is thickened with a small amount of free fluid seen within ( pt has just had an endometrial biopsy before ultrasound). It measures 17.1 mm.  Right ovary is surgically absent. Left Ovary measures 4.8 x 2.8 x 3.4 cm. It appears WNL. Survey of the adnexa demonstrates no adnexal masses. There is no free fluid in the cul de sac.  Impression: 1. Enlarged fibroid uterus. 2. S/P endometrial biopsy today.   Recommendations: 1.Clinical correlation with the patient's History and Physical Exam.   Elliott,Teresa, Rad Tech  Brayton Mars, MD  Discussed Blood/Blood Products: yes   OB History    Gravida Para Term Preterm AB Living   4 3 3   1 3    SAB TAB Ectopic Multiple Live Births   1       3      Patient's last menstrual period was 01/07/2017 (exact date).    Past Medical History:  Diagnosis Date  . Anemia   . Anxiety   . Arthritis   . Depression   . GERD (gastroesophageal reflux disease)   . Heart burn   . Hypertension     Past Surgical History:  Procedure Laterality Date  . CHOLECYSTECTOMY    . CHOLECYSTECTOMY, LAPAROSCOPIC    . JOINT REPLACEMENT    . KNEE ARTHROSCOPY Left 12/03/2016   Procedure: ARTHROSCOPY KNEE, partial medial menisectomy;  Surgeon: Hessie Knows, MD;  Location: [facility] ORS;  Service: Orthopedics;  Laterality: Left;  . LAPAROSCOPIC OVARIAN CYSTECTOMY    . MENISCECTOMY Right   . TOTAL KNEE ARTHROPLASTY Right 07/07/2016   Procedure: TOTAL KNEE ARTHROPLASTY;  Surgeon: Hessie Knows, MD;  Location: [facility]  ORS;  Service: Orthopedics;  Laterality: Right;  . TUBAL LIGATION      OB History  Gravida Para Term Preterm AB Living  4 3 3   1 3   SAB TAB Ectopic Multiple Live Births  1       3    # Outcome Date GA Lbr Len/2nd Weight Sex Delivery Anes PTL Lv  4 Term 02/02/91    [address]   LIV  3 SAB  1992        FD  2 Term 04/16/89    Jerilynn Mages Vag-Spont   LIV  1 Term 06/24/84    Jerilynn Mages Vag-Spont   LIV      Social History   Social History  . Marital status: Single    Spouse name: N/A  . Number of children: N/A  . Years of education: N/A   Social History Main Topics  . Smoking status: Former Smoker    Packs/day: 0.50    Types: Cigarettes    Quit date: 03/01/2015  . Smokeless tobacco: Never Used  . Alcohol use 0.0 oz/week     Comment: rarely  . Drug use: No  . Sexual activity: Yes    Partners: Male    Birth control/ protection: Surgical   Other Topics Concern  . None   Social History Narrative  . None    Family History  Problem Relation Age of Onset  . Osteoarthritis Mother   . Heart failure Father   . Diabetes Father   . Hypertension Father   . Osteoarthritis Sister      (Not in a hospital admission)  No Known Allergies  Review of Systems Constitutional: No recent fever/chills/sweats Respiratory: No recent cough/bronchitis Cardiovascular: No chest pain Gastrointestinal: No recent nausea/vomiting/diarrhea Genitourinary: No UTI symptoms Hematologic/lymphatic:No history of coagulopathy or recent blood thinner use    Objective:    BP 137/79   Pulse 77   Ht 5\' 1"  (1.549 m)   Wt 181 lb 1.6 oz (82.1 kg)   LMP 01/07/2017 (Exact Date)   BMI 34.22 kg/m   General:   Normal  Skin:   normal  HEENT:  Normal  Neck:  Supple without Adenopathy or Thyromegaly  Lungs:   Heart:              Breasts:   Abdomen:  Pelvis:  M/S   Extremeties:  Neuro:    clear to auscultation bilaterally   Normal without murmur   Not Examined   soft, non-tender; bowel sounds normal; no masses,  no organomegaly   Exam deferred to OR  No CVAT  Warm/Dry   Normal          Assessment:    1. 18 week size fibroid uterus 2. Menorrhagia to anemia   Plan:  TAH bilateral salpingectomy and left oophorectomy    preop counseling: The patient is to undergo hysterectomy bilateral  salpingectomy and left oophorectomy for symptomatic fibroid uterus-18 week size on 01/18/2017. She is understanding of the planned procedure and is aware of and is accepting of all surgical risks which include but are not limited to bleeding, infection, pelvic organ injury with need for repair, blood clot disorders, anesthesia risks, etc. All questions have been answered. Informed consent is given. Patient is ready and willing to proceed with surgery as scheduled.  Brayton Mars, MD  Note: This dictation was prepared with Dragon dictation along with smaller phrase technology. Any transcriptional errors that result from this process are unintentional.

## 2017-01-18 NOTE — Interval H&P Note (Signed)
History and Physical Interval Note:  01/18/2017 8:52 AM  Caitlyn [name]  has presented today for surgery, with the diagnosis of menorrhagia, uterine leiomyoma  The various methods of treatment have been discussed with the patient and family. After consideration of risks, benefits and other options for treatment, the patient has consented to  Procedure(s): HYSTERECTOMY ABDOMINAL WITH LEFT SALPINGO OOPHERECTOMY (Left) as a surgical intervention .  The patient's history has been reviewed, patient examined, no change in status, stable for surgery.  I have reviewed the patient's chart and labs.  Questions were answered to the patient's satisfaction.     Hassell Done A Shams Fill

## 2017-01-18 NOTE — Anesthesia Post-op Follow-up Note (Cosign Needed)
Anesthesia QCDR form completed.        

## 2017-01-18 NOTE — Anesthesia Postprocedure Evaluation (Signed)
Anesthesia Post Note  Patient: Caitlyn [name]  Procedure(s) Performed: Procedure(s) (LRB): HYSTERECTOMY ABDOMINAL WITH LEFT SALPINGO OOPHERECTOMY (Left)  Patient location during evaluation: PACU Anesthesia Type: General Level of consciousness: awake and alert Pain management: pain level controlled Vital Signs Assessment: post-procedure vital signs reviewed and stable Respiratory status: spontaneous breathing, nonlabored ventilation, respiratory function stable and patient connected to nasal cannula oxygen Cardiovascular status: blood pressure returned to baseline and stable Postop Assessment: no signs of nausea or vomiting Anesthetic complications: no     Last Vitals:  Vitals:   01/18/17 1340 01/18/17 1440  BP: (!) 121/56 105/62  Pulse: 78 62  Resp: 16 17  Temp: 36.8 C 36.7 C    Last Pain:  Vitals:   01/18/17 1441  TempSrc:   PainSc: [place]

## 2017-01-19 ENCOUNTER — Encounter: Payer: Self-pay | Admitting: Obstetrics and Gynecology

## 2017-01-19 LAB — HEMOGLOBIN: Hemoglobin: 7.8 g/dL — ABNORMAL LOW (ref 12.0–16.0)

## 2017-01-19 NOTE — Progress Notes (Signed)
1 Day Post-Op Procedure(s) (LRB): HYSTERECTOMY ABDOMINAL WITH LEFT SALPINGO OOPHERECTOMY (Left)  Subjective: Patient reports incisional pain, tolerating PO and no problems voiding.    Objective: I have reviewed patient's vital signs, intake and output, medications and labs.  Resp: Breathing nonlabored Cardio: regular rate and rhythm GI: soft, non-tender; bowel sounds normal; no masses,  no organomegaly and incision: Dressing dry Extremities: extremities normal, atraumatic, no cyanosis or edema and Homans sign is negative, no sign of DVT Vaginal Bleeding: none  Assessment: s/p Procedure(s): HYSTERECTOMY ABDOMINAL WITH LEFT SALPINGO OOPHERECTOMY (Left): progressing well and tolerating diet  Plan: Advance diet Encourage ambulation Advance to PO medication  LOS: 1 day    Hassell Done A Notnamed Scholz 01/19/2017, 8:16 AM

## 2017-01-19 NOTE — Progress Notes (Signed)
Spoke with patient with virtual [place] interpreter. Educated pnt about pain control and requesting medication before pain level is greater than 5/10. pnt verbalized understanding. Pnt  Denied having any question or concerns.

## 2017-01-20 DIAGNOSIS — Z90710 Acquired absence of both cervix and uterus: Secondary | ICD-10-CM

## 2017-01-20 MED ORDER — DOCUSATE SODIUM 100 MG PO CAPS: 100.0000 mg | ORAL_CAPSULE | Freq: Two times a day (BID) | ORAL | 0 refills | Status: DC | PRN

## 2017-01-20 MED ORDER — FERROUS SULFATE 325 (65 FE) MG PO TABS: 325.0000 mg | ORAL_TABLET | Freq: Two times a day (BID) | ORAL | 0 refills | Status: DC

## 2017-01-20 MED ORDER — OXYCODONE-ACETAMINOPHEN 5-325 MG PO TABS: 1.0000 | ORAL_TABLET | ORAL | 0 refills | Status: DC | PRN

## 2017-01-20 MED ORDER — IBUPROFEN 800 MG PO TABS: 800.0000 mg | ORAL_TABLET | Freq: Three times a day (TID) | ORAL | 1 refills | Status: DC

## 2017-01-20 NOTE — Progress Notes (Addendum)
0930: Pt ambulated in hallway this AM. She was able to walk around nurses station x 2. She has no complaints other than increased pain with ambulation at her surgical site.   1055: Pt ambulated for second time in hallway with sister. She was able to ambulate around the nurses station x 2.

## 2017-01-20 NOTE — Discharge Summary (Signed)
Physician Discharge Summary  Patient ID: Ameya Vowell MRN: [id number] DOB/AGE: [birth year] 51 y.o.  Admit date: 01/18/2017 Discharge date: 01/20/2017  Admission Diagnoses: Menorrhagia, Chronic Pelvic Pain and Fibroids  Discharge Diagnoses:  SAA and Post Op Anemia, Asymptomatic  Operative Procedures: Procedure(s): HYSTERECTOMY ABDOMINAL WITH LEFT SALPINGO OOPHERECTOMY (Left)  Hospital Course: Uncomplicated.   Significant Diagnostic Studies:  Lab Results  Component Value Date   HGB 7.8 (L) 01/19/2017   HGB 9.9 (L) 01/13/2017   HGB 10.9 (L) 11/26/2016   Lab Results  Component Value Date   HCT 31.2 (L) 01/13/2017   HCT 34.0 (L) 11/26/2016   HCT 30.5 (L) 07/09/2016   CBC Latest Ref Rng & Units 01/19/2017 01/13/2017 11/26/2016  WBC 3.6 - 11.0 K/uL - 9.6 12.2(H)  Hemoglobin 12.0 - 16.0 g/dL 7.8(L) 9.9(L) 10.9(L)  Hematocrit 35.0 - 47.0 % - 31.2(L) 34.0(L)  Platelets 150 - 440 K/uL - 300 372     Discharged Condition: good  Discharge Exam: Blood pressure (!) 114/59, pulse 73, temperature 97.8 F (36.6 C), temperature source Axillary, resp. rate 16, last menstrual period 01/07/2017, SpO2 100 %. Incision/Wound: clean, dry and no drainage  Disposition: 01-Home or Self Care  Discharge Instructions    Discharge patient    Complete by:  As directed    Discharge disposition:  01-Home or Self Care   Discharge patient date:  01/20/2017     Allergies as of 01/20/2017   No Known Allergies     Medication List    STOP taking these medications   norethindrone 5 MG tablet Commonly known as:  AYGESTIN   traMADol 50 MG tablet Commonly known as:  ULTRAM     TAKE these medications   aspirin EC 325 MG tablet Take 162.5 mg by mouth daily.   docusate sodium 100 MG capsule Commonly known as:  COLACE Take 1-2 capsules (100-200 mg total) by mouth 2 (two) times daily as needed (for constipation).   ferrous sulfate 325 (65 FE) MG tablet Take 1 tablet (325 mg total) by mouth  2 (two) times daily with a meal.   ibuprofen 800 MG tablet Commonly known as:  ADVIL,MOTRIN Take 1 tablet (800 mg total) by mouth 3 (three) times daily.   lisinopril 20 MG tablet Commonly known as:  PRINIVIL,ZESTRIL Take 20 mg by mouth daily with breakfast.   omeprazole 20 MG capsule Commonly known as:  PRILOSEC Take 20 mg by mouth daily as needed (for stomach issues/heartburn/indigestion).   oxyCODONE-acetaminophen 5-325 MG tablet Commonly known as:  PERCOCET/ROXICET Take 1-2 tablets by mouth every 4 (four) hours as needed (moderate to severe pain (when tolerating fluids)).   sertraline 50 MG tablet Commonly known as:  ZOLOFT Take 50 mg by mouth daily at 2 PM.   zolpidem 10 MG tablet Commonly known as:  AMBIEN Take 10 mg by mouth at bedtime.      Follow-up Information    Vuong Musa, Alanda Slim, MD. Go in 1 week(s).   Specialties:  Obstetrics and Gynecology, Radiology Why:  Post Op check Contact information: [address] [place] [address] [phone]           Signed: Alanda Slim Yancy Knoble 01/20/2017, 1:03 PM

## 2017-01-20 NOTE — Progress Notes (Signed)
Patient is to be discharged home today. Patient is in no acute distress at this time, and assessment is unchanged from this morning. Patient's IV is out, discharge paperwork has been discussed with patient/family and there are no questions or concerns at this time. Pt is aware of follow-up appointment on 3/29. Discussed medication list with pt, and per Dr. Enzo Bi I emphasized the need for her to start PO iron BID for 1 month. Patient will be accompanied downstairs by volunteer staff and family via wheelchair.

## 2017-01-20 NOTE — Discharge Instructions (Signed)
Histerectoma abdominal (Abdominal Hysterectomy) La histerectoma abdominal es un procedimiento quirrgico en el que se extirpa el tero. El tero es el rgano muscular donde se desarrolla el feto. Esta ciruga puede hacerse por muchos motivos. Puede necesitar una histerectoma abdominal si tiene cncer, tumores, dolor a largo plazo o hemorragia. Tambin pueden hacerle este procedimiento si el tero ha descendido hacia la vagina (prolapso uterino). Segn las causas por las que necesite una histerectoma abdominal, es posible que tambin le extirpen otros rganos del aparato reproductor. Estos podran incluir la parte de la vagina que se conecta con el tero (cuello del tero), los rganos que producen vulos (ovarios) y las trompas que conectan los ovarios con el tero (trompas de Falopio). INFORME AL MDICO:  Cualquier alergia que tenga.  Todos los medicamentos que utiliza, incluidos vitaminas, hierbas, gotas oftlmicas, cremas y medicamentos de venta libre.  Problemas previos que usted o los miembros de su familia hayan tenido con el uso de anestsicos.  Enfermedades de la sangre que tenga.  Cirugas previas.  Enfermedades que tenga. RIESGOS Y COMPLICACIONES En general, se trata de un procedimiento seguro. Sin embargo, como en cualquier procedimiento, pueden surgir problemas. La infeccin es el problema ms frecuente despus de una histerectoma abdominal. Otros problemas posibles incluyen:  Hemorragias.  Formacin de cogulos sanguneos que pueden desprenderse y llegar a los pulmones.  Lesin en otros rganos cercanos al tero.  Lesin en los nervios que causa neuralgia.  Menor inters sexual o dolor durante las relaciones sexuales. ANTES DEL PROCEDIMIENTO  La histerectoma abdominal es un procedimiento quirrgico mayor. Puede afectar la percepcin que tiene de usted misma. Hable con el mdico sobre los cambios fsicos y emocionales que puede causar la histerectoma.  Quiz deban  hacerle anlisis de sangre y radiografas antes de la ciruga.  Si fuma, deje de hacerlo. Pdale ayuda al mdico si est teniendo inconvenientes para dejar de fumar.  Deje de tomar medicamentos anticoagulantes segn las indicaciones del mdico.  Pueden indicarle que tome antibiticos o laxantes antes de la ciruga.  No coma ni beba nada durante las 6 a 8 horas previas a la ciruga.  Tome sus medicamentos habituales con un sorbito de agua.  Tome un bao de inmersin o una ducha la noche o la maana anterior al procedimiento. PROCEDIMIENTO  La histerectoma abdominal se hace en el quirfano del hospital.  En la mayora de los casos, se le administrar un medicamento que la har dormir (anestesia general).  El cirujano har un corte (incisin) a travs de la piel en la parte inferior del abdomen.  La incisin puede tener de 5a 7pulgadas de largo. Puede ser horizontal o vertical.  El cirujano apartar el tejido que recubre al tero. Luego, extraer con cuidado el tero junto con cualquier otro rgano del aparato reproductor que necesite extirpar.  La hemorragia se controlar con pinzas o suturas.  El cirujano cerrar la incisin con suturas o clips metlicos. DESPUS DEL PROCEDIMIENTO  Sentir algo de dolor inmediatamente despus del procedimiento.  Le administrarn medicamentos para calmar el dolor cuando est en el rea de recuperacin.  La llevarn a la habitacin del hospital cuando se haya recuperado de la anestesia.  Es posible que deba permanecer en el hospital durante 2a 5das.  Recibir instrucciones para recuperarse en su casa. Esta informacin no tiene como fin reemplazar el consejo del mdico. Asegrese de hacerle al mdico cualquier pregunta que tenga. Document Released: 08/22/2013 Document Revised: 08/22/2013 Document Reviewed: 06/09/2013 Elsevier Interactive Patient Education  2017 [employer]vier Inc.  

## 2017-01-22 ENCOUNTER — Telehealth: Payer: Self-pay | Admitting: Obstetrics and Gynecology

## 2017-01-22 NOTE — Telephone Encounter (Signed)
lmtrc

## 2017-01-22 NOTE — Telephone Encounter (Signed)
Patient submitted script to be filled with [facility] and she has filled another script @ [facility] on [address] 2 days ago and she is wanting this one filled today  Please call ASAP to confirm that she should have another script filled today of the same medication

## 2017-01-26 ENCOUNTER — Ambulatory Visit (INDEPENDENT_AMBULATORY_CARE_PROVIDER_SITE_OTHER): Payer: BLUE CROSS/BLUE SHIELD | Admitting: Obstetrics and Gynecology

## 2017-01-26 ENCOUNTER — Encounter: Payer: Self-pay | Admitting: Obstetrics and Gynecology

## 2017-01-26 VITALS — BP 138/74 | HR 94 | Ht 64.0 in | Wt 176.7 lb

## 2017-01-26 DIAGNOSIS — Z09 Encounter for follow-up examination after completed treatment for conditions other than malignant neoplasm: Secondary | ICD-10-CM

## 2017-01-26 DIAGNOSIS — R102 Pelvic and perineal pain: Secondary | ICD-10-CM

## 2017-01-26 DIAGNOSIS — Z90710 Acquired absence of both cervix and uterus: Secondary | ICD-10-CM

## 2017-01-26 DIAGNOSIS — D259 Leiomyoma of uterus, unspecified: Secondary | ICD-10-CM

## 2017-01-26 DIAGNOSIS — E8941 Symptomatic postprocedural ovarian failure: Secondary | ICD-10-CM | POA: Insufficient documentation

## 2017-01-26 DIAGNOSIS — Z9079 Acquired absence of other genital organ(s): Secondary | ICD-10-CM

## 2017-01-26 DIAGNOSIS — Z90721 Acquired absence of ovaries, unilateral: Secondary | ICD-10-CM

## 2017-01-26 DIAGNOSIS — N920 Excessive and frequent menstruation with regular cycle: Secondary | ICD-10-CM

## 2017-01-26 LAB — POCT URINALYSIS DIPSTICK
Bilirubin, UA: NEGATIVE
Blood, UA: NEGATIVE
Glucose, UA: NEGATIVE
KETONES UA: NEGATIVE
LEUKOCYTES UA: NEGATIVE
NITRITE UA: NEGATIVE
PROTEIN UA: NEGATIVE
Spec Grav, UA: 1.01 (ref 1.010–1.025)
Urobilinogen, UA: 0.2 E.U./dL
pH, UA: 6 (ref 5.0–8.0)

## 2017-01-26 MED ORDER — ESTRADIOL 1 MG PO TABS: 1.0000 mg | ORAL_TABLET | Freq: Every day | ORAL | 12 refills | Status: DC

## 2017-01-26 NOTE — Progress Notes (Signed)
Chief complaint: 1. One week postop check 2. Status post TAH LSO 3. History of menorrhagia, uterine fibroids, and pelvic pain  Patient presents for 1 week postop check status post TAH LSO for symptomatic fibroids. Pathology is pending at this time. Bowel function is normal. Bladder function is notable for some dysuria without frequency or urgency. She is experiencing some vasomotor symptoms and mood swings.  OBJECTIVE: BP 138/74   Pulse 94   Ht 5\' 4"  (1.626 m)   Wt 176 lb 11.2 oz (80.2 kg)   LMP 01/07/2017 (Exact Date)   BMI 30.33 kg/m  Pleasant female in no acute distress. Alert and oriented. Abdomen: Soft, nontender; Pfannenstiel incision is well approximated, Steri-Strips intact, absorbable staples intact Extremities: Warm and dry, nontender  ASSESSMENT: 1. Normal postop check 1 week status post TAH LSO 2. 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 Postop anemia, asymptomatic  PLAN: 1. Continue with routine postoperative 2. Begin estradiol 1 mg a day 3. Continue with iron supplementation twice a day 4. Continue with ibuprofen and Tylenol for pain relief 5. Return in 5 weeks for final postop check 6. Urinalysis and urine culture is obtained to rule out UTI  Brayton Mars, MD  Note: This dictation was prepared with Dragon dictation along with smaller phrase technology. Any transcriptional errors that result from this process are unintentional.

## 2017-01-26 NOTE — Addendum Note (Signed)
Addended by: Elouise Munroe on: 01/26/2017 12:22 PM   Modules accepted: Orders

## 2017-01-26 NOTE — Telephone Encounter (Signed)
Caitlyn [name] states he received 2 percocet rx with in  Few days of each other. Pt picked up med on 5/23. She has her 2 week post-op today. Aware to shred the rx. If mad wants her to have another, he will print one for her.

## 2017-01-26 NOTE — Patient Instructions (Signed)
1. Begin estradiol 1 mg daily for hot flashes 2. Continue taking ibuprofen and Tylenol for pain relief 3. Gradually increase activities as tolerated 4. Return in 5 weeks for final postop check

## 2017-01-27 ENCOUNTER — Telehealth: Payer: Self-pay | Admitting: Obstetrics and Gynecology

## 2017-01-27 LAB — SURGICAL PATHOLOGY

## 2017-01-27 NOTE — Telephone Encounter (Signed)
Please review pathology results and call if you have any questions or concerns

## 2017-01-28 LAB — URINE CULTURE: Organism ID, Bacteria: NO GROWTH

## 2017-01-29 ENCOUNTER — Telehealth: Payer: Self-pay | Admitting: Obstetrics and Gynecology

## 2017-01-29 NOTE — Telephone Encounter (Signed)
Please call patient (Interpreter needed) with test results

## 2017-01-29 NOTE — Telephone Encounter (Signed)
Pt aware urine culture is neg. Advised to drink a lot of h20. Cranberry juice or tablets. If severe pain, fever, or back pain to contact office.

## 2017-02-12 ENCOUNTER — Other Ambulatory Visit: Payer: Self-pay | Admitting: Orthopedic Surgery

## 2017-02-12 DIAGNOSIS — M1712 Unilateral primary osteoarthritis, left knee: Secondary | ICD-10-CM

## 2017-02-24 ENCOUNTER — Ambulatory Visit (INDEPENDENT_AMBULATORY_CARE_PROVIDER_SITE_OTHER): Payer: BLUE CROSS/BLUE SHIELD | Admitting: Obstetrics and Gynecology

## 2017-02-24 ENCOUNTER — Encounter: Payer: Self-pay | Admitting: Obstetrics and Gynecology

## 2017-02-24 VITALS — BP 131/80 | HR 75 | Ht 64.0 in | Wt 182.2 lb

## 2017-02-24 DIAGNOSIS — Z09 Encounter for follow-up examination after completed treatment for conditions other than malignant neoplasm: Secondary | ICD-10-CM

## 2017-02-24 DIAGNOSIS — E8941 Symptomatic postprocedural ovarian failure: Secondary | ICD-10-CM

## 2017-02-24 DIAGNOSIS — N809 Endometriosis, unspecified: Secondary | ICD-10-CM

## 2017-02-24 DIAGNOSIS — D4959 Neoplasm of unspecified behavior of other genitourinary organ: Secondary | ICD-10-CM

## 2017-02-24 DIAGNOSIS — R3915 Urgency of urination: Secondary | ICD-10-CM

## 2017-02-24 DIAGNOSIS — D259 Leiomyoma of uterus, unspecified: Secondary | ICD-10-CM

## 2017-02-24 MED ORDER — NYSTATIN-TRIAMCINOLONE 100000-0.1 UNIT/GM-% EX OINT: 1.0000 "application " | TOPICAL_OINTMENT | Freq: Two times a day (BID) | CUTANEOUS | 1 refills | Status: DC

## 2017-02-24 NOTE — Patient Instructions (Addendum)
  1. Resume all activities without restriction 2. Return in 1 year for annual exam 3. Begin taking Estrace 1 mg a day if vasomotor symptoms worsenc 4. Referral to urology for evaluation of urinary urgency

## 2017-02-24 NOTE — Progress Notes (Signed)
Chief complaint: 1. 6 week postop check 2. Status post TAH LSO 3. History of menorrhagia, uterine fibroids, and pelvic pain  Patient presents for 1 week postop check status post TAH LSO for symptomatic fibroids. Pathology discussed with patient.  Patient reports very mild vasomotor symptoms and she is not taking ERT at this time.  She denies any abdominal/incisional pain. Bowel function normal.  She states she has, "deep buring when I urinate" and pain with urination.  She denies use of pain medication and states she is feeling much better.  Pathology:   DIAGNOSIS:  A. UTERUS WITH CERVIX, LEFT FALLOPIAN TUBE AND OVARY; HYSTERECTOMY WITH  LEFT SALPINGO-OOPHORECTOMY:  - ACUTE AND CHRONIC CERVICITIS WITH NABOTHIAN CYSTS.  - WEAKLY PROLIFERATIVE ENDOMETRIUM.  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  - ENDOMETRIOSIS.  - SERTOLI-LEYDIG TUMOR, WELL-DIFFERENTIATED (1 CM).  OBJECTIVE: BP 138/74   Pulse 94   Ht 5\' 4"  (1.626 m)   Wt 176 lb 11.2 oz (80.2 kg)   LMP 01/07/2017 (Exact Date)   BMI 30.33 kg/m  Pleasant female in no acute distress. Alert and oriented. Abdomen: Soft, nontender; Pfannenstiel incision is healed. Superficial sensory loss. PELVIC:   External Genitalia: Normal  BUS: Normal  Vagina: good vault support, no lesions, mild anterior wall tenderness. Vaginal cuff well healed. Mild induration at cuff as expected with out mass or tenderness  Cervix: Surgically absent  Uterus: Surgically absent  Adnexa: L surgically absent, R nontender, non palpable Extremities: Warm and dry, nontender  ASSESSMENT: 1. Normal postop check 6 weeks status post TAH LSO 2. Surgical menopause with mild vasomotor symptoms 3. Dysuria  PLAN: 1.  Follow up as 1 year 2.  Estradiol perscribed for vasomotor symptoms 3.  Refer to urology for persistent urinary symptoms   Edward Qualia, PA-S [facility] Brayton Mars, MD   I have seen,  interviewed, and examined the patient in conjunction with the [facility].A. student and affirm the diagnosis and management plan. Efe Fazzino A. Arless Vineyard, MD, FACOG   Note: This dictation was prepared with Dragon dictation along with smaller phrase technology. Any transcriptional errors that result from this process are unintentional.

## 2017-02-25 ENCOUNTER — Encounter: Payer: BLUE CROSS/BLUE SHIELD | Admitting: Obstetrics and Gynecology

## 2017-03-02 ENCOUNTER — Encounter: Payer: BLUE CROSS/BLUE SHIELD | Admitting: Obstetrics and Gynecology

## 2017-03-04 ENCOUNTER — Encounter
Admission: RE | Admit: 2017-03-04 | Discharge: 2017-03-04 | Disposition: A | Discharge status: Home / Self Care | Payer: BLUE CROSS/BLUE SHIELD | Source: Ambulatory Visit | Attending: Orthopedic Surgery | Admitting: Orthopedic Surgery

## 2017-03-04 DIAGNOSIS — Z0183 Encounter for blood typing: Secondary | ICD-10-CM | POA: Insufficient documentation

## 2017-03-04 DIAGNOSIS — Z01818 Encounter for other preprocedural examination: Secondary | ICD-10-CM | POA: Diagnosis not present

## 2017-03-04 DIAGNOSIS — Z01812 Encounter for preprocedural laboratory examination: Secondary | ICD-10-CM | POA: Diagnosis not present

## 2017-03-04 DIAGNOSIS — M1712 Unilateral primary osteoarthritis, left knee: Secondary | ICD-10-CM | POA: Insufficient documentation

## 2017-03-04 DIAGNOSIS — I10 Essential (primary) hypertension: Secondary | ICD-10-CM | POA: Insufficient documentation

## 2017-03-04 LAB — CBC
HEMATOCRIT: 38.4 % (ref 35.0–47.0)
Hemoglobin: 12.6 g/dL (ref 12.0–16.0)
MCH: 24.7 pg — ABNORMAL LOW (ref 26.0–34.0)
MCHC: 32.9 g/dL (ref 32.0–36.0)
MCV: 75.1 fL — ABNORMAL LOW (ref 80.0–100.0)
PLATELETS: 273 10*3/uL (ref 150–440)
RBC: 5.12 MIL/uL (ref 3.80–5.20)
RDW: 20.9 % — AB (ref 11.5–14.5)
WBC: 9.3 10*3/uL (ref 3.6–11.0)

## 2017-03-04 LAB — URINALYSIS, ROUTINE W REFLEX MICROSCOPIC
BACTERIA UA: NONE SEEN
Bilirubin Urine: NEGATIVE
Glucose, UA: NEGATIVE mg/dL
Hgb urine dipstick: NEGATIVE
KETONES UR: NEGATIVE mg/dL
Nitrite: NEGATIVE
PROTEIN: NEGATIVE mg/dL
Specific Gravity, Urine: 1.016 (ref 1.005–1.030)
pH: 6 (ref 5.0–8.0)

## 2017-03-04 LAB — PROTIME-INR
INR: 1.11
Prothrombin Time: 14.4 seconds (ref 11.4–15.2)

## 2017-03-04 LAB — BASIC METABOLIC PANEL
Anion gap: 9 (ref 5–15)
BUN: 12 mg/dL (ref 6–20)
CHLORIDE: 105 mmol/L (ref 101–111)
CO2: 26 mmol/L (ref 22–32)
Calcium: 9.6 mg/dL (ref 8.9–10.3)
Creatinine, Ser: 0.73 mg/dL (ref 0.44–1.00)
Glucose, Bld: 105 mg/dL — ABNORMAL HIGH (ref 65–99)
POTASSIUM: 3.9 mmol/L (ref 3.5–5.1)
SODIUM: 140 mmol/L (ref 135–145)

## 2017-03-04 LAB — TYPE AND SCREEN
ABO/RH(D): O POS
ANTIBODY SCREEN: NEGATIVE

## 2017-03-04 LAB — SEDIMENTATION RATE: Sed Rate: 9 mm/hr (ref 0–30)

## 2017-03-04 LAB — APTT: aPTT: 29 seconds (ref 24–36)

## 2017-03-04 LAB — SURGICAL PCR SCREEN
MRSA, PCR: NEGATIVE
STAPHYLOCOCCUS AUREUS: NEGATIVE

## 2017-03-04 NOTE — Patient Instructions (Signed)
Your procedure is scheduled on: Tuesday, March 16, 2017 Report to Same Day Surgery on the 2nd floor in the [place]. To find out your arrival time, please call [phone] between 1PM - 3PM on: Monday, July 16th  REMEMBER: Instructions that are not followed completely may result in serious medical risk up to and including death; or upon the discretion of your surgeon and anesthesiologist your surgery may need to be rescheduled.  Do not eat food or drink liquids after midnight. No gum chewing or hard candies  No Alcohol for 24 hours before or after surgery.  No Smoking for 24 hours prior to surgery.  Notify your doctor if there is any change in your medical condition (cold, fever, infection).  Do not wear jewelry, make-up, hairpins, clips or nail polish.  Do not wear lotions, powders, or perfumes.   Do not shave 48 hours prior to surgery.   Do not bring valuables to the hospital. [facility] is not responsible for any belongings or valuables.   TAKE THESE MEDICATIONS THE MORNING OF SURGERY WITH A SIP OF WATER:  1.  LISINOPRIL 2.  OMEPRAZOLE    Use CHG Soap or wipes as directed on instruction sheet.  Stop Anti-inflammatories such as Advil, Aleve, Ibuprofen, Motrin, Naproxen, Naprosyn, Goodie powder, or aspirin products. (May take Tylenol or Acetaminophen if needed.)  LAST DOSE ON Friday, July 6  Stop supplements until after surgery. (May continue Vitamin D, Vitamin B, and multivitamin.)  If you are being admitted to the hospital overnight, leave your suitcase in the car. After surgery it may be brought to your room.

## 2017-03-05 LAB — URINE CULTURE: Culture: <10000 — AB

## 2017-03-08 NOTE — Pre-Procedure Instructions (Signed)
UA CALLED AND FAXED TO [facility] AT [facility]

## 2017-03-15 MED ORDER — CEFAZOLIN SODIUM-DEXTROSE 2-4 GM/100ML-% IV SOLN
2.0000 g | Freq: Once | INTRAVENOUS | Status: AC
  Administered 2017-03-16: 2 g via INTRAVENOUS

## 2017-03-16 ENCOUNTER — Encounter
Admission: RE | Disposition: A | Discharge status: Home / Self Care | Payer: Self-pay | Source: Ambulatory Visit | Attending: Orthopedic Surgery

## 2017-03-16 ENCOUNTER — Inpatient Hospital Stay
Admission: RE | Admit: 2017-03-16 | Discharge: 2017-03-20 | DRG: 470 | Disposition: A | Discharge status: Home / Self Care | Payer: BLUE CROSS/BLUE SHIELD | Source: Ambulatory Visit | Attending: Orthopedic Surgery | Admitting: Orthopedic Surgery

## 2017-03-16 ENCOUNTER — Encounter: Payer: Self-pay | Admitting: *Deleted

## 2017-03-16 ENCOUNTER — Inpatient Hospital Stay: Payer: BLUE CROSS/BLUE SHIELD

## 2017-03-16 ENCOUNTER — Inpatient Hospital Stay: Payer: BLUE CROSS/BLUE SHIELD | Admitting: Anesthesiology

## 2017-03-16 DIAGNOSIS — F329 Major depressive disorder, single episode, unspecified: Secondary | ICD-10-CM | POA: Diagnosis present

## 2017-03-16 DIAGNOSIS — M25462 Effusion, left knee: Secondary | ICD-10-CM | POA: Diagnosis present

## 2017-03-16 DIAGNOSIS — Z79899 Other long term (current) drug therapy: Secondary | ICD-10-CM | POA: Diagnosis not present

## 2017-03-16 DIAGNOSIS — I10 Essential (primary) hypertension: Secondary | ICD-10-CM | POA: Diagnosis present

## 2017-03-16 DIAGNOSIS — I951 Orthostatic hypotension: Secondary | ICD-10-CM | POA: Diagnosis not present

## 2017-03-16 DIAGNOSIS — F419 Anxiety disorder, unspecified: Secondary | ICD-10-CM | POA: Diagnosis present

## 2017-03-16 DIAGNOSIS — M1712 Unilateral primary osteoarthritis, left knee: Principal | ICD-10-CM | POA: Diagnosis present

## 2017-03-16 DIAGNOSIS — Z87891 Personal history of nicotine dependence: Secondary | ICD-10-CM

## 2017-03-16 DIAGNOSIS — G8918 Other acute postprocedural pain: Secondary | ICD-10-CM

## 2017-03-16 DIAGNOSIS — Z7982 Long term (current) use of aspirin: Secondary | ICD-10-CM

## 2017-03-16 HISTORY — PX: TOTAL KNEE ARTHROPLASTY: SHX125

## 2017-03-16 LAB — CBC
HCT: 40 % (ref 35.0–47.0)
Hemoglobin: 13 g/dL (ref 12.0–16.0)
MCH: 25 pg — AB (ref 26.0–34.0)
MCHC: 32.5 g/dL (ref 32.0–36.0)
MCV: 76.9 fL — ABNORMAL LOW (ref 80.0–100.0)
PLATELETS: 238 10*3/uL (ref 150–440)
RBC: 5.2 MIL/uL (ref 3.80–5.20)
RDW: 19.7 % — ABNORMAL HIGH (ref 11.5–14.5)
WBC: 22.5 10*3/uL — ABNORMAL HIGH (ref 3.6–11.0)

## 2017-03-16 LAB — CREATININE, SERUM
CREATININE: 0.55 mg/dL (ref 0.44–1.00)
GFR calc Af Amer: >60 mL/min (ref >60)

## 2017-03-16 SURGERY — ARTHROPLASTY, KNEE, TOTAL
Anesthesia: Spinal | Laterality: Left | Wound class: Clean

## 2017-03-16 MED ORDER — PANTOPRAZOLE SODIUM 40 MG PO TBEC
40.0000 mg | DELAYED_RELEASE_TABLET | Freq: Every day | ORAL | Status: DC
  Administered 2017-03-17 – 2017-03-20 (×4): 40 mg via ORAL
  Filled 2017-03-16 (×4): qty 1

## 2017-03-16 MED ORDER — DIPHENHYDRAMINE HCL 12.5 MG/5ML PO ELIX: 12.5000 mg | ORAL_SOLUTION | ORAL | Status: DC | PRN

## 2017-03-16 MED ORDER — FENTANYL CITRATE (PF) 100 MCG/2ML IJ SOLN
INTRAMUSCULAR | Status: DC | PRN
  Administered 2017-03-16: 50 ug via INTRAVENOUS

## 2017-03-16 MED ORDER — PHENYLEPHRINE HCL 10 MG/ML IJ SOLN
INTRAMUSCULAR | Status: AC
  Filled 2017-03-16: qty 1

## 2017-03-16 MED ORDER — PROPOFOL 10 MG/ML IV BOLUS
INTRAVENOUS | Status: AC
  Filled 2017-03-16: qty 20

## 2017-03-16 MED ORDER — ONDANSETRON HCL 4 MG PO TABS: 4.0000 mg | ORAL_TABLET | Freq: Four times a day (QID) | ORAL | Status: DC | PRN

## 2017-03-16 MED ORDER — VASOPRESSIN 20 UNIT/ML IV SOLN
INTRAVENOUS | Status: DC | PRN
  Administered 2017-03-16: 2 [IU] via INTRAVENOUS

## 2017-03-16 MED ORDER — METHOCARBAMOL 500 MG PO TABS
500.0000 mg | ORAL_TABLET | Freq: Four times a day (QID) | ORAL | Status: DC | PRN
  Administered 2017-03-16 – 2017-03-18 (×4): 500 mg via ORAL
  Filled 2017-03-16 (×4): qty 1

## 2017-03-16 MED ORDER — MAGNESIUM HYDROXIDE 400 MG/5ML PO SUSP
30.0000 mL | Freq: Every day | ORAL | Status: DC | PRN
  Administered 2017-03-18: 30 mL via ORAL
  Filled 2017-03-16: qty 30

## 2017-03-16 MED ORDER — ASPIRIN EC 81 MG PO TBEC
162.5000 mg | DELAYED_RELEASE_TABLET | Freq: Every day | ORAL | Status: DC
  Administered 2017-03-16 – 2017-03-20 (×5): 162.5 mg via ORAL
  Filled 2017-03-16 (×5): qty 3

## 2017-03-16 MED ORDER — MORPHINE SULFATE 10 MG/ML IJ SOLN
INTRAMUSCULAR | Status: DC | PRN
  Administered 2017-03-16: 10 mg via INTRAVENOUS

## 2017-03-16 MED ORDER — LIDOCAINE HCL (PF) 2 % IJ SOLN
INTRAMUSCULAR | Status: AC
  Filled 2017-03-16: qty 2

## 2017-03-16 MED ORDER — ACETAMINOPHEN 650 MG RE SUPP: 650.0000 mg | Freq: Four times a day (QID) | RECTAL | Status: DC | PRN

## 2017-03-16 MED ORDER — LISINOPRIL 20 MG PO TABS
20.0000 mg | ORAL_TABLET | Freq: Every day | ORAL | Status: DC
  Administered 2017-03-17 – 2017-03-19 (×3): 20 mg via ORAL
  Filled 2017-03-16 (×3): qty 1

## 2017-03-16 MED ORDER — ONDANSETRON HCL 4 MG/2ML IJ SOLN: 4.0000 mg | Freq: Four times a day (QID) | INTRAMUSCULAR | Status: DC | PRN

## 2017-03-16 MED ORDER — OXYCODONE HCL 5 MG PO TABS
5.0000 mg | ORAL_TABLET | ORAL | Status: DC | PRN
  Administered 2017-03-16: 5 mg via ORAL
  Administered 2017-03-16 – 2017-03-18 (×8): 10 mg via ORAL
  Administered 2017-03-18: 5 mg via ORAL
  Administered 2017-03-18: 10 mg via ORAL
  Administered 2017-03-19: 5 mg via ORAL
  Administered 2017-03-19 (×2): 10 mg via ORAL
  Administered 2017-03-19 – 2017-03-20 (×3): 5 mg via ORAL
  Filled 2017-03-16 (×3): qty 2
  Filled 2017-03-16 (×4): qty 1
  Filled 2017-03-16 (×3): qty 2
  Filled 2017-03-16: qty 1
  Filled 2017-03-16: qty 2
  Filled 2017-03-16: qty 1
  Filled 2017-03-16 (×4): qty 2

## 2017-03-16 MED ORDER — TRANEXAMIC ACID 1000 MG/10ML IV SOLN
1000.0000 mg | INTRAVENOUS | Status: DC
  Filled 2017-03-16: qty 10

## 2017-03-16 MED ORDER — DEXAMETHASONE SODIUM PHOSPHATE 10 MG/ML IJ SOLN
INTRAMUSCULAR | Status: DC | PRN
  Administered 2017-03-16: 8 mg via INTRAVENOUS

## 2017-03-16 MED ORDER — METOCLOPRAMIDE HCL 5 MG/ML IJ SOLN
5.0000 mg | Freq: Three times a day (TID) | INTRAMUSCULAR | Status: DC | PRN
  Administered 2017-03-16: 10 mg via INTRAVENOUS
  Filled 2017-03-16: qty 2

## 2017-03-16 MED ORDER — ENOXAPARIN SODIUM 30 MG/0.3ML ~~LOC~~ SOLN
30.0000 mg | Freq: Two times a day (BID) | SUBCUTANEOUS | Status: DC
  Administered 2017-03-17 – 2017-03-20 (×7): 30 mg via SUBCUTANEOUS
  Filled 2017-03-16 (×7): qty 0.3

## 2017-03-16 MED ORDER — BUPIVACAINE LIPOSOME 1.3 % IJ SUSP
INTRAMUSCULAR | Status: DC | PRN
  Administered 2017-03-16: 60 mL

## 2017-03-16 MED ORDER — PROPOFOL 500 MG/50ML IV EMUL
INTRAVENOUS | Status: AC
  Filled 2017-03-16: qty 50

## 2017-03-16 MED ORDER — MIDAZOLAM HCL 5 MG/5ML IJ SOLN
INTRAMUSCULAR | Status: DC | PRN
  Administered 2017-03-16: 2 mg via INTRAVENOUS

## 2017-03-16 MED ORDER — ONDANSETRON HCL 4 MG/2ML IJ SOLN
INTRAMUSCULAR | Status: DC | PRN
  Administered 2017-03-16: 4 mg via INTRAVENOUS

## 2017-03-16 MED ORDER — CEFAZOLIN SODIUM-DEXTROSE 2-4 GM/100ML-% IV SOLN
INTRAVENOUS | Status: AC
  Filled 2017-03-16: qty 100

## 2017-03-16 MED ORDER — BISACODYL 10 MG RE SUPP
10.0000 mg | Freq: Every day | RECTAL | Status: DC | PRN
  Administered 2017-03-19: 10 mg via RECTAL
  Filled 2017-03-16: qty 1

## 2017-03-16 MED ORDER — LACTATED RINGERS IV SOLN
INTRAVENOUS | Status: DC
Start: 2017-03-16 — End: 2017-03-16
  Administered 2017-03-16 (×3): via INTRAVENOUS

## 2017-03-16 MED ORDER — FERROUS SULFATE 325 (65 FE) MG PO TABS
325.0000 mg | ORAL_TABLET | Freq: Two times a day (BID) | ORAL | Status: DC
  Administered 2017-03-16 – 2017-03-20 (×8): 325 mg via ORAL
  Filled 2017-03-16 (×8): qty 1

## 2017-03-16 MED ORDER — MORPHINE SULFATE (PF) 2 MG/ML IV SOLN
2.0000 mg | INTRAVENOUS | Status: DC | PRN
  Administered 2017-03-16 – 2017-03-17 (×3): 2 mg via INTRAVENOUS
  Filled 2017-03-16 (×3): qty 1

## 2017-03-16 MED ORDER — FENTANYL CITRATE (PF) 100 MCG/2ML IJ SOLN: 25.0000 ug | INTRAMUSCULAR | Status: DC | PRN

## 2017-03-16 MED ORDER — ACETAMINOPHEN 325 MG PO TABS
650.0000 mg | ORAL_TABLET | Freq: Four times a day (QID) | ORAL | Status: DC | PRN
  Administered 2017-03-16 – 2017-03-17 (×2): 650 mg via ORAL
  Filled 2017-03-16 (×2): qty 2

## 2017-03-16 MED ORDER — MIDAZOLAM HCL 2 MG/2ML IJ SOLN
INTRAMUSCULAR | Status: AC
  Filled 2017-03-16: qty 2

## 2017-03-16 MED ORDER — MAGNESIUM CITRATE PO SOLN
1.0000 | Freq: Once | ORAL | Status: DC | PRN
  Filled 2017-03-16: qty 296

## 2017-03-16 MED ORDER — PROPOFOL 500 MG/50ML IV EMUL
INTRAVENOUS | Status: DC | PRN
  Administered 2017-03-16: 75 ug/kg/min via INTRAVENOUS

## 2017-03-16 MED ORDER — SODIUM CHLORIDE 0.9 % IV SOLN
INTRAVENOUS | Status: DC | PRN
  Administered 2017-03-16: 25 ug/min via INTRAVENOUS

## 2017-03-16 MED ORDER — METOCLOPRAMIDE HCL 10 MG PO TABS: 5.0000 mg | ORAL_TABLET | Freq: Three times a day (TID) | ORAL | Status: DC | PRN

## 2017-03-16 MED ORDER — ZOLPIDEM TARTRATE 5 MG PO TABS
5.0000 mg | ORAL_TABLET | Freq: Every evening | ORAL | Status: DC | PRN
  Administered 2017-03-16: 5 mg via ORAL
  Filled 2017-03-16: qty 1

## 2017-03-16 MED ORDER — DEXAMETHASONE SODIUM PHOSPHATE 10 MG/ML IJ SOLN
INTRAMUSCULAR | Status: AC
  Filled 2017-03-16: qty 1

## 2017-03-16 MED ORDER — ONDANSETRON HCL 4 MG/2ML IJ SOLN: 4.0000 mg | Freq: Once | INTRAMUSCULAR | Status: DC | PRN

## 2017-03-16 MED ORDER — PHENOL 1.4 % MT LIQD
1.0000 | OROMUCOSAL | Status: DC | PRN
  Filled 2017-03-16: qty 177

## 2017-03-16 MED ORDER — CEFAZOLIN SODIUM-DEXTROSE 2-4 GM/100ML-% IV SOLN
2.0000 g | Freq: Four times a day (QID) | INTRAVENOUS | Status: AC
  Administered 2017-03-16 (×2): 2 g via INTRAVENOUS
  Filled 2017-03-16 (×3): qty 100

## 2017-03-16 MED ORDER — FENTANYL CITRATE (PF) 100 MCG/2ML IJ SOLN
INTRAMUSCULAR | Status: AC
  Filled 2017-03-16: qty 2

## 2017-03-16 MED ORDER — SERTRALINE HCL 50 MG PO TABS
50.0000 mg | ORAL_TABLET | Freq: Every day | ORAL | Status: DC
  Administered 2017-03-16 – 2017-03-19 (×4): 50 mg via ORAL
  Filled 2017-03-16 (×4): qty 1

## 2017-03-16 MED ORDER — ONDANSETRON HCL 4 MG/2ML IJ SOLN
INTRAMUSCULAR | Status: AC
  Filled 2017-03-16: qty 2

## 2017-03-16 MED ORDER — TRANEXAMIC ACID 1000 MG/10ML IV SOLN
INTRAVENOUS | Status: DC | PRN
  Administered 2017-03-16: 1000 mg via INTRAVENOUS

## 2017-03-16 MED ORDER — NEOMYCIN-POLYMYXIN B GU 40-200000 IR SOLN
Status: DC | PRN
  Administered 2017-03-16: 12 mL

## 2017-03-16 MED ORDER — MENTHOL 3 MG MT LOZG
1.0000 | LOZENGE | OROMUCOSAL | Status: DC | PRN
  Filled 2017-03-16: qty 9

## 2017-03-16 MED ORDER — DOCUSATE SODIUM 100 MG PO CAPS
100.0000 mg | ORAL_CAPSULE | Freq: Two times a day (BID) | ORAL | Status: DC
  Administered 2017-03-16 – 2017-03-19 (×7): 100 mg via ORAL
  Filled 2017-03-16 (×7): qty 1

## 2017-03-16 MED ORDER — PHENYLEPHRINE HCL 10 MG/ML IJ SOLN
INTRAMUSCULAR | Status: DC | PRN
  Administered 2017-03-16: 80 ug via INTRAVENOUS

## 2017-03-16 MED ORDER — PROPOFOL 10 MG/ML IV BOLUS
INTRAVENOUS | Status: DC | PRN
  Administered 2017-03-16 (×4): 10 mg via INTRAVENOUS

## 2017-03-16 MED ORDER — SODIUM CHLORIDE 0.9 % IV SOLN
INTRAVENOUS | Status: DC
  Administered 2017-03-16: 20:00:00 via INTRAVENOUS

## 2017-03-16 MED ORDER — LIDOCAINE HCL (CARDIAC) 20 MG/ML IV SOLN
INTRAVENOUS | Status: DC | PRN
  Administered 2017-03-16: 60 mg via INTRAVENOUS

## 2017-03-16 MED ORDER — BUPIVACAINE-EPINEPHRINE (PF) 0.25% -1:200000 IJ SOLN
INTRAMUSCULAR | Status: DC | PRN
  Administered 2017-03-16: 30 mL

## 2017-03-16 MED ORDER — METHOCARBAMOL 1000 MG/10ML IJ SOLN
500.0000 mg | Freq: Four times a day (QID) | INTRAVENOUS | Status: DC | PRN
  Filled 2017-03-16: qty 5

## 2017-03-16 SURGICAL SUPPLY — 55 items
BANDAGE ACE 6X5 VEL STRL LF (GAUZE/BANDAGES/DRESSINGS) ×3 IMPLANT
BLADE SAW 1 (BLADE) ×3 IMPLANT
BLADE SAW 1/2 (BLADE) ×3 IMPLANT
CANISTER SUCT 1200ML W/VALVE (MISCELLANEOUS) ×3 IMPLANT
CANISTER SUCT 3000ML PPV (MISCELLANEOUS) ×6 IMPLANT
CAPT KNEE TOTAL 3 ×3 IMPLANT
CATH FOL LEG HOLDER (MISCELLANEOUS) ×3 IMPLANT
CATH TRAY METER 16FR LF (MISCELLANEOUS) ×3 IMPLANT
CEMENT HV SMART SET (Cement) ×6 IMPLANT
CHLORAPREP W/TINT 26ML (MISCELLANEOUS) ×6 IMPLANT
COOLER POLAR GLACIER W/PUMP (MISCELLANEOUS) ×3 IMPLANT
CUFF TOURN 24 STER (MISCELLANEOUS) ×3 IMPLANT
DRAPE SHEET LG 3/4 BI-LAMINATE (DRAPES) ×6 IMPLANT
ELECT CAUTERY BLADE 6.4 (BLADE) ×3 IMPLANT
ELECT REM PT RETURN 9FT ADLT (ELECTROSURGICAL) ×3
ELECTRODE REM PT RTRN 9FT ADLT (ELECTROSURGICAL) ×1 IMPLANT
GAUZE PETRO XEROFOAM 1X8 (MISCELLANEOUS) ×3 IMPLANT
GAUZE SPONGE 4X4 12PLY STRL (GAUZE/BANDAGES/DRESSINGS) ×3 IMPLANT
GLOVE BIOGEL PI IND STRL 9 (GLOVE) ×1 IMPLANT
GLOVE BIOGEL PI INDICATOR 9 (GLOVE) ×2
GLOVE INDICATOR 8.0 STRL GRN (GLOVE) ×3 IMPLANT
GLOVE SURG ORTHO 8.0 STRL STRW (GLOVE) ×3 IMPLANT
GLOVE SURG SYN 9.0  PF PI (GLOVE) ×2
GLOVE SURG SYN 9.0 PF PI (GLOVE) ×1 IMPLANT
GOWN SRG 2XL LVL 4 RGLN SLV (GOWNS) ×1 IMPLANT
GOWN STRL NON-REIN 2XL LVL4 (GOWNS) ×2
GOWN STRL REUS W/ TWL LRG LVL3 (GOWN DISPOSABLE) ×1 IMPLANT
GOWN STRL REUS W/ TWL XL LVL3 (GOWN DISPOSABLE) ×1 IMPLANT
GOWN STRL REUS W/TWL LRG LVL3 (GOWN DISPOSABLE) ×2
GOWN STRL REUS W/TWL XL LVL3 (GOWN DISPOSABLE) ×2
HOOD PEEL AWAY FLYTE STAYCOOL (MISCELLANEOUS) ×6 IMPLANT
IMMBOLIZER KNEE 19 BLUE UNIV (SOFTGOODS) ×3 IMPLANT
KIT RM TURNOVER STRD PROC AR (KITS) ×3 IMPLANT
KNIFE SCULPS 14X20 (INSTRUMENTS) ×3 IMPLANT
NDL SAFETY 18GX1.5 (NEEDLE) ×3 IMPLANT
NEEDLE SPNL 18GX3.5 QUINCKE PK (NEEDLE) ×3 IMPLANT
NEEDLE SPNL 20GX3.5 QUINCKE YW (NEEDLE) ×3 IMPLANT
NS IRRIG 1000ML POUR BTL (IV SOLUTION) ×3 IMPLANT
PACK TOTAL KNEE (MISCELLANEOUS) ×3 IMPLANT
PAD WRAPON POLAR KNEE (MISCELLANEOUS) ×1 IMPLANT
PULSAVAC PLUS IRRIG FAN TIP (DISPOSABLE) ×3
SOL .9 NS 3000ML IRR  AL (IV SOLUTION) ×2
SOL .9 NS 3000ML IRR UROMATIC (IV SOLUTION) ×1 IMPLANT
STAPLER SKIN PROX 35W (STAPLE) ×3 IMPLANT
SUCTION FRAZIER HANDLE 10FR (MISCELLANEOUS) ×2
SUCTION TUBE FRAZIER 10FR DISP (MISCELLANEOUS) ×1 IMPLANT
SUT DVC 2 QUILL PDO  T11 36X36 (SUTURE) ×2
SUT DVC 2 QUILL PDO T11 36X36 (SUTURE) ×1 IMPLANT
SUT V-LOC 90 ABS DVC 3-0 CL (SUTURE) ×3 IMPLANT
SYR 20CC LL (SYRINGE) ×3 IMPLANT
SYR 50ML LL SCALE MARK (SYRINGE) ×6 IMPLANT
TIP FAN IRRIG PULSAVAC PLUS (DISPOSABLE) ×1 IMPLANT
TOWEL OR 17X26 4PK STRL BLUE (TOWEL DISPOSABLE) ×3 IMPLANT
TOWER CARTRIDGE SMART MIX (DISPOSABLE) ×3 IMPLANT
WRAPON POLAR PAD KNEE (MISCELLANEOUS) ×3

## 2017-03-16 NOTE — Anesthesia Preprocedure Evaluation (Signed)
Anesthesia Evaluation  Patient identified by MRN, date of birth, ID band Patient awake    Reviewed: Allergy & Precautions, NPO status , Patient's Chart, lab work & pertinent test results  History of Anesthesia Complications Negative for: history of anesthetic complications  Airway Mallampati: III       Dental   Pulmonary neg pulmonary ROS, former smoker,           Cardiovascular hypertension, Pt. on medications      Neuro/Psych Anxiety Depression negative neurological ROS     GI/Hepatic Neg liver ROS, GERD  Medicated and Controlled,  Endo/Other  negative endocrine ROS  Renal/GU negative Renal ROS     Musculoskeletal   Abdominal   Peds  Hematology  (+) anemia ,   Anesthesia Other Findings   Reproductive/Obstetrics                            Anesthesia Physical Anesthesia Plan  ASA: II  Anesthesia Plan: Spinal   Post-op Pain Management:    Induction:   PONV Risk Score and Plan:   Airway Management Planned:   Additional Equipment:   Intra-op Plan:   Post-operative Plan:   Informed Consent: I have reviewed the patients History and Physical, chart, labs and discussed the procedure including the risks, benefits and alternatives for the proposed anesthesia with the patient or authorized representative who has indicated his/her understanding and acceptance.     Plan Discussed with:   Anesthesia Plan Comments:         Anesthesia Quick Evaluation

## 2017-03-16 NOTE — OR Nursing (Signed)
Interpreter Ronnald Collum present for preop admission with Lorie Apley, RN and anesthesia Dr. Ronelle Nigh.  Dr. Rudene Christians advised when in to see pt @ 1008 am that he did not need the interpreter.

## 2017-03-16 NOTE — Anesthesia Post-op Follow-up Note (Cosign Needed)
Anesthesia QCDR form completed.        

## 2017-03-16 NOTE — OR Nursing (Signed)
Interpreter Ronnald Collum with pt, OR nurse & CRNA to OR

## 2017-03-16 NOTE — OR Nursing (Signed)
Discussed NO BLOOD PRODUCTS with B Morgan, RN; blood band placed in chart and not on patient.

## 2017-03-16 NOTE — Transfer of Care (Signed)
Immediate Anesthesia Transfer of Care Note  Patient: Caitlyn [name]  Procedure(s) Performed: Procedure(s): TOTAL KNEE ARTHROPLASTY (Left)  Patient Location: PACU  Anesthesia Type:Spinal  Level of Consciousness: awake  Airway & Oxygen Therapy: Patient Spontanous Breathing and Patient connected to nasal cannula oxygen  Post-op Assessment: Report given to RN and Post -op Vital signs reviewed and stable  Post vital signs: Reviewed and stable  Last Vitals:  Vitals:   03/16/17 0816  BP: (!) 142/84  Pulse: 76  Resp: 16  Temp: 36.9 C    Last Pain:  Vitals:   03/16/17 0816  TempSrc: Oral  PainSc: 8          Complications: No apparent anesthesia complications

## 2017-03-16 NOTE — Op Note (Signed)
03/16/2017  1:08 PM  PATIENT:  Graciella Torres-Tirado  51 y.o. female  PRE-OPERATIVE DIAGNOSIS:  primary osteoarthritis of left knee  POST-OPERATIVE DIAGNOSIS:  primary osteoarthritis of left knee  PROCEDURE:  Procedure(s): TOTAL KNEE ARTHROPLASTY (Left)  SURGEON: Laurene Footman, MD  ASSISTANTS: None  ANESTHESIA:   spinal  EBL:  Total I/O In: 1100 [I.V.:1000; IV Piggyback:100] Out: -   BLOOD ADMINISTERED:none  DRAINS: none   LOCAL MEDICATIONS USED:  MARCAINE    and OTHER exparel and morphine  SPECIMEN:  No Specimen  DISPOSITION OF SPECIMEN:  N/A  COUNTS:  YES  TOURNIQUET:   62 minutes at 300 mmHg  IMPLANTS: Medacta GMK sphere size 2 left femur to tibia with 12 mm insert and short stem, to patella all components cemented  DICTATION: .Dragon Dictation patient brought the operating room and after adequate anesthesia was obtained the left leg was prepped and draped in sterile fashion. After patient identification and timeout procedures were completed, midline skin incision was made followed by medial parapatellar arthrotomy. The fat pad and anterior cruciate ligament were excised. The patellofemoral joint had significant wear and there is exposed bone in the medial compartment with relative sparing of the lateral compartment. The proximal tibia was exposed and the action medullary tibial alignment guide was utilized with proximal tibia resection and carried out. Distal femoral resection was carried out using an IM rod as alignment guide after resection there was adequate resection with the spacer blocks. The femur was then cut with the 4-in-1 cutting block using posterior referencing size 2 anterior posterior and chamfer cuts made with no notching. Trials were placed first visit with tibial keel preparation with drilling and keel punch followed by the femoral component trial and a 12 mm insert gave excellent stability throughout a range of motion. Patella was cut using the patellar  cutting guide and sized to size 2 after drilling holes were made this point all trials were removed and the trochlear groove cut made for the femur. The local anesthetic noted above was infiltrated with tourniquet down and hemostasis checked electrocautery the tourniquet was raised after the tibial cut as there is significant bony bleeding. The knee was thoroughly irrigated and dried. The tibial components cemented in place first followed by the polyethylene component with screw tightened with the torque screwdriver. The femoral component was placed with the knee held in extension. Patellar button was clamped with cement and after the cemented set eczema excess cement was removed. Small lateral release was required to get good patellofemoral tracking. Tourniquet was let down at this point and there is no significant bleeding. 0 Ethibond was used to repair the medial retinaculum followed by a heavy Quill for the capsule.3-0 v-loc was used subcutaneously followed by skin staples. Xeroform 4 x 4's ABDs and web roll Polar Care Ace wrap applied  PLAN OF CARE: Admit to inpatient   PATIENT DISPOSITION:  PACU - hemodynamically stable.

## 2017-03-16 NOTE — Anesthesia Procedure Notes (Signed)
Date/Time: 03/16/2017 10:45 AM Performed by: Allean Found Pre-anesthesia Checklist: Patient identified, Emergency Drugs available, Suction available, Patient being monitored and Timeout performed Patient Re-evaluated:Patient Re-evaluated prior to induction Oxygen Delivery Method: Nasal cannula Placement Confirmation: positive ETCO2

## 2017-03-16 NOTE — H&P (Signed)
Reviewed paper H+P, will be scanned into chart. No changes noted.  

## 2017-03-16 NOTE — Anesthesia Procedure Notes (Signed)
Spinal  Start time: 03/16/2017 10:40 AM End time: 03/16/2017 10:45 AM Staffing Anesthesiologist: Gunnar Fusi Resident/CRNA: Allean Found Performed: resident/CRNA  Preanesthetic Checklist Completed: patient identified, site marked, surgical consent, pre-op evaluation, timeout performed, IV checked, risks and benefits discussed and monitors and equipment checked Spinal Block Patient position: sitting Prep: Betadine Patient monitoring: heart rate, continuous pulse ox and blood pressure Approach: midline Location: L3-4 Injection technique: single-shot Needle Needle type: Pencan  Needle gauge: 24 G Needle length: 10 cm Needle insertion depth: 8.5 cm Catheter type: closed end flexible Assessment Sensory level: T4

## 2017-03-16 NOTE — OR Nursing (Signed)
TXA AND ANCEF 2 GM ON CHART FOR OR. SACRAL DRESSING TO OR WITH PATIENT TO OR WITH WARMING HAT IN PLACE.

## 2017-03-16 NOTE — NC FL2 (Signed)
[place] LEVEL OF CARE SCREENING TOOL     IDENTIFICATION  Patient Name: Caitlyn [name] Birthdate: 1966-02-14 Sex: female Admission Date (Current Location): 03/16/2017  [place] and [place] Number:  Engineering geologist and Address:  [facility], [address], [place], [place] [address]      Provider Number: [id number]  Attending Physician Name and Address:  Hessie Knows, MD  Relative Name and Phone Number:       Current Level of Care: Hospital Recommended Level of Care: [place] Prior Approval Number:    Date Approved/Denied:   PASRR Number:  [id number])  Discharge Plan: SNF    Current Diagnoses: Patient Active Problem List   Diagnosis Date Noted  . Primary localized osteoarthritis of left knee 03/16/2017  . Surgical menopause, symptomatic 01/26/2017  . Status post abdominal hysterectomy and left salpingo-oophorectomy 01/26/2017  . S/P TAH (total abdominal hysterectomy) 01/20/2017  . 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 Primary localized osteoarthritis of right knee 07/07/2016  . Abnormal hepatitis serology 04/10/2016  . Polyarthralgia 04/03/2016  . Anemia 10/23/2015    Orientation RESPIRATION BLADDER Height & Weight     Self, Time, Situation, Place  Normal Continent Weight: 179 lb (81.2 kg) Height:  5\' 4"  (162.6 cm)  BEHAVIORAL SYMPTOMS/MOOD NEUROLOGICAL BOWEL NUTRITION STATUS   (none)  (none) Continent Diet (Regular Diet )  AMBULATORY STATUS COMMUNICATION OF NEEDS Skin   Extensive Assist Verbally Surgical wounds (Incision: Left Knee )                       Personal Care Assistance Level of Assistance  Bathing, Feeding, Dressing Bathing Assistance: Limited assistance Feeding assistance: Independent Dressing Assistance: Limited assistance     Functional Limitations Info  Sight, Hearing, Speech Sight Info: Adequate Hearing Info: Adequate Speech Info: Adequate    SPECIAL CARE FACTORS  FREQUENCY  PT (By licensed PT), OT (By licensed OT)     PT Frequency:  (5) OT Frequency:  (5)            Contractures      Additional Factors Info  Code Status, Allergies Code Status Info:  (Full Code. ) Allergies Info:  (No Known Allergies. )           Current Medications (03/16/2017):  This is the current hospital active medication list Current Facility-Administered Medications  Medication Dose Route Frequency Provider Last Rate Last Dose  . 0.9 %  sodium chloride infusion   Intravenous Continuous Hessie Knows, MD      . acetaminophen (TYLENOL) tablet 650 mg  650 mg Oral Q6H PRN Hessie Knows, MD       Or  . acetaminophen (TYLENOL) suppository 650 mg  650 mg Rectal Q6H PRN Hessie Knows, MD      . aspirin EC tablet 162.5 mg  162.5 mg Oral Daily Hessie Knows, MD      . bisacodyl (DULCOLAX) suppository 10 mg  10 mg Rectal Daily PRN Hessie Knows, MD      . ceFAZolin (ANCEF) IVPB 2g/100 mL premix  2 g Intravenous Q6H Hessie Knows, MD      . diphenhydrAMINE (BENADRYL) 12.5 MG/5ML elixir 12.5-25 mg  12.5-25 mg Oral Q4H PRN Hessie Knows, MD      . docusate sodium (COLACE) capsule 100 mg  100 mg Oral BID Hessie Knows, MD      . Derrill Memo ON 03/17/2017] enoxaparin (LOVENOX) injection 30 mg  30 mg Subcutaneous Q12H Hessie Knows, MD      .  ferrous sulfate tablet 325 mg  325 mg Oral BID WC Hessie Knows, MD      . Derrill Memo ON 03/17/2017] lisinopril (PRINIVIL,ZESTRIL) tablet 20 mg  20 mg Oral Q breakfast Hessie Knows, MD      . magnesium citrate solution 1 Bottle  1 Bottle Oral Once PRN Hessie Knows, MD      . magnesium hydroxide (MILK OF MAGNESIA) suspension 30 mL  30 mL Oral Daily PRN Hessie Knows, MD      . menthol-cetylpyridinium (CEPACOL) lozenge 3 mg  1 lozenge Oral PRN Hessie Knows, MD       Or  . phenol (CHLORASEPTIC) mouth spray 1 spray  1 spray Mouth/Throat PRN Hessie Knows, MD      . methocarbamol (ROBAXIN) tablet 500 mg  500 mg Oral Q6H PRN Hessie Knows, MD       Or   . methocarbamol (ROBAXIN) 500 mg in dextrose 5 % 50 mL IVPB  500 mg Intravenous Q6H PRN Hessie Knows, MD      . metoCLOPramide (REGLAN) tablet 5-10 mg  5-10 mg Oral Q8H PRN Hessie Knows, MD       Or  . metoCLOPramide (REGLAN) injection 5-10 mg  5-10 mg Intravenous Q8H PRN Hessie Knows, MD      . morphine 2 MG/ML injection 2 mg  2 mg Intravenous Q2H PRN Hessie Knows, MD      . ondansetron [facility]) tablet 4 mg  4 mg Oral Q6H PRN Hessie Knows, MD       Or  . ondansetron [facility]) injection 4 mg  4 mg Intravenous Q6H PRN Hessie Knows, MD      . oxyCODONE (Oxy IR/ROXICODONE) immediate release tablet 5-10 mg  5-10 mg Oral Q3H PRN Hessie Knows, MD      . Derrill Memo ON 03/17/2017] pantoprazole (PROTONIX) EC tablet 40 mg  40 mg Oral Daily Hessie Knows, MD      . sertraline (ZOLOFT) tablet 50 mg  50 mg Oral q1800 Hessie Knows, MD      . zolpidem (AMBIEN) tablet 5 mg  5 mg Oral QHS PRN Hessie Knows, MD         Discharge Medications: Please see discharge summary for a list of discharge medications.  Relevant Imaging Results:  Relevant Lab Results:   Additional Information  (SSN: [id number])  Mariem Skolnick, Veronia Beets, LCSW

## 2017-03-17 ENCOUNTER — Encounter: Payer: Self-pay | Admitting: Orthopedic Surgery

## 2017-03-17 LAB — BASIC METABOLIC PANEL
ANION GAP: 6 (ref 5–15)
BUN: 18 mg/dL (ref 6–20)
CALCIUM: 8.9 mg/dL (ref 8.9–10.3)
CO2: 28 mmol/L (ref 22–32)
Chloride: 105 mmol/L (ref 101–111)
Creatinine, Ser: 0.7 mg/dL (ref 0.44–1.00)
Glucose, Bld: 187 mg/dL — ABNORMAL HIGH (ref 65–99)
POTASSIUM: 4.1 mmol/L (ref 3.5–5.1)
SODIUM: 139 mmol/L (ref 135–145)

## 2017-03-17 LAB — CBC
HCT: 33.4 % — ABNORMAL LOW (ref 35.0–47.0)
Hemoglobin: 10.9 g/dL — ABNORMAL LOW (ref 12.0–16.0)
MCH: 24.8 pg — ABNORMAL LOW (ref 26.0–34.0)
MCHC: 32.6 g/dL (ref 32.0–36.0)
MCV: 75.9 fL — ABNORMAL LOW (ref 80.0–100.0)
PLATELETS: 227 10*3/uL (ref 150–440)
RBC: 4.4 MIL/uL (ref 3.80–5.20)
RDW: 19.4 % — AB (ref 11.5–14.5)
WBC: 15 10*3/uL — AB (ref 3.6–11.0)

## 2017-03-17 NOTE — Plan of Care (Signed)
Problem: Pain Managment: Goal: General experience of comfort will improve Outcome: Not Progressing Pt continues to rate her pain 10/10

## 2017-03-17 NOTE — Anesthesia Postprocedure Evaluation (Signed)
Anesthesia Post Note  Patient: Caitlyn [name]  Procedure(s) Performed: Procedure(s) (LRB): TOTAL KNEE ARTHROPLASTY (Left)  Patient location during evaluation: Nursing Unit Anesthesia Type: Spinal Level of consciousness: awake and alert and oriented Pain management: satisfactory to patient Vital Signs Assessment: post-procedure vital signs reviewed and stable Respiratory status: respiratory function stable Cardiovascular status: stable Postop Assessment: no headache, no backache, patient able to bend at knees, no signs of nausea or vomiting, adequate PO intake and spinal receding Anesthetic complications: no     Last Vitals:  Vitals:   03/16/17 2316 03/17/17 0703  BP: 133/79 (!) 156/130  Pulse: 63 63  Resp: 19   Temp: 37.4 C 37.2 C    Last Pain:  Vitals:   03/17/17 0703  TempSrc: Oral  PainSc:                  Blima Singer

## 2017-03-17 NOTE — Evaluation (Signed)
Occupational Therapy Evaluation Patient Details Name: Caitlyn [name] MRN: [id number] DOB: [birth year] Today's Date: 03/17/2017    History of Present Illness Pt is a spanish speaking 51 y.o. F s/p L TKA. 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   Clinical Impression   Pt seen for OT evaluation this date. Pt is 51 year old female s/p L TKR.  Pt was independent in all ADLs prior to surgery and is eager to return to PLOF. Pt using SPC prior to surgery due to knee pain, however this caused elbow pain. (No reported elbow pain during session.) Pt currently requires minimal assist for LB dressing while in seated position due to pain and limited AROM of L knee. Granddaughter and spouse able to provide necessary level of assist per pt report. Pt would benefit from instruction in dressing techniques with or without assistive devices for dressing and bathing skills as well as recommendations for home/routines modifications to increase safety in the bathroom and kitchen in order to prevent falls. Recommend HHOT services following hospitalization. Pt would benefit from a 3:1 to support toileting and bathing.      Follow Up Recommendations  Home health OT    Equipment Recommendations  3 in 1 bedside commode    Recommendations for Other Services       Precautions / Restrictions Precautions Precautions: Knee;Fall Precaution Booklet Issued: No Required Braces or Orthoses: Knee Immobilizer - Left Restrictions Weight Bearing Restrictions: Yes LLE Weight Bearing: Weight bearing as tolerated      Mobility Bed Mobility               General bed mobility comments: deferred d/t up in recliner   Transfers Overall transfer level: Needs assistance Equipment used: Rolling walker (2 wheeled) Transfers: Sit to/from Stand Sit to Stand: Min guard         General transfer comment: good safety awareness and hand placement with no LOB    Balance Overall balance  assessment: Needs assistance Sitting-balance support: Single extremity supported;Feet supported Sitting balance-Leahy Scale: Good     Standing balance support: Bilateral upper extremity supported Standing balance-Leahy Scale: Good                             ADL either performed or assessed with clinical judgement   ADL Overall ADL's : Needs assistance/impaired                                     Functional mobility during ADLs: Min guard;Rolling walker General ADL Comments: pt generally min assist for LB ADL, granddaughter/spouse able to provide level of assist, min guard with good safety awareness during functional mobility; pt educated in environmental considerations and functional mobility with RW and AE for RW to improve access to items throughout her day     Vision Baseline Vision/History: Wears glasses Wears Glasses: At all times Patient Visual Report: No change from baseline Vision Assessment?: No apparent visual deficits     Perception     Praxis      Pertinent Vitals/Pain Pain Assessment: 0-10 Pain Score: 10-Worst pain ever Pain Location: 10/10 at start of session after pain meds given; decreasing to 8/10 after mobility Pain Descriptors / Indicators: Tightness;Tender;Aching Pain Intervention(s): Limited activity within patient's tolerance;Monitored during session;Premedicated before session;Repositioned;Ice applied     Hand Dominance Right   Extremity/Trunk Assessment Upper Extremity Assessment Upper  Extremity Assessment: Overall WFL for tasks assessed (pt reported pain in both elbows due to using SPC at home; no pain during session)   Lower Extremity Assessment Lower Extremity Assessment: Defer to PT evaluation;LLE deficits/detail LLE Deficits / Details: general L LE weakness noted during assessment; unable to fully assess due to pain   Cervical / Trunk Assessment Cervical / Trunk Assessment: Normal   Communication  Communication Communication: Prefers language other than English;Interpreter utilized (Spanish speaking)   Cognition Arousal/Alertness: Awake/alert Behavior During Therapy: WFL for tasks assessed/performed Overall Cognitive Status: Within Functional Limits for tasks assessed                                     General Comments       Exercises Other Exercises Other Exercises: pt educated in polar care mgt and home/routines modifications to maximize safety and functional independence   Shoulder Instructions      Home Living Family/patient expects to be discharged to:: Private residence Living Arrangements: Spouse/significant other Available Help at Discharge: Family;Available 24 hours/day Type of Home: Apartment Home Access: Stairs to enter [employer] of Steps: 4+13 Entrance Stairs-Rails: Left Home Layout: One level     Bathroom Shower/Tub: Teacher, early years/pre: Standard     Home Equipment: Environmental consultant - 2 wheels;Cane - single point   Additional Comments: keeps table next to toilet and uses it like a grab bar      Prior Functioning/Environment Level of Independence: Independent with assistive device(s)        Comments: pt reports use of SPC and RW as needed for ambulation        OT Problem List: Decreased strength;Pain;Decreased range of motion;Decreased knowledge of use of DME or AE      OT Treatment/Interventions: Self-care/ADL training;Therapeutic exercise;Therapeutic activities;Energy conservation;DME and/or AE instruction;Patient/family education    OT Goals(Current goals can be found in the care plan section) Acute Rehab OT Goals Patient Stated Goal: to decrease pain and return home OT Goal Formulation: With patient/family Time For Goal Achievement: 03/31/17 Potential to Achieve Goals: Good  OT Frequency: Min 1X/week   Barriers to D/C:            Co-evaluation              AM-PAC PT "6 Clicks" Daily  Activity     Outcome Measure Help from another person eating meals?: None Help from another person taking care of personal grooming?: None Help from another person toileting, which includes using toliet, bedpan, or urinal?: A Little Help from another person bathing (including washing, rinsing, drying)?: A Little Help from another person to put on and taking off regular upper body clothing?: None Help from another person to put on and taking off regular lower body clothing?: A Little 6 Click Score: 21   End of Session Equipment Utilized During Treatment: Gait belt;Rolling walker;Left knee immobilizer  Activity Tolerance: Patient tolerated treatment well Patient left: in chair;with call bell/phone within reach;with chair alarm set;with family/visitor present;with SCD's reapplied;Other (comment) (polar care in place)  OT Visit Diagnosis: Other abnormalities of gait and mobility (R26.89);Pain Pain - Right/Left: Left Pain - part of body: Knee                Time: 5366-4403 OT Time Calculation (min): 38 min Charges:  OT General Charges $OT Visit: 1 Procedure OT Evaluation $OT Eval Low Complexity: 1 Procedure OT Treatments $Self  Care/Home Management : 8-22 mins $Therapeutic Activity: 8-22 mins G-Codes:     Jeni Salles, MPH, MS, OTR/L ascom [phone] 03/17/17, 4:01 PM

## 2017-03-17 NOTE — Care Management Note (Addendum)
Case Management Note  Patient Details  Name: Caitlyn [name] MRN: [id number] Date of Birth: November 14, 1965  Subjective/Objective: RNCM consult for discharge planning.  POD # 1 left total knee arthroplasty. Met with patient and interpreter at bedside. Patient lives with her spouse. Will need a BSC. Ordered from [facility]. Offered choice of home health agencies. Referral to [facility] for HHPT/HHOT. [address][phone]. Called Lovenox 40 mg # 14 no refills. PCP is Dr. Alene Mires.                        Action/Plan: [facility] for HHPT, Lovenox called in, [facility] ordered from [facility].   Expected Discharge Date:                  Expected Discharge Plan:  [place]  In-House Referral:     Discharge planning Services  CM Consult  Post Acute Care Choice:  Durable Medical Equipment, Home Health Choice offered to:  Patient  DME Arranged:  Eelevated commode seat DME Agency:  [place] Arranged:  PT, OT [place] Agency:  [facility]  Status of Service:  In process, will continue to follow  If discussed at Long Length of Stay Meetings, dates discussed:    Additional Comments:  Jolly Mango, RN 03/17/2017, 1:52 PM

## 2017-03-17 NOTE — Evaluation (Signed)
Physical Therapy Evaluation Patient Details Name: Caitlyn [name] MRN: [id number] DOB: [birth year] Today's Date: 03/17/2017   History of Present Illness  Pt is a spanish speaking 51 y.o. F s/p L TKA. 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  Clinical Impression  Spanish interpreter utilized throughout entire session. Pt reports use of SPC and RW prior to admission for ambulation and has 17 stairs to enter her apartment. During evaluation pt required CGA and RW for safety for sit-to/from-stand (bed elevated significantly for sit-to-stand per pt request due to L knee pain) and stand-pivot transfer. Pt initially reports 10/10 L knee pain (premedicated prior to session) reduced to 8/10 L knee pain at end of session. Pt requires vc's for B LE placement during sit-to/from-stand; pt also received vc's for B LE placement and B UE placement for weight distribution during stand-pivot transfer. Pt will benefit from skilled PT to address L knee pain, ROM, strength, balance, and functional mobility deficits (see below). Recommended discharge to home with HHPT.    Follow Up Recommendations Home health PT    Equipment Recommendations  Rolling walker with 5" wheels    Recommendations for Other Services       Precautions / Restrictions Precautions Precautions: Knee;Fall Precaution Booklet Issued: Yes (comment) Required Braces or Orthoses: Knee Immobilizer - Left (unable to perform SLR without assistance) Restrictions Weight Bearing Restrictions: Yes LLE Weight Bearing: Weight bearing as tolerated      Mobility  Bed Mobility Overal bed mobility: Needs Assistance Bed Mobility: Supine to Sit     Supine to sit: Min assist;HOB elevated     General bed mobility comments: pt requires min assist with L LE for supine-to-sit with HOB slightly elevated with increased time/effort to perform  Transfers Overall transfer level: Needs assistance Equipment used: Rolling walker (2  wheeled) Transfers: Sit to/from [facility] Sit to Stand: Min guard Stand pivot transfers: Min guard       General transfer comment: pt requires CGA and RW for sit-to-stand (bed elevated significantly per pt request due to L knee pain) and stand-pivot transfer from EOB to bedside chair; pt received vc's for foot placement with sit-to/from-stand to keep L knee out and R foot under her for proper weight distribution with L knee immobilizer in place; she also received vc's for B UE weight distribution and pivoting on R LE for stand-pivot transfer  Ambulation/Gait             General Gait Details: deferred due to reported 10/10 L knee pain (will retry this afternoon)  Stairs            Wheelchair Mobility    Modified Rankin (Stroke Patients Only)       Balance Overall balance assessment: Needs assistance Sitting-balance support: Single extremity supported;Feet supported Sitting balance-Leahy Scale: Good Sitting balance - Comments: static sitting balance with no overt loss of balance noted   Standing balance support: Bilateral upper extremity supported Standing balance-Leahy Scale: Good Standing balance comment: static standing balance and transfer with CGA and RW for safety; no overt loss of balance noted                             Pertinent Vitals/Pain Pain Assessment: 0-10 Pain Score: 10-Worst pain ever Pain Location: L knee Pain Descriptors / Indicators: Tightness;Tender;Aching Pain Intervention(s): Limited activity within patient's tolerance;Monitored during session;Premedicated before session;Repositioned;Ice applied  HR - 81 bpm end of session via pulse ox O2 -  97% on RA end of session via pulse ox    Home Living Family/patient expects to be discharged to:: Private residence Living Arrangements: Spouse/significant other Available Help at Discharge: Family;Available 24 hours/day Type of Home: Apartment Home Access: Stairs to  enter Entrance Stairs-Rails: Left Entrance Stairs-Number of Steps:  (4 + 13) Home Layout: One level Home Equipment: Walker - 2 wheels;Cane - single point Additional Comments: keeps table next to toilet and uses it like a grab bar    Prior Function Level of Independence: Independent with assistive device(s)         Comments: pt reports use of SPC and RW as needed for ambulation      Hand Dominance        Extremity/Trunk Assessment   Upper Extremity Assessment Upper Extremity Assessment: Overall WFL for tasks assessed    Lower Extremity Assessment Lower Extremity Assessment: LLE deficits/detail LLE Deficits / Details: general L LE weakness noted during assessment; unable to fully assess due to pain LLE: Unable to fully assess due to pain    Cervical / Trunk Assessment Cervical / Trunk Assessment: Normal  Communication   Communication: Prefers language other than [place];Interpreter utilized (spanish speaking; interpreter: Adin Hector.)  Cognition Arousal/Alertness: Awake/alert Behavior During Therapy: WFL for tasks assessed/performed Overall Cognitive Status: Within Functional Limits for tasks assessed                                        General Comments      Exercises Total Joint Exercises Ankle Circles/Pumps: AROM;Strengthening;Both;10 reps;Supine (HOB elevated) Quad Sets: AROM;Strengthening;Both;10 reps;Supine (HOB elevated) Short Arc Quad: AAROM;Strengthening;Left;10 reps;Supine (HOB elevated) Heel Slides: AAROM;Strengthening;Left;15 reps;Supine (HOB elevated) Hip ABduction/ADduction: AAROM;Strengthening;Left;10 reps;Supine (HOB elevated; hip adduction to neutral) Straight Leg Raises: AAROM;Strengthening;Left;10 reps;Supine (HOB elevated) Goniometric ROM: L knee extension in supine with bone foam 9 degrees; L knee flexion in supine 45 degrees   Assessment/Plan    PT Assessment Patient needs continued PT services  PT Problem List Decreased  strength;Decreased range of motion;Decreased activity tolerance;Decreased balance;Decreased mobility;Decreased knowledge of use of DME       PT Treatment Interventions DME instruction;Gait training;Stair training;Functional mobility training;Therapeutic activities;Therapeutic exercise;Balance training;Patient/family education    PT Goals (Current goals can be found in the Care Plan section)  Acute Rehab PT Goals Patient Stated Goal: to decrease pain and return home PT Goal Formulation: With patient Time For Goal Achievement: 03/31/17 Potential to Achieve Goals: Good    Frequency BID   Barriers to discharge        Co-evaluation               AM-PAC PT "6 Clicks" Daily Activity  Outcome Measure Difficulty turning over in bed (including adjusting bedclothes, sheets and blankets)?: A Little Difficulty moving from lying on back to sitting on the side of the bed? : Total Difficulty sitting down on and standing up from a chair with arms (e.g., wheelchair, bedside commode, etc,.)?: Total Help needed moving to and from a bed to chair (including a wheelchair)?: A Little Help needed walking in hospital room?: A Little Help needed climbing 3-5 steps with a railing? : A Lot 6 Click Score: 13    End of Session Equipment Utilized During Treatment: Gait belt;Left knee immobilizer Activity Tolerance: Patient limited by pain Patient left: in chair;with call bell/phone within reach;with chair alarm set;with SCD's reapplied (B heels elevated via towel rolls; polar care  in place and activated; SCD's in place and activated) Nurse Communication: Mobility status;Precautions;Weight bearing status (via whiteboard) PT Visit Diagnosis: Unsteadiness on feet (R26.81);Other abnormalities of gait and mobility (R26.89);Muscle weakness (generalized) (M62.81);Pain Pain - Right/Left: Left Pain - part of body: Knee    Time: 5498-2641 PT Time Calculation (min) (ACUTE ONLY): 46 min   Charges:          PT G CodesLaqueta Carina, SPT 03-29-17,11:59 AM [phone]

## 2017-03-17 NOTE — Progress Notes (Signed)
  Subjective: 1 Day Post-Op Procedure(s) (LRB): TOTAL KNEE ARTHROPLASTY (Left) Patient reports pain as 10 on 0-10 scale.  The patient is sitting comfortably with no grimacing in the bed. Patient seen in rounds with Dr. Rudene Christians. Patient is well, and has had no acute complaints or problems Plan is to go Home after hospital stay. Negative for chest pain and shortness of breath Fever: no Gastrointestinal: Negative for nausea and vomiting  Objective: Vital signs in last 24 hours: Temp:  [97.2 F (36.2 C)-99.4 F (37.4 C)] 98.9 F (37.2 C) (07/18 0703) Pulse Rate:  [54-76] 63 (07/18 0703) Resp:  [10-22] 19 (07/17 2316) BP: (92-156)/(57-130) 156/130 (07/18 0703) SpO2:  [95 %-100 %] 95 % (07/18 0703) Weight:  [81.2 kg (179 lb)] 81.2 kg (179 lb) (07/17 0816)  Intake/Output from previous day:  Intake/Output Summary (Last 24 hours) at 03/17/17 0708 Last data filed at 03/17/17 0618  Gross per 24 hour  Intake             2450 ml  Output             2025 ml  Net              425 ml    Intake/Output this shift: No intake/output data recorded.  Labs:  Recent Labs  03/16/17 1632 03/17/17 0358  HGB 13.0 10.9*    Recent Labs  03/16/17 1632 03/17/17 0358  WBC 22.5* 15.0*  RBC 5.20 4.40  HCT 40.0 33.4*  PLT 238 227    Recent Labs  03/16/17 1632 03/17/17 0358  NA  --  139  K  --  4.1  CL  --  105  CO2  --  28  BUN  --  18  CREATININE 0.55 0.70  GLUCOSE  --  187*  CALCIUM  --  8.9   No results for input(s): LABPT, INR in the last 72 hours.   EXAM General - Patient is Alert and Oriented Extremity - Sensation intact distally Dorsiflexion/Plantar flexion intact Compartment soft Dressing/Incision - clean, dry, no drainage Motor Function - intact, moving foot and toes well on exam.   Past Medical History:  Diagnosis Date  . Anemia   . Anxiety   . Arthritis   . Depression   . GERD (gastroesophageal reflux disease)   . Heart burn   . Hypertension      Assessment/Plan: 1 Day Post-Op Procedure(s) (LRB): TOTAL KNEE ARTHROPLASTY (Left) Active Problems:   Primary localized osteoarthritis of left knee  Estimated body mass index is 30.73 kg/m as calculated from the following:   Height as of this encounter: 5\' 4"  (1.626 m).   Weight as of this encounter: 81.2 kg (179 lb). Advance diet Up with therapy D/C IV fluids  Discharge planning home for Friday.  DVT Prophylaxis - Lovenox, Foot Pumps and TED hose Weight-Bearing as tolerated to left leg  Reche Dixon, PA-C Orthopaedic Surgery 03/17/2017, 7:08 AM

## 2017-03-17 NOTE — Progress Notes (Signed)
Physical Therapy Treatment Patient Details Name: Caitlyn [name] MRN: [id number] DOB: [birth year] Today's Date: 03/17/2017    History of Present Illness Pt is a spanish speaking 51 y.o. F s/p L TKA. 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    PT Comments    Spanish interpreter used throughout entire session: Jacqui Laukaitis. Pt able to ambulate to nursing station and back during session with L knee immobilizer, CGA and RW for safety. Pt reports 7/10 L knee pain beginning and end of session with increase in pain noted with therapeutic exercise. Pt required initial vc's for proper LE placement with sit-to-stand but was able to recall and demonstrate for sit-from-stand. Next session focus on increasing ambulation distance and initiating stair training.   Follow Up Recommendations  Home health PT     Equipment Recommendations  Rolling walker with 5" wheels    Recommendations for Other Services       Precautions / Restrictions Precautions Precautions: Knee;Fall Precaution Booklet Issued: No Required Braces or Orthoses: Knee Immobilizer - Left Restrictions Weight Bearing Restrictions: Yes LLE Weight Bearing: Weight bearing as tolerated    Mobility  Bed Mobility Overal bed mobility: Needs Assistance Bed Mobility: Sit to Supine       Sit to supine: Min assist;HOB elevated   General bed mobility comments: min assist for L LE  Transfers Overall transfer level: Needs assistance Equipment used: Rolling walker (2 wheeled) Transfers: Sit to/from Stand Sit to Stand: Min guard         General transfer comment: CGA with RW for sit-to/from-stand with intial vc's but good recall of proper LE placement to achieve movement with proper form  Ambulation/Gait Ambulation/Gait assistance: Min guard   Assistive device: Rolling walker (2 wheeled)   Gait velocity: decreased    General Gait Details: pt able to ambulate 20 ft to the door with L knee  immobilizer, CGA and RW for safety; standing rest break; pt able to walk 60 ft from door to nursing station and back to EOB with L knee immobilizer CGA and RW for safety; pt demonstrates with decreased stance time and step length on L LE; mild L hip circumduction likely due to L knee immobilizer   Stairs            Wheelchair Mobility    Modified Rankin (Stroke Patients Only)       Balance Overall balance assessment: Needs assistance Sitting-balance support: Single extremity supported;Feet supported Sitting balance-Leahy Scale: Good Sitting balance - Comments: static sitting balance with no overt loss of balance noted; able to reach within base of support for water bottle with no overt loss of balance noted   Standing balance support: Bilateral upper extremity supported Standing balance-Leahy Scale: Fair Standing balance comment: static standing balance and ambulation with CGA and RW for safety; no overt loss of balance noted                            Cognition Arousal/Alertness: Awake/alert Behavior During Therapy: WFL for tasks assessed/performed Overall Cognitive Status: Within Functional Limits for tasks assessed                                        Exercises Total Joint Exercises Short Arc Quad: AAROM;Strengthening;Left;10 reps;Supine (HOB elevated) Heel Slides: AAROM;Strengthening;Left;10 reps;Supine (HOB elevated) Straight Leg Raises: AAROM;Strengthening;Left;10 reps;Supine (HOB slightly elevated; knee immobilizer  due to not able to perform SLR) Goniometric ROM: L knee flexion sitting on EOB 64 degrees; pt reports it felt good to hold it in a flexed position for >1 minute Other Exercises Other Exercises: pt educated in polar care mgt and home/routines modifications to maximize safety and functional independence    General Comments        Pertinent Vitals/Pain Pain Assessment: 0-10 Pain Score: 7  Pain Location: L knee Pain  Descriptors / Indicators: Tightness;Tender;Aching Pain Intervention(s): Limited activity within patient's tolerance;Monitored during session;Premedicated before session;Repositioned  HR - 70 bpm beginning of session via dinamap to 81 bpm end of session via pulse ox O2 - 100% beginning of session via dinamap to 97% end of session on RA via pulse ox    Home Living Family/patient expects to be discharged to:: Private residence Living Arrangements: Spouse/significant other Available Help at Discharge: Family;Available 24 hours/day Type of Home: Apartment Home Access: Stairs to enter Entrance Stairs-Rails: Left Home Layout: One level Home Equipment: Walker - 2 wheels;Cane - single point Additional Comments: keeps table next to toilet and uses it like a grab bar    Prior Function Level of Independence: Independent with assistive device(s)      Comments: pt reports use of SPC and RW as needed for ambulation   PT Goals (current goals can now be found in the care plan section) Acute Rehab PT Goals Patient Stated Goal: to decrease pain and return home PT Goal Formulation: With patient Time For Goal Achievement: 03/31/17 Potential to Achieve Goals: Good Progress towards PT goals: Progressing toward goals    Frequency    BID      PT Plan Current plan remains appropriate    Co-evaluation              AM-PAC PT "6 Clicks" Daily Activity  Outcome Measure  Difficulty turning over in bed (including adjusting bedclothes, sheets and blankets)?: A Little Difficulty moving from lying on back to sitting on the side of the bed? : Total Difficulty sitting down on and standing up from a chair with arms (e.g., wheelchair, bedside commode, etc,.)?: Total Help needed moving to and from a bed to chair (including a wheelchair)?: A Little Help needed walking in hospital room?: A Little Help needed climbing 3-5 steps with a railing? : A Lot 6 Click Score: 13    End of Session Equipment  Utilized During Treatment: Gait belt;Left knee immobilizer Activity Tolerance: Patient tolerated treatment well;No increased pain Patient left: in bed;with nursing/sitter in room (nursing in room to assist with toileting; L knee immobilizer on at end of session) Nurse Communication: Mobility status;Weight bearing status;Precautions (via whiteboard) PT Visit Diagnosis: Unsteadiness on feet (R26.81);Other abnormalities of gait and mobility (R26.89);Muscle weakness (generalized) (M62.81);Pain Pain - Right/Left: Left Pain - part of body: Knee     Time: 6599-3570 PT Time Calculation (min) (ACUTE ONLY): 27 min  Charges:                      G CodesLaqueta Carina, SPT 04/15/2017,5:21 PM [phone]

## 2017-03-18 MED ORDER — ENOXAPARIN SODIUM 40 MG/0.4ML ~~LOC~~ SOLN: 40.0000 mg | SUBCUTANEOUS | 0 refills | Status: DC

## 2017-03-18 MED ORDER — TRAMADOL HCL 50 MG PO TABS: 50.0000 mg | ORAL_TABLET | ORAL | 1 refills | Status: DC | PRN

## 2017-03-18 MED ORDER — OXYCODONE HCL 5 MG PO TABS: 5.0000 mg | ORAL_TABLET | ORAL | 0 refills | Status: DC | PRN

## 2017-03-18 NOTE — Discharge Instructions (Signed)

## 2017-03-18 NOTE — Progress Notes (Signed)
Physical Therapy Treatment Patient Details Name: Caitlyn [name] MRN: [id number] DOB: 04-30-1966 Today's Date: 03/18/2017    History of Present Illness Pt is a spanish speaking 51 y.o. F s/p L TKA. 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    PT Comments    Pt in bed, agrees to session.  Premedicated by nursing.  Participated in exercises as described below.  She was able to get to edge of bed with min assist (KI donned) and ambulate to rehab gym for stair training.  She was able to go up/down with left rail 4 steps to simulate home environment.  She did require min a x 2 and leaned heavily over left rail.  Upon returning to bottom of steps she complained of feeling light headed.  She was seated in wheelchair and BP 90/53.  She was given a ride back to her room and transferred to recliner where LE's were elevated.  BP 110/62 and pt reported feeling better.  Further stair training and gait held this pm.  Discussed BP with primary nurse.    Pt does have 17 steps to enter 2nd floor apartment at home.  Pt will need further stair training and anticipate improvement tomorrow.     Follow Up Recommendations  Home health PT     Equipment Recommendations  Rolling walker with 5" wheels    Recommendations for Other Services       Precautions / Restrictions Precautions Precautions: Knee;Fall Precaution Booklet Issued: No Required Braces or Orthoses: Knee Immobilizer - Left Restrictions Weight Bearing Restrictions: Yes LLE Weight Bearing: Weight bearing as tolerated    Mobility  Bed Mobility Overal bed mobility: Needs Assistance Bed Mobility: Supine to Sit     Supine to sit: Min assist;HOB elevated     General bed mobility comments: min assist for L LE  Transfers Overall transfer level: Needs assistance Equipment used: Rolling walker (2 wheeled) Transfers: Sit to/from Stand Sit to Stand: Min guard Stand pivot transfers: Min guard       General  transfer comment: CGA and RW for sit-to/from-stand with good recall of proper B LE placement to achieve proper form, min verbal cues for hand placements on walker  Ambulation/Gait Ambulation/Gait assistance: Min guard Ambulation Distance (Feet): 50 Feet Assistive device: Rolling walker (2 wheeled)   Gait velocity: decreased  Gait velocity interpretation: Below normal speed for age/gender General Gait Details: Gererally steady with min verbal cues for walker positionig   Stairs Stairs: Yes   Stair Management: One rail Left Number of Stairs: 4    Wheelchair Mobility    Modified Rankin (Stroke Patients Only)       Balance Overall balance assessment: Needs assistance Sitting-balance support: No upper extremity supported;Feet supported Sitting balance-Leahy Scale: Good Sitting balance - Comments: static sitting balance with no overt loss of balance noted; pt able to reach within base of support with no overt loss of balance   Standing balance support: Bilateral upper extremity supported Standing balance-Leahy Scale: Fair Standing balance comment: pt able to perform static standing balance with L knee immobilizer, CGA and RW for safety; pt able to remove hands to adjust clothing with no overt loss of balance noted                            Cognition Arousal/Alertness: Awake/alert Behavior During Therapy: WFL for tasks assessed/performed Overall Cognitive Status: Within Functional Limits for tasks assessed  Exercises Total Joint Exercises Ankle Circles/Pumps: AROM;Strengthening;Both;10 reps;Supine Quad Sets: AROM;Strengthening;Both;10 reps;Supine Short Arc Quad: AAROM;Strengthening;Left;10 reps;Supine Heel Slides: AAROM;Strengthening;Left;Supine;Seated;10 reps Hip ABduction/ADduction: AAROM;Strengthening;Left;10 reps;Supine Straight Leg Raises: AAROM;Strengthening;Supine;Left;10 reps Goniometric ROM: L knee  extension in supine 6 degrees; L knee flexion sitting on EOB with assist 75 degrees    General Comments        Pertinent Vitals/Pain Pain Assessment: 0-10 Pain Score: 8  Pain Location: L knee.  Reports minimal pain with gait but increased with exercises and after session. Pain Descriptors / Indicators: Tightness;Tender;Aching Pain Intervention(s): Limited activity within patient's tolerance;Monitored during session;Premedicated before session    Home Living                      Prior Function            PT Goals (current goals can now be found in the care plan section) Acute Rehab PT Goals Patient Stated Goal: to decrease pain and return home PT Goal Formulation: With patient Time For Goal Achievement: 03/31/17 Potential to Achieve Goals: Good Progress towards PT goals: Progressing toward goals    Frequency    BID      PT Plan Current plan remains appropriate    Co-evaluation              AM-PAC PT "6 Clicks" Daily Activity  Outcome Measure  Difficulty turning over in bed (including adjusting bedclothes, sheets and blankets)?: A Little Difficulty moving from lying on back to sitting on the side of the bed? : Total Difficulty sitting down on and standing up from a chair with arms (e.g., wheelchair, bedside commode, etc,.)?: Total Help needed moving to and from a bed to chair (including a wheelchair)?: A Little Help needed walking in hospital room?: A Little Help needed climbing 3-5 steps with a railing? : A Lot 6 Click Score: 13    End of Session Equipment Utilized During Treatment: Gait belt;Left knee immobilizer Activity Tolerance: Patient tolerated treatment well;Treatment limited secondary to medical complications (Comment) Patient left: in chair;with chair alarm set;with call bell/phone within reach Nurse Communication: Mobility status;Other (comment) PT Visit Diagnosis: Unsteadiness on feet (R26.81);Other abnormalities of gait and mobility  (R26.89);Muscle weakness (generalized) (M62.81);Pain Pain - Right/Left: Left Pain - part of body: Knee     Time: 1432-1510 PT Time Calculation (min) (ACUTE ONLY): 38 min  Charges:  $Gait Training: 8-22 mins $Therapeutic Exercise: 8-22 mins $Therapeutic Activity: 8-22 mins                    G Codes:       Chesley Noon, PTA 03/18/17, 3:24 PM

## 2017-03-18 NOTE — Progress Notes (Signed)
Patient refusing bone foam.   Deri Fuelling, RN

## 2017-03-18 NOTE — Progress Notes (Signed)
Physical Therapy Treatment Patient Details Name: Caitlyn [name] MRN: [id number] DOB: [birth year] Today's Date: 03/18/2017    History of Present Illness Pt is a spanish speaking 51 y.o. F s/p L TKA. 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    PT Comments    Pt able to increase ambulation distance to 140 ft with L knee immobilizer, CGA, and RW with short standing rest break for B UE relief. Pt reports mild dizziness at beginning of session; BP 154/80 mmHg via dinamap (nursing reassured pt stating that the pain meds are most likely causing the mild dizziness). Pt also reports decreased L knee pain (3/10 beginning and 5/10 end of session; premedicated) during today's session. Pt demonstrates good recall and form for B LE placement during transfers. Next session focus on increasing ambulation distance and initiating stair training.   Follow Up Recommendations  Home health PT     Equipment Recommendations  Rolling walker with 5" wheels    Recommendations for Other Services       Precautions / Restrictions Precautions Precautions: Knee;Fall Precaution Booklet Issued: No Required Braces or Orthoses: Knee Immobilizer - Left (pt unable to perform L SLR AROM) Restrictions Weight Bearing Restrictions: Yes LLE Weight Bearing: Weight bearing as tolerated    Mobility  Bed Mobility Overal bed mobility: Needs Assistance Bed Mobility: Supine to Sit     Supine to sit: Min assist;HOB elevated     General bed mobility comments: min assist for L LE  Transfers Overall transfer level: Needs assistance Equipment used: Rolling walker (2 wheeled) Transfers: Sit to/from Stand Sit to Stand: Min guard         General transfer comment: CGA and RW for sit-to/from-stand with good recall of proper B LE placement to achieve proper form  Ambulation/Gait Ambulation/Gait assistance: Min guard Ambulation Distance (Feet): 140 Feet Assistive device: Rolling walker (2  wheeled)   Gait velocity: decreased    General Gait Details: pt able to ambulate 140 ft with L knee immobilizer, CGA and RW from EOB, to the end of the hall next to nursing station, and back to her bedside chair with no reported increase in pain; pt demonstrates decreased stance time and step length on L LE; mild hip circumduction likely due to L knee immobilizer; pt also demonstrating a step-to gait pattern   Stairs            Wheelchair Mobility    Modified Rankin (Stroke Patients Only)       Balance Overall balance assessment: Needs assistance Sitting-balance support: No upper extremity supported;Feet supported Sitting balance-Leahy Scale: Good Sitting balance - Comments: static sitting balance with no overt loss of balance noted; pt able to reach within base of support with no overt loss of balance   Standing balance support: Bilateral upper extremity supported Standing balance-Leahy Scale: Fair Standing balance comment: pt able to perform static standing balance with L knee immobilizer, CGA and RW for safety; pt able to remove hands to adjust clothing with no overt loss of balance noted                            Cognition Arousal/Alertness: Awake/alert Behavior During Therapy: WFL for tasks assessed/performed Overall Cognitive Status: Within Functional Limits for tasks assessed  Exercises Total Joint Exercises Short Arc Quad: AAROM;Strengthening;Left;10 reps;Supine (HOB elevated) Heel Slides: AAROM;Strengthening;Left;20 reps;Supine;Seated (10 reps in supine with HOB elevated and 10 reps on EOB with towel under foot to facilitate hamstring activation) Straight Leg Raises: AAROM;Strengthening;Left;10 reps;Supine (HOB elevated; unable to perform AROM: L knee immobilizer) Goniometric ROM: L knee extension in supine 6 degrees; L knee flexion sitting on EOB with assist 75 degrees    General Comments         Pertinent Vitals/Pain Pain Assessment: 0-10 Pain Score: 3  (increasing to 5/10 during and end of session) Pain Location: L knee Pain Descriptors / Indicators: Tightness;Tender;Aching Pain Intervention(s): Limited activity within patient's tolerance;Monitored during session;Premedicated before session;Repositioned;Ice applied  HR - 83 to 102 bpm throughout session via pulse ox O2 - 95 to 98% on RA throughout session via pulse ox BP - 154/80 mmHg beginning of session via dinamap (due to complaints of mild dizziness; nursing explained that this is likely due to pain medication)    Home Living                      Prior Function            PT Goals (current goals can now be found in the care plan section) Acute Rehab PT Goals Patient Stated Goal: to decrease pain and return home PT Goal Formulation: With patient Time For Goal Achievement: 03/31/17 Potential to Achieve Goals: Good Progress towards PT goals: Progressing toward goals    Frequency    BID      PT Plan Current plan remains appropriate    Co-evaluation              AM-PAC PT "6 Clicks" Daily Activity  Outcome Measure  Difficulty turning over in bed (including adjusting bedclothes, sheets and blankets)?: A Little Difficulty moving from lying on back to sitting on the side of the bed? : Total Difficulty sitting down on and standing up from a chair with arms (e.g., wheelchair, bedside commode, etc,.)?: Total Help needed moving to and from a bed to chair (including a wheelchair)?: A Little Help needed walking in hospital room?: A Little Help needed climbing 3-5 steps with a railing? : A Lot 6 Click Score: 13    End of Session Equipment Utilized During Treatment: Gait belt;Left knee immobilizer Activity Tolerance: Patient tolerated treatment well Patient left: in chair;with call bell/phone within reach;with chair alarm set;with SCD's reapplied (B heels elevated via towel rolls; SCD's in place and  activated; polar care in place and activated) Nurse Communication: Mobility status;Precautions;Weight bearing status (via whiteboard) PT Visit Diagnosis: Unsteadiness on feet (R26.81);Other abnormalities of gait and mobility (R26.89);Muscle weakness (generalized) (M62.81);Pain Pain - Right/Left: Left Pain - part of body: Knee     Time: 7342-8768 PT Time Calculation (min) (ACUTE ONLY): 38 min  Charges:                       G CodesLaqueta Carina, SPT 31-Mar-2017,12:23 PM [phone]

## 2017-03-18 NOTE — Progress Notes (Signed)
Clinical Social Worker (CSW) received SNF consult. PT is recommending home health. RN case manager aware of above. Please reconsult if future social work needs arise. CSW signing off.   Umaiza Matusik, LCSW [phone] 

## 2017-03-18 NOTE — Progress Notes (Signed)
  Subjective: 2 Days Post-Op Procedure(s) (LRB): TOTAL KNEE ARTHROPLASTY (Left) Patient reports pain as moderate.   Patient seen in rounds with Dr. Rudene Christians. Patient is well, and has had no acute complaints or problems Plan is to go Home after hospital stay. Planning for tomorrow. Negative for chest pain and shortness of breath Fever: no Gastrointestinal: Negative for nausea and vomiting  Objective: Vital signs in last 24 hours: Temp:  [98.3 F (36.8 C)-100.6 F (38.1 C)] 99.8 F (37.7 C) (07/18 2321) Pulse Rate:  [63-81] 81 (07/18 2321) Resp:  [19-20] 19 (07/18 2321) BP: (130-161)/(78-130) 160/86 (07/18 2321) SpO2:  [94 %-100 %] 95 % (07/18 2321)  Intake/Output from previous day:  Intake/Output Summary (Last 24 hours) at 03/18/17 0649 Last data filed at 03/18/17 0300  Gross per 24 hour  Intake          1841.25 ml  Output             1175 ml  Net           666.25 ml    Intake/Output this shift: Total I/O In: 200 [P.O.:200] Out: 625 [Urine:625]  Labs:  Recent Labs  03/16/17 1632 03/17/17 0358  HGB 13.0 10.9*    Recent Labs  03/16/17 1632 03/17/17 0358  WBC 22.5* 15.0*  RBC 5.20 4.40  HCT 40.0 33.4*  PLT 238 227    Recent Labs  03/16/17 1632 03/17/17 0358  NA  --  139  K  --  4.1  CL  --  105  CO2  --  28  BUN  --  18  CREATININE 0.55 0.70  GLUCOSE  --  187*  CALCIUM  --  8.9   No results for input(s): LABPT, INR in the last 72 hours.   EXAM General - Patient is Alert and Oriented Extremity - Sensation intact distally Dorsiflexion/Plantar flexion intact Compartment soft Dressing/Incision - clean, dry, no drainage. The surgical bandage was changed to a honeycomb dressing. Motor Function - intact, moving foot and toes well on exam.   Past Medical History:  Diagnosis Date  . Anemia   . Anxiety   . Arthritis   . Depression   . GERD (gastroesophageal reflux disease)   . Heart burn   . Hypertension     Assessment/Plan: 2 Days Post-Op  Procedure(s) (LRB): TOTAL KNEE ARTHROPLASTY (Left) Active Problems:   Primary localized osteoarthritis of left knee  Estimated body mass index is 30.73 kg/m as calculated from the following:   Height as of this encounter: 5\' 4"  (1.626 m).   Weight as of this encounter: 81.2 kg (179 lb). Advance diet Up with therapy D/C IV fluids  Discharge planning home for Friday.  DVT Prophylaxis - Lovenox, Foot Pumps and TED hose Weight-Bearing as tolerated to left leg  Reche Dixon, PA-C Orthopaedic Surgery 03/18/2017, 6:49 AM

## 2017-03-19 NOTE — Progress Notes (Signed)
Physical Therapy Treatment Patient Details Name: Caitlyn [name] MRN: [id number] DOB: Jan 02, 1966 Today's Date: 03/19/2017    History of Present Illness Pt is a spanish speaking 51 y.o. F s/p L TKA. 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    PT Comments    Spanish interpreter used throughout entire session: Caitlyn [name]. Pt able to perform sit-to-stand with CGA and RW; pt reports severe dizziness in standing; pt sat to EOB and to supine with BP measured in both positions via dinamap (86/56 mmHg on EOB; 100/50 mmHg in supine). Pt also reports severe nausea during bed exercises and requests ending session due to nausea. Pt reports 5/10 L knee pain beginning and end of session with no reported increase in L knee pain during session (pre-medicated). Pt demonstrates good recall of proper form for sit to/from stand. Next session focus on increasing ambulation distance and stair training.    Follow Up Recommendations  Home health PT     Equipment Recommendations  Rolling walker with 5" wheels    Recommendations for Other Services       Precautions / Restrictions Precautions Precautions: Knee;Fall Precaution Booklet Issued: No Required Braces or Orthoses: Knee Immobilizer - Left (pt unable to perform AROM L SLR) Restrictions Weight Bearing Restrictions: Yes LLE Weight Bearing: Weight bearing as tolerated    Mobility  Bed Mobility Overal bed mobility: Needs Assistance Bed Mobility: Supine to Sit;Sit to Supine     Supine to sit: Min assist;HOB elevated Sit to supine: Min assist;HOB elevated   General bed mobility comments: min assist for L LE  Transfers Overall transfer level: Needs assistance Equipment used: Rolling walker (2 wheeled) Transfers: Sit to/from Stand Sit to Stand: Min guard         General transfer comment: CGA and RW for sit-to/from-stand with good recall of proper B LE placement to achieve proper form, min verbal cues for hand  placements on walker  Ambulation/Gait             General Gait Details: deferred due to reports of dizziness in standing; BP sitting on EOB 86/56; BP in supine 100/50   Stairs         General stair comments: deferred due to BP (low)  Wheelchair Mobility    Modified Rankin (Stroke Patients Only)       Balance Overall balance assessment: Needs assistance Sitting-balance support: No upper extremity supported;Feet supported Sitting balance-Leahy Scale: Good Sitting balance - Comments: static sitting balance with no overt loss of balance noted; pt able to reach within base of support for her water bottle with no overt loss of balance   Standing balance support: Bilateral upper extremity supported Standing balance-Leahy Scale: Fair Standing balance comment: pt able to perform static standing balance with L knee immobilizer, CGA and RW for safety                            Cognition Arousal/Alertness: Awake/alert Behavior During Therapy: WFL for tasks assessed/performed Overall Cognitive Status: Within Functional Limits for tasks assessed                                        Exercises Total Joint Exercises Ankle Circles/Pumps: AROM;Strengthening;Both;10 reps;Supine (HOB elevated) Quad Sets: AROM;Strengthening;Left;10 reps;Supine (HOB elevated) Straight Leg Raises: AAROM;Strengthening;Left;10 reps;Supine (HOB elevated) Goniometric ROM: L knee extension in supine 5 degrees; L  knee flexion deferred due to pt refusal due to reports of severe nausea (will re-attempt next session) Other Exercises Other Exercises: exercise program held after 3 exercises due to pt refusing due to reported severe nausea    General Comments        Pertinent Vitals/Pain Pain Assessment: 0-10 Pain Score: 5  Pain Location: L knee Pain Descriptors / Indicators: Tightness;Tender;Aching Pain Intervention(s): Limited activity within patient's tolerance;Monitored  during session;Premedicated before session;Repositioned;Ice applied  HR - 92 to 103 bpm throughout session via pulse ox O2 - 96 to 98% on RA throughout session via pulse ox BP - 86/56 mmHg sitting on EOB via dinamap; 100/50 mmHg in supine via dinamap    Home Living                      Prior Function            PT Goals (current goals can now be found in the care plan section) Acute Rehab PT Goals Patient Stated Goal: to decrease pain and return home PT Goal Formulation: With patient Time For Goal Achievement: 03/31/17 Potential to Achieve Goals: Good Progress towards PT goals: PT to reassess next treatment (unable to assess due to drop in BP and reported nausea)    Frequency    BID      PT Plan Current plan remains appropriate    Co-evaluation              AM-PAC PT "6 Clicks" Daily Activity  Outcome Measure  Difficulty turning over in bed (including adjusting bedclothes, sheets and blankets)?: A Little Difficulty moving from lying on back to sitting on the side of the bed? : Total Difficulty sitting down on and standing up from a chair with arms (e.g., wheelchair, bedside commode, etc,.)?: Total Help needed moving to and from a bed to chair (including a wheelchair)?: A Little Help needed walking in hospital room?: A Little Help needed climbing 3-5 steps with a railing? : A Lot 6 Click Score: 13    End of Session Equipment Utilized During Treatment: Gait belt;Left knee immobilizer Activity Tolerance: Treatment limited secondary to medical complications (Comment) (blood pressure) Patient left: in bed;with call bell/phone within reach;with bed alarm set;with family/visitor present;with SCD's reapplied (L heel elevated via bone foam; R heel elevated via towel roll; SCD's in place and activated; polar care in place and activated) Nurse Communication: Mobility status;Other (comment) (nursing notified of drop in blood pressure and reported nausea) PT Visit  Diagnosis: Unsteadiness on feet (R26.81);Other abnormalities of gait and mobility (R26.89);Muscle weakness (generalized) (M62.81);Pain Pain - Right/Left: Left Pain - part of body: Knee     Time: 8546-2703 PT Time Calculation (min) (ACUTE ONLY): 28 min  Charges:                       G CodesLaqueta Carina, SPT Apr 11, 2017,11:51 AM [phone]

## 2017-03-19 NOTE — Progress Notes (Signed)
Patient experiencing ortho static hypotension. BP lying down 125/72 and sitting up is 81/41. MD notified and discontinued discharge, order RN to hold all BP medications. RN will continue to monitor her. Patient denies dizziness or nausea at this time.   Deri Fuelling, RN

## 2017-03-19 NOTE — Progress Notes (Signed)
Physical Therapy Treatment Patient Details Name: Caitlyn [name] MRN: [id number] DOB: December 27, 1965 Today's Date: 03/19/2017    History of Present Illness Pt is a spanish speaking 51 y.o. F s/p L TKA. 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    PT Comments    Spanish interpreter used throughout entire session: Marva Panda. Pt's blood pressure taken in supine, sitting on EOB, and standing beginning of session via dinamap (106/58 mmHg in supine; 85/48 mmHg on EOB; 78/50 mmHg in standing); 109/60 end of session in supine via dinamap. Pt able to perform supine to sit modified independent using her R LE to assist L LE. Pt able to perform sit-to-stand with CGA and RW. Pt reports minimal increased dizziness from supine to sit and moderate increase in dizziness from sit to stand. Pt reports 4/10 L knee pain beginning of session and 8/10 L knee pain end of session (limiting L knee flexion ROM to 56). Pt demonstrates good recall of proper form for sit to/from stand again this session. Next session focus on increasing ambulation distance and stair training with controlled BP.    Follow Up Recommendations  Home health PT     Equipment Recommendations  Rolling walker with 5" wheels    Recommendations for Other Services       Precautions / Restrictions Precautions Precautions: Knee;Fall Precaution Booklet Issued: No Required Braces or Orthoses: Knee Immobilizer - Left (pt unable to perform L SLR) Restrictions Weight Bearing Restrictions: Yes LLE Weight Bearing: Weight bearing as tolerated    Mobility  Bed Mobility Overal bed mobility: Needs Assistance Bed Mobility: Supine to Sit;Sit to Supine     Supine to sit: HOB elevated;Modified independent (Device/Increase time) Sit to supine: Min assist;HOB elevated   General bed mobility comments: min assist for L LE for sit to supine; modified independent with increased time/effort with HOB elevated for supine to sit with  vc's to use her R LE to assist L LE  Transfers Overall transfer level: Needs assistance Equipment used: Rolling walker (2 wheeled) Transfers: Sit to/from Stand Sit to Stand: Min guard Stand pivot transfers: Min guard       General transfer comment: CGA and RW for sit-to/from-stand with good recall of proper B LE placement to achieve proper form; good recall of B UE placement on RW  Ambulation/Gait             General Gait Details: deferred due to BP (low)   Stairs         General stair comments: deferred due to BP (low)  Wheelchair Mobility    Modified Rankin (Stroke Patients Only)       Balance Overall balance assessment: Needs assistance Sitting-balance support: Single extremity supported;Feet supported Sitting balance-Leahy Scale: Good Sitting balance - Comments: static sitting balance with no overt loss of balance noted   Standing balance support: Bilateral upper extremity supported Standing balance-Leahy Scale: Fair Standing balance comment: pt able to perform static standing balance with L knee immobilizer, CGA and RW for safety; able to remove R UE from walker for BP to be assessed                            Cognition Arousal/Alertness: Awake/alert Behavior During Therapy: WFL for tasks assessed/performed Overall Cognitive Status: Within Functional Limits for tasks assessed  Exercises Total Joint Exercises Ankle Circles/Pumps: AROM;Strengthening;Both;10 reps;Supine (HOB elevated) Quad Sets: AROM;Strengthening;Both;10 reps;Supine (HOB elevated) Short Arc Quad: AAROM;Strengthening;Left;10 reps;Supine (HOB elevated) Heel Slides: AAROM;Strengthening;Left;15 reps;Supine (HOB elevated) Hip ABduction/ADduction: AAROM;Strengthening;Left;10 reps;Supine (HOB elevated) Straight Leg Raises: AAROM;Strengthening;Left;10 reps;Supine (HOB elevated) Goniometric ROM: L knee extension in supine 5  degrees (from morning session); L knee flexion 56 degrees sitting on EOB (limited due to significant increase in pain)    General Comments        Pertinent Vitals/Pain Pain Assessment: 0-10 Pain Score: 8  Pain Location: L knee Pain Descriptors / Indicators: Tightness;Tender;Aching;Shooting;Operative site guarding Pain Intervention(s): Limited activity within patient's tolerance;Monitored during session;Premedicated before session;Repositioned;Ice applied  HR - 98 to 106 bpm throughout session via pulse ox O2 - 96 to 100% on RA throughout session via pulse ox BP - 106/58 mmHg supine beginning of session via dinamap; 85/48 mmHg sitting on EOB via dinamap; 78/50 mmHg in standing via dinamap; 102/59 mmHg after report of decreased symptoms in supine via dinamap; 109/60 mmHg end of session in supine via [place]                      Prior Function            PT Goals (current goals can now be found in the care plan section) Acute Rehab PT Goals Patient Stated Goal: to decrease pain and return home PT Goal Formulation: With patient Time For Goal Achievement: 03/31/17 Potential to Achieve Goals: Good Progress towards PT goals: PT to reassess next treatment (bed exercises due to drop in blood pressure)    Frequency    BID      PT Plan Current plan remains appropriate    Co-evaluation              AM-PAC PT "6 Clicks" Daily Activity  Outcome Measure  Difficulty turning over in bed (including adjusting bedclothes, sheets and blankets)?: A Little Difficulty moving from lying on back to sitting on the side of the bed? : A Lot Difficulty sitting down on and standing up from a chair with arms (e.g., wheelchair, bedside commode, etc,.)?: Total Help needed moving to and from a bed to chair (including a wheelchair)?: A Little Help needed walking in hospital room?: A Little Help needed climbing 3-5 steps with a railing? : A Lot 6 Click Score: 14    End  of Session Equipment Utilized During Treatment: Gait belt;Left knee immobilizer Activity Tolerance: Treatment limited secondary to medical complications (Comment) (blood pressure) Patient left: in bed;with call bell/phone within reach;with bed alarm set;with SCD's reapplied (L heel elevated via bone foam; R heel elevated via towel roll; polar care in place and activated; SCD's in place and activated) Nurse Communication: Mobility status;Other (comment) (nursing notified about increased pain and drop in blood pressure) PT Visit Diagnosis: Unsteadiness on feet (R26.81);Other abnormalities of gait and mobility (R26.89);Muscle weakness (generalized) (M62.81);Pain Pain - Right/Left: Left Pain - part of body: Knee     Time: 2130-8657 PT Time Calculation (min) (ACUTE ONLY): 47 min  Charges:                       G CodesLaqueta Carina, SPT 04/08/2017,4:16 PM [phone]

## 2017-03-19 NOTE — Care Management Note (Signed)
Case Management Note  Patient Details  Name: Caitlyn [name] MRN: [id number] Date of Birth: 09/25/1965  Subjective/Objective:   Discharging today                 Action/Plan: [facility] notified of discharge. BSC delivered. Cost of Lovenox is $ 0.   Expected Discharge Date:  03/19/17               Expected Discharge Plan:  [name]  In-House Referral:     Discharge planning Services  CM Consult  Post Acute Care Choice:  Durable Medical Equipment, Home Health Choice offered to:  Patient  DME Arranged:  Eelevated commode seat DME Agency:  [place]:  PT, OT [place] Agency:  [facility] (formerly [facility])  Status of Service:  Completed, signed off  If discussed at [employer], dates discussed:    Additional Comments:  Jolly Mango, RN 03/19/2017, 8:39 AM

## 2017-03-19 NOTE — Progress Notes (Signed)
Honeycomb dressing changed.  Deri Fuelling, RN

## 2017-03-19 NOTE — Progress Notes (Signed)
  Subjective: 3 Days Post-Op Procedure(s) (LRB): TOTAL KNEE ARTHROPLASTY (Left) Patient reports pain as moderate.   Patient seen in rounds with Dr. Rudene Christians. Patient is well, and has had no acute complaints or problems Plan is to go Home after hospital stay. Planning for today. Negative for chest pain and shortness of breath Fever: no Gastrointestinal: Negative for nausea and vomiting  Objective: Vital signs in last 24 hours: Temp:  [99 F (37.2 C)-99.7 F (37.6 C)] 99 F (37.2 C) (07/19 2346) Pulse Rate:  [70-95] 95 (07/19 2346) Resp:  [16-20] 16 (07/19 2346) BP: (122-173)/(71-80) 130/71 (07/19 2346) SpO2:  [97 %-98 %] 97 % (07/19 2346)  Intake/Output from previous day:  Intake/Output Summary (Last 24 hours) at 03/19/17 0626 Last data filed at 03/19/17 0241  Gross per 24 hour  Intake              960 ml  Output                0 ml  Net              960 ml    Intake/Output this shift: Total I/O In: 480 [P.O.:480] Out: -   Labs:  Recent Labs  03/16/17 1632 03/17/17 0358  HGB 13.0 10.9*    Recent Labs  03/16/17 1632 03/17/17 0358  WBC 22.5* 15.0*  RBC 5.20 4.40  HCT 40.0 33.4*  PLT 238 227    Recent Labs  03/16/17 1632 03/17/17 0358  NA  --  139  K  --  4.1  CL  --  105  CO2  --  28  BUN  --  18  CREATININE 0.55 0.70  GLUCOSE  --  187*  CALCIUM  --  8.9   No results for input(s): LABPT, INR in the last 72 hours.   EXAM General - Patient is Alert and Oriented Extremity - Sensation intact distally Dorsiflexion/Plantar flexion intact Compartment soft Dressing/Incision - clean, dry, Scant bloody drainage.  Motor Function - intact, moving foot and toes well on exam. Ambulated 140 feet  Past Medical History:  Diagnosis Date  . Anemia   . Anxiety   . Arthritis   . Depression   . GERD (gastroesophageal reflux disease)   . Heart burn   . Hypertension     Assessment/Plan: 3 Days Post-Op Procedure(s) (LRB): TOTAL KNEE ARTHROPLASTY  (Left) Active Problems:   Primary localized osteoarthritis of left knee  Estimated body mass index is 30.73 kg/m as calculated from the following:   Height as of this encounter: 5\' 4"  (1.626 m).   Weight as of this encounter: 81.2 kg (179 lb). Advance diet Up with therapy D/C IV fluids  Discharge planning home for Friday. Bowel management before discharge.  DVT Prophylaxis - Lovenox, Foot Pumps and TED hose Weight-Bearing as tolerated to left leg  Reche Dixon, PA-C Orthopaedic Surgery 03/19/2017, 6:26 AM

## 2017-03-19 NOTE — Discharge Summary (Addendum)
Physician Discharge Summary  Subjective: 3 Days Post-Op Procedure(s) (LRB): TOTAL KNEE ARTHROPLASTY (Left) Patient reports pain as mild.   Patient seen in rounds with Dr. Rudene Christians. Patient is well, and has had no acute complaints or problems Patient is ready to go Home with home health physical therapy  Physician Discharge Summary  Patient ID: Caitlyn [name] MRN: [id number] DOB/AGE: 51/26/1967 51 y.o.  Admit date: 03/16/2017 Discharge date:03/20/2017 Admission Diagnoses:  Discharge Diagnoses:  Active Problems:   Primary localized osteoarthritis of left knee   Discharged Condition: good  Hospital Course: The patient is postop day 3 from a left total knee replacement. She is doing much better and is ready to go home. She ambulated 140 feet with physical therapy. Her pain control is becoming more manageable. She does seem to have a bowel movement before discharge. On post op day 3 patient became orthostatic, she experienced dizziness and hypotension with standing. She was given IV fluids, BP meds were held. Patient was stable and ready for discharge with no orthostatic hypotension and dizziness on post op day 4.  Treatments: surgery:  TOTAL KNEE ARTHROPLASTY (Left)  SURGEON: Laurene Footman, MD  ASSISTANTS: None  ANESTHESIA:   spinal  EBL:  Total I/O In: 1100 [I.V.:1000; IV Piggyback:100] Out: -   BLOOD ADMINISTERED:none  DRAINS: none   LOCAL MEDICATIONS USED:  MARCAINE    and OTHER exparel and morphine  SPECIMEN:  No Specimen  DISPOSITION OF SPECIMEN:  N/A  COUNTS:  YES  TOURNIQUET:   62 minutes at 300 mmHg  IMPLANTS: Medacta GMK sphere size 2 left femur to tibia with 12 mm insert and short stem, to patella all components cemented  Discharge Exam: Blood pressure 130/71, pulse 95, temperature 99 F (37.2 C), temperature source Oral, resp. rate 16, height 5\' 4"  (1.626 m), weight 81.2 kg (179 lb), last menstrual period 01/07/2017, SpO2 97  %.   Disposition: 01-Home or Self Care   Allergies as of 03/19/2017   No Known Allergies     Medication List    TAKE these medications   acetaminophen 325 MG tablet Commonly known as:  TYLENOL Take 650 mg by mouth every 6 (six) hours as needed.   aspirin EC 325 MG tablet Take 162.5 mg by mouth daily.   docusate sodium 100 MG capsule Commonly known as:  COLACE Take 1-2 capsules (100-200 mg total) by mouth 2 (two) times daily as needed (for constipation).   enoxaparin 40 MG/0.4ML injection Commonly known as:  LOVENOX Inject 0.4 mLs (40 mg total) into the skin daily.   ferrous sulfate 325 (65 FE) MG tablet Take 1 tablet (325 mg total) by mouth 2 (two) times daily with a meal.   ibuprofen 800 MG tablet Commonly known as:  ADVIL,MOTRIN Take 1 tablet (800 mg total) by mouth 3 (three) times daily. What changed:  when to take this  reasons to take this   lisinopril 20 MG tablet Commonly known as:  PRINIVIL,ZESTRIL Take 20 mg by mouth daily with breakfast.   nystatin-triamcinolone ointment Commonly known as:  MYCOLOG Apply 1 application topically 2 (two) times daily.   omeprazole 20 MG capsule Commonly known as:  PRILOSEC Take 20 mg by mouth daily as needed (for stomach issues/heartburn/indigestion).   oxyCODONE 5 MG immediate release tablet Commonly known as:  Oxy IR/ROXICODONE Take 1-2 tablets (5-10 mg total) by mouth every 4 (four) hours as needed for breakthrough pain.   sertraline 50 MG tablet Commonly known as:  ZOLOFT Take 50 mg  by mouth daily at 2 PM.   traMADol 50 MG tablet Commonly known as:  ULTRAM Take 1 tablet (50 mg total) by mouth as needed.   zolpidem 10 MG tablet Commonly known as:  AMBIEN Take 10 mg by mouth at bedtime.      Follow-up Information    Hessie Knows, MD Follow up in 2 week(s).   Specialty:  Orthopedic Surgery Why:  For staple removal Contact information: [place] [address] [phone]            Signed: Prescott Parma, TODD 03/19/2017, 6:28 AM   Objective: Vital signs in last 24 hours: Temp:  [99 F (37.2 C)-99.7 F (37.6 C)] 99 F (37.2 C) (07/19 2346) Pulse Rate:  [70-95] 95 (07/19 2346) Resp:  [16-20] 16 (07/19 2346) BP: (122-173)/(71-80) 130/71 (07/19 2346) SpO2:  [97 %-98 %] 97 % (07/19 2346)  Intake/Output from previous day:  Intake/Output Summary (Last 24 hours) at 03/19/17 0628 Last data filed at 03/19/17 0241  Gross per 24 hour  Intake              960 ml  Output                0 ml  Net              960 ml    Intake/Output this shift: Total I/O In: 480 [P.O.:480] Out: -   Labs:  Recent Labs  03/16/17 1632 03/17/17 0358  HGB 13.0 10.9*    Recent Labs  03/16/17 1632 03/17/17 0358  WBC 22.5* 15.0*  RBC 5.20 4.40  HCT 40.0 33.4*  PLT 238 227    Recent Labs  03/16/17 1632 03/17/17 0358  NA  --  139  K  --  4.1  CL  --  105  CO2  --  28  BUN  --  18  CREATININE 0.55 0.70  GLUCOSE  --  187*  CALCIUM  --  8.9   No results for input(s): LABPT, INR in the last 72 hours.  EXAM: General - Patient is Alert and Oriented Extremity - Sensation intact distally Dorsiflexion/Plantar flexion intact No cellulitis present Incision - clean, dry, blood tinged drainage Motor Function -  plantarflexion and dorsiflexion of her foot is intact.  Assessment/Plan: 3 Days Post-Op Procedure(s) (LRB): TOTAL KNEE ARTHROPLASTY (Left) Procedure(s) (LRB): TOTAL KNEE ARTHROPLASTY (Left) Past Medical History:  Diagnosis Date  . Anemia   . Anxiety   . Arthritis   . Depression   . GERD (gastroesophageal reflux disease)   . Heart burn   . Hypertension    Active Problems:   Primary localized osteoarthritis of left knee  Estimated body mass index is 30.73 kg/m as calculated from the following:   Height as of this encounter: 5\' 4"  (1.626 m).   Weight as of this encounter: 81.2 kg (179 lb). Discharge home with home health Diet - Regular diet Follow  up - in 2 weeks Activity - WBAT Disposition - Home Condition Upon Discharge - Good DVT Prophylaxis - Lovenox and TED hose  Reche Dixon, PA-C Orthopaedic Surgery 03/19/2017, 6:28 AM

## 2017-03-20 NOTE — Progress Notes (Signed)
OT Cancellation Note  Patient Details Name: Serria Sloma MRN: [id number] DOB: 1966-01-12   Cancelled Treatment:    Reason Eval/Treat Not Completed: Other (comment). Pt packing up belongings with spouse, imminently discharging from hospital. Will hold OT treatment at this time.  Jeni Salles, MPH, MS, OTR/L ascom [phone] 03/20/17, 10:43 AM

## 2017-03-20 NOTE — Progress Notes (Signed)
Physical Therapy Treatment Patient Details Name: Caitlyn [name] MRN: [id number] DOB: [birth year] Today's Date: 03/20/2017    History of Present Illness Pt is a spanish speaking 51 y.o. F s/p L TKA. 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    PT Comments    Session focused on mobility skills this session.  BP in supine 122/83, sitting edge of bed 117/70.  Pt with some c/o dizziness but she had recently received pain meds.  Dizziness did not interfere with mobility skills today.  She required min assist for LE management with bed mobility but was able to stand without assist to walker.  She ambulated to PT gym and was able to go up/down 4 steps with bilateral rails.  Husband in attendance for session and discussed stair options with pt and husband as she has a full flight with one rail to go up/down.  She continued to have difficulty with L rail only.  Pt was shown how to go up/down in a seated position due to her fatiguing quickly but she stated she would go up standing. Pt's husband stated that they have been going up/down with him supporting her on the right and they would do so upon going home.  They both stated they were comfortable with this technique. After seated rest she was able to ambulate around nursing unit with one additional seated rest due to UE fatigue.  To commode to void upon return to room.  Overall with improved mobility this session and less dizziness/orthostatic BP's. Both pt and husband voice understanding and agreement with discharge plan.  Spanish interpreter used during session Issaih.    Follow Up Recommendations  Home health PT     Equipment Recommendations  Rolling walker with 5" wheels    Recommendations for Other Services       Precautions / Restrictions Precautions Precautions: Knee;Fall Precaution Booklet Issued: No Required Braces or Orthoses: Knee Immobilizer - Left Restrictions Weight Bearing Restrictions: Yes LLE Weight  Bearing: Weight bearing as tolerated    Mobility  Bed Mobility Overal bed mobility: Needs Assistance Bed Mobility: Supine to Sit;Sit to Supine     Supine to sit: HOB elevated;Min assist     General bed mobility comments: for LE management  Transfers Overall transfer level: Needs assistance Equipment used: Rolling walker (2 wheeled) Transfers: Sit to/from Stand Sit to Stand: Supervision;Min guard         General transfer comment: CGA and RW for sit-to/from-stand with good recall of proper B LE placement to achieve proper form; good recall of B UE placement on RW  Ambulation/Gait   Ambulation Distance (Feet): 220 Feet Assistive device: Rolling walker (2 wheeled) Gait Pattern/deviations: Step-to pattern   Gait velocity interpretation: Below normal speed for age/gender     Stairs Stairs: Yes   Stair Management: Two rails Number of Stairs: 4    Wheelchair Mobility    Modified Rankin (Stroke Patients Only)       Balance Overall balance assessment: Needs assistance Sitting-balance support: Single extremity supported;Feet supported Sitting balance-Leahy Scale: Good     Standing balance support: Bilateral upper extremity supported Standing balance-Leahy Scale: Good                              Cognition Arousal/Alertness: Awake/alert Behavior During Therapy: WFL for tasks assessed/performed Overall Cognitive Status: Within Functional Limits for tasks assessed  Exercises Other Exercises Other Exercises: to commode with min assist to stand due to low surface.  Care without assist    General Comments        Pertinent Vitals/Pain Pain Assessment: 0-10 Pain Score: 6  Pain Location: L knee Pain Descriptors / Indicators: Operative site guarding;Sore Pain Intervention(s): Premedicated before session;Monitored during session;Limited activity within patient's tolerance    Home Living  Family/patient expects to be discharged to:: Private residence                    Prior Function            PT Goals (current goals can now be found in the care plan section) Progress towards PT goals: Progressing toward goals    Frequency           PT Plan Current plan remains appropriate    Co-evaluation              AM-PAC PT "6 Clicks" Daily Activity  Outcome Measure  Difficulty turning over in bed (including adjusting bedclothes, sheets and blankets)?: A Little Difficulty moving from lying on back to sitting on the side of the bed? : A Little Difficulty sitting down on and standing up from a chair with arms (e.g., wheelchair, bedside commode, etc,.)?: A Little Help needed moving to and from a bed to chair (including a wheelchair)?: A Little Help needed walking in hospital room?: A Little Help needed climbing 3-5 steps with a railing? : A Little 6 Click Score: 18    End of Session Equipment Utilized During Treatment: Gait belt;Left knee immobilizer Activity Tolerance: Patient tolerated treatment well Patient left: in bed;with call bell/phone within reach;with family/visitor present;with nursing/sitter in room Nurse Communication: Mobility status;Patient requests pain meds Pain - Right/Left: Left Pain - part of body: Knee     Time: 4037-5436 PT Time Calculation (min) (ACUTE ONLY): 37 min  Charges:  $Gait Training: 23-37 mins $Therapeutic Activity: 8-22 mins                    G Codes:       Chesley Noon, PTA 03/20/17, 10:47 AM

## 2017-03-20 NOTE — Care Management Note (Signed)
Case Management Note  Patient Details  Name: Caitlyn [name] MRN: [id number] Date of Birth: 1966/01/07  Subjective/Objective:      [name] has a RW at bedside for home use. A referral for HH-PT, OT was called to Ardeen Fillers at [facility]. Lovenox called in yesterday.               Action/Plan:   Expected Discharge Date:  03/20/17               Expected Discharge Plan:  [name]  In-House Referral:     Discharge planning Services  CM Consult  Post Acute Care Choice:  Durable Medical Equipment, Home Health Choice offered to:  Patient  DME Arranged:  Eelevated commode seat DME Agency:  [place]:  PT, OT [place] Agency:  [facility] (formerly [facility])  Status of Service:  Completed, signed off  If discussed at [employer], dates discussed:    Additional Comments:  Zayvion Stailey A, RN 03/20/2017, 8:30 AM

## 2017-03-20 NOTE — Progress Notes (Signed)
Spanish interpreter present for discharge summary review. Answered all questions. Changed dressing per order and sent one dressing home. Patient's husband stated he does not have a walker, only a "toilet."  Alerted CM, ok to send hospital walker and will have [facility] return once patient's is delivered to home. All belongings packed and given upon discharge.

## 2017-03-20 NOTE — Progress Notes (Signed)
   Subjective: 4 Days Post-Op Procedure(s) (LRB): TOTAL KNEE ARTHROPLASTY (Left) Patient reports pain as mild.   Patient is well, and has had no acute complaints or problems Denies any dizziness, CP, SOB, ABD pain. We will continue therapy today.  Plan is to go Home after hospital stay.  Objective: Vital signs in last 24 hours: Temp:  [99.4 F (37.4 C)-99.6 F (37.6 C)] 99.4 F (37.4 C) (07/20 2334) Pulse Rate:  [81-117] 88 (07/20 2334) Resp:  [16] 16 (07/20 2334) BP: (81-125)/(41-72) 109/65 (07/20 2334) SpO2:  [95 %-98 %] 95 % (07/20 2334)  Intake/Output from previous day: 07/20 0701 - 07/21 0700 In: 1320 [P.O.:1320] Out: 750 [Urine:750] Intake/Output this shift: No intake/output data recorded.  No results for input(s): HGB in the last 72 hours. No results for input(s): WBC, RBC, HCT, PLT in the last 72 hours. No results for input(s): NA, K, CL, CO2, BUN, CREATININE, GLUCOSE, CALCIUM in the last 72 hours. No results for input(s): LABPT, INR in the last 72 hours.  EXAM General - Patient is Alert, Appropriate and Oriented Extremity - Sensation intact distally Intact pulses distally Dorsiflexion/Plantar flexion intact No cellulitis present Compartment soft Dressing - dressing C/D/I and scant drainage Motor Function - intact, moving foot and toes well on exam.   Past Medical History:  Diagnosis Date  . Anemia   . Anxiety   . Arthritis   . Depression   . GERD (gastroesophageal reflux disease)   . Heart burn   . Hypertension     Assessment/Plan:   4 Days Post-Op Procedure(s) (LRB): TOTAL KNEE ARTHROPLASTY (Left) Active Problems:   Primary localized osteoarthritis of left knee  Estimated body mass index is 30.73 kg/m as calculated from the following:   Height as of this encounter: 5\' 4"  (1.626 m).   Weight as of this encounter: 81.2 kg (179 lb). [facility] diet Up with therapy  Plan on discharge to home today pending no dizziness/hypotension with PT.  Follow  up with [facility] in 2 weeks for staple removal.   DVT Prophylaxis - Lovenox, Foot Pumps and TED hose Weight-Bearing as tolerated to left leg   T. Rachelle Hora, PA-C [place] 03/20/2017, 7:43 AM

## 2017-03-20 NOTE — Care Management Note (Signed)
Case Management Note  Patient Details  Name: Caitlyn [name] MRN: [id number] Date of Birth: Jan 18, 1966  Subjective/Objective:     [name] denies having received a RW yesterday. Melene Muller was notified to ship a RW to [name]. [name] did not want to wait for delivery of a RW to her hospital room today from [facility]. After discussion with the charge nurse, it was agreed that [name] would take an [facility] RW home with her to use until the RW from [place] could be shipped to her home address. [facility] Fillers at [facility] agreed to have the [facility] the [facility] to [facility] after the new RW was received by [name].              Action/Plan:   Expected Discharge Date:  03/20/17               Expected Discharge Plan:  [place]  In-House Referral:     Discharge planning Services  CM Consult  Post Acute Care Choice:  Durable Medical Equipment, Home Health Choice offered to:  Patient  DME Arranged:  Eelevated commode seat DME Agency:  [place]:  PT, OT [place] Agency:  [facility] (formerly [facility]  Status of Service:  Completed, signed off  If discussed at [employer], dates discussed:    Additional Comments:  Goldman Birchall A, RN 03/20/2017, 12:00 PM

## 2017-03-22 ENCOUNTER — Ambulatory Visit
Admission: RE | Admit: 2017-03-22 | Discharge: 2017-03-22 | Disposition: A | Discharge status: Home / Self Care | Payer: BLUE CROSS/BLUE SHIELD | Source: Ambulatory Visit | Attending: Orthopedic Surgery | Admitting: Orthopedic Surgery

## 2017-03-22 ENCOUNTER — Other Ambulatory Visit: Payer: Self-pay | Admitting: Orthopedic Surgery

## 2017-03-22 ENCOUNTER — Ambulatory Visit: Payer: BLUE CROSS/BLUE SHIELD | Admitting: Urology

## 2017-03-22 DIAGNOSIS — Z9189 Other specified personal risk factors, not elsewhere classified: Secondary | ICD-10-CM

## 2017-03-22 DIAGNOSIS — M25462 Effusion, left knee: Secondary | ICD-10-CM | POA: Insufficient documentation

## 2017-03-22 DIAGNOSIS — R938 Abnormal findings on diagnostic imaging of other specified body structures: Secondary | ICD-10-CM | POA: Insufficient documentation

## 2017-04-12 ENCOUNTER — Encounter
Admission: RE | Admit: 2017-04-12 | Discharge: 2017-04-12 | Disposition: A | Discharge status: Home / Self Care | Payer: BLUE CROSS/BLUE SHIELD | Source: Ambulatory Visit | Attending: Orthopedic Surgery | Admitting: Orthopedic Surgery

## 2017-04-12 DIAGNOSIS — Z79899 Other long term (current) drug therapy: Secondary | ICD-10-CM | POA: Diagnosis not present

## 2017-04-12 DIAGNOSIS — Z01818 Encounter for other preprocedural examination: Secondary | ICD-10-CM

## 2017-04-12 DIAGNOSIS — Z96652 Presence of left artificial knee joint: Secondary | ICD-10-CM | POA: Diagnosis not present

## 2017-04-12 DIAGNOSIS — Z791 Long term (current) use of non-steroidal anti-inflammatories (NSAID): Secondary | ICD-10-CM | POA: Diagnosis not present

## 2017-04-12 DIAGNOSIS — F419 Anxiety disorder, unspecified: Secondary | ICD-10-CM | POA: Diagnosis not present

## 2017-04-12 DIAGNOSIS — Z7901 Long term (current) use of anticoagulants: Secondary | ICD-10-CM | POA: Diagnosis not present

## 2017-04-12 DIAGNOSIS — T8482XA Fibrosis due to internal orthopedic prosthetic devices, implants and grafts, initial encounter: Secondary | ICD-10-CM | POA: Diagnosis present

## 2017-04-12 DIAGNOSIS — I10 Essential (primary) hypertension: Secondary | ICD-10-CM | POA: Diagnosis not present

## 2017-04-12 DIAGNOSIS — Y838 Other surgical procedures as the cause of abnormal reaction of the patient, or of later complication, without mention of misadventure at the time of the procedure: Secondary | ICD-10-CM | POA: Diagnosis not present

## 2017-04-12 DIAGNOSIS — Z87891 Personal history of nicotine dependence: Secondary | ICD-10-CM | POA: Diagnosis not present

## 2017-04-12 DIAGNOSIS — F329 Major depressive disorder, single episode, unspecified: Secondary | ICD-10-CM | POA: Diagnosis not present

## 2017-04-12 NOTE — Patient Instructions (Signed)
Your procedure is scheduled on: @ADMITDT2 @ Thursday April 15, 2017 Su procedimiento est programado para: Report to .[facility]  Presntese a: To find out your arrival time please call [phone] between 1PM - 3PM on .April 14, 2017 Para saber su hora de llegada por favor llame al [phone] entre la 1PM - 3PM el da:  Remember: Instructions that are not followed completely may result in serious medical risk, up to and including death, or upon the discretion of your surgeon and anesthesiologist your surgery may need to be rescheduled.  Recuerde: Las instrucciones que no se siguen completamente Heritage manager en un riesgo de salud grave, incluyendo hasta la [place] o a discrecin de su cirujano y Environmental health practitioner, su ciruga se puede posponer.   __X__ 1. Do not eat food or drink liquids after midnight. No gum chewing or hard candies.  No coma alimentos ni tome lquidos despus de la medianoche.  No mastique chicle ni caramelos  duros.     __X__ 2. No alcohol for 24 hours before or after surgery.    No tome alcohol durante las 24 horas antes ni despus de la [place].   ____ 3. Bring all medications with you on the day of surgery if instructed.  X  Lleve todos los medicamentos con usted el da de su ciruga si se le ha indicado as  TRAIGA SOLO MEDICINAS NEUVAS   ___X_ 4. Notify your doctor if there is any change in your medical condition (cold, fever,                             infections).    Informe a su mdico si hay algn cambio en su condicin mdica (resfriado, fiebre, infecciones).   Do not wear jewelry, make-up, hairpins, clips or nail polish.  No use joyas, maquillajes, pinzas/ganchos para el cabello ni esmalte de uas.  Do not wear lotions, powders, or perfumes. You may NOT wear deodorant.  No use lociones, polvos o perfumes.   NO Puede usar desodorante.    Do not shave 48 hours prior to surgery. Men may shave face and neck.  No se afeite 48 horas antes de la  [place].  Los hombres pueden [employer] cara y el cuello.   Do not bring valuables to the hospital.   No lleve objetos [place] is not responsible for any belongings or valuables.  [place] no se hace responsable de ningn tipo de pertenencias u objetos de Geographical information systems officer.               Contacts, dentures or bridgework may not be worn into surgery.  Los lentes de [place], las dentaduras postizas o puentes no se pueden usar en la [place].  Leave your suitcase in the car. After surgery it may be brought to your room.  Deje su maleta en el auto.  Despus de la ciruga podr traerla a su habitacin.  For patients admitted to the hospital, discharge time is determined by your treatment team.  Para los pacientes que sean ingresados al hospital, el tiempo en el cual se le dar de alta es determinado por su                equipo de [place].   Patients discharged the day of surgery will not be allowed to drive home. A los pacientes que se les da de alta el mismo da de la ciruga no se  les permitir conducir a casa.   Please read over the following fact sheets that you were given: Por favor [place] informacin que le dieron:      __X__ Take these medicines the morning of surgery with A SIP OF WATER:          [employer] medicinas la maana de la ciruga con UN SORBO DE AGUA:  1. OMEPRAZOLE   2.   3.   4.       5.  6.             __X__ Use CHG Soap as directed          Utilice el jabn de CHG            __X__ Stop Coumadin/Plavix/aspirin on           Deje de tomar el Coumadin/Plavix/aspirina el da:  ___X_ Stop Anti-inflammatories UNTIL AFTER SURGERY          Deje de tomar antiinflamatorios el da:   ___X_ Stop supplements until after surgery -           Deje de tomar suplementos hasta despus de la [place] manager

## 2017-04-12 NOTE — Pre-Procedure Instructions (Signed)
INTERPRETER - [place] PAT

## 2017-04-15 ENCOUNTER — Ambulatory Visit: Payer: BLUE CROSS/BLUE SHIELD | Admitting: Certified Registered Nurse Anesthetist

## 2017-04-15 ENCOUNTER — Encounter
Admission: RE | Disposition: A | Discharge status: Home / Self Care | Payer: Self-pay | Source: Ambulatory Visit | Attending: Orthopedic Surgery

## 2017-04-15 ENCOUNTER — Ambulatory Visit
Admission: RE | Admit: 2017-04-15 | Discharge: 2017-04-15 | Disposition: A | Discharge status: Home / Self Care | Payer: BLUE CROSS/BLUE SHIELD | Source: Ambulatory Visit | Attending: Orthopedic Surgery | Admitting: Orthopedic Surgery

## 2017-04-15 ENCOUNTER — Encounter: Payer: Self-pay | Admitting: Certified Registered Nurse Anesthetist

## 2017-04-15 DIAGNOSIS — Z7901 Long term (current) use of anticoagulants: Secondary | ICD-10-CM | POA: Insufficient documentation

## 2017-04-15 DIAGNOSIS — Y838 Other surgical procedures as the cause of abnormal reaction of the patient, or of later complication, without mention of misadventure at the time of the procedure: Secondary | ICD-10-CM | POA: Insufficient documentation

## 2017-04-15 DIAGNOSIS — T8482XA Fibrosis due to internal orthopedic prosthetic devices, implants and grafts, initial encounter: Secondary | ICD-10-CM | POA: Diagnosis not present

## 2017-04-15 DIAGNOSIS — F419 Anxiety disorder, unspecified: Secondary | ICD-10-CM | POA: Insufficient documentation

## 2017-04-15 DIAGNOSIS — Z96652 Presence of left artificial knee joint: Secondary | ICD-10-CM | POA: Insufficient documentation

## 2017-04-15 DIAGNOSIS — Z791 Long term (current) use of non-steroidal anti-inflammatories (NSAID): Secondary | ICD-10-CM | POA: Insufficient documentation

## 2017-04-15 DIAGNOSIS — Z87891 Personal history of nicotine dependence: Secondary | ICD-10-CM | POA: Insufficient documentation

## 2017-04-15 DIAGNOSIS — F329 Major depressive disorder, single episode, unspecified: Secondary | ICD-10-CM | POA: Insufficient documentation

## 2017-04-15 DIAGNOSIS — Z79899 Other long term (current) drug therapy: Secondary | ICD-10-CM | POA: Insufficient documentation

## 2017-04-15 DIAGNOSIS — I10 Essential (primary) hypertension: Secondary | ICD-10-CM | POA: Insufficient documentation

## 2017-04-15 HISTORY — PX: KNEE CLOSED REDUCTION: SHX995

## 2017-04-15 SURGERY — MANIPULATION, KNEE, CLOSED
Anesthesia: General | Site: Knee | Laterality: Left

## 2017-04-15 MED ORDER — FENTANYL CITRATE (PF) 100 MCG/2ML IJ SOLN
INTRAMUSCULAR | Status: DC | PRN
  Administered 2017-04-15: 100 ug via INTRAVENOUS

## 2017-04-15 MED ORDER — FENTANYL CITRATE (PF) 100 MCG/2ML IJ SOLN
INTRAMUSCULAR | Status: AC
  Filled 2017-04-15: qty 2

## 2017-04-15 MED ORDER — LIDOCAINE HCL (CARDIAC) 20 MG/ML IV SOLN
INTRAVENOUS | Status: DC | PRN
  Administered 2017-04-15: 80 mg via INTRAVENOUS

## 2017-04-15 MED ORDER — MIDAZOLAM HCL 2 MG/2ML IJ SOLN
INTRAMUSCULAR | Status: DC | PRN
Start: 2017-04-15 — End: 2017-04-15
  Administered 2017-04-15: 2 mg via INTRAVENOUS

## 2017-04-15 MED ORDER — ACETAMINOPHEN 10 MG/ML IV SOLN
INTRAVENOUS | Status: DC | PRN
  Administered 2017-04-15: 1000 mg via INTRAVENOUS

## 2017-04-15 MED ORDER — SUCCINYLCHOLINE CHLORIDE 20 MG/ML IJ SOLN
INTRAMUSCULAR | Status: AC
  Filled 2017-04-15: qty 1

## 2017-04-15 MED ORDER — OXYCODONE HCL 5 MG PO TABS
5.0000 mg | ORAL_TABLET | Freq: Once | ORAL | Status: AC | PRN
  Administered 2017-04-15: 5 mg via ORAL

## 2017-04-15 MED ORDER — LIDOCAINE HCL (PF) 2 % IJ SOLN
INTRAMUSCULAR | Status: AC
  Filled 2017-04-15: qty 2

## 2017-04-15 MED ORDER — OXYCODONE HCL 5 MG/5ML PO SOLN: 5.0000 mg | Freq: Once | ORAL | Status: AC | PRN

## 2017-04-15 MED ORDER — FENTANYL CITRATE (PF) 100 MCG/2ML IJ SOLN
25.0000 ug | INTRAMUSCULAR | Status: AC | PRN
  Administered 2017-04-15 (×4): 25 ug via INTRAVENOUS

## 2017-04-15 MED ORDER — LACTATED RINGERS IV SOLN
INTRAVENOUS | Status: DC
  Administered 2017-04-15: 12:00:00 via INTRAVENOUS

## 2017-04-15 MED ORDER — GLYCOPYRROLATE 0.2 MG/ML IJ SOLN
INTRAMUSCULAR | Status: AC
  Filled 2017-04-15: qty 1

## 2017-04-15 MED ORDER — SEVOFLURANE IN SOLN
RESPIRATORY_TRACT | Status: AC
  Filled 2017-04-15: qty 250

## 2017-04-15 MED ORDER — PROPOFOL 10 MG/ML IV BOLUS
INTRAVENOUS | Status: DC | PRN
  Administered 2017-04-15: 200 mg via INTRAVENOUS

## 2017-04-15 MED ORDER — OXYCODONE HCL 5 MG PO TABS
ORAL_TABLET | ORAL | Status: AC
  Filled 2017-04-15: qty 1

## 2017-04-15 MED ORDER — FENTANYL CITRATE (PF) 100 MCG/2ML IJ SOLN
25.0000 ug | INTRAMUSCULAR | Status: DC | PRN
Start: 2017-04-15 — End: 2017-04-15
  Administered 2017-04-15 (×3): 50 ug via INTRAVENOUS

## 2017-04-15 MED ORDER — DEXAMETHASONE SODIUM PHOSPHATE 10 MG/ML IJ SOLN
INTRAMUSCULAR | Status: DC | PRN
  Administered 2017-04-15: 10 mg via INTRAVENOUS

## 2017-04-15 MED ORDER — MIDAZOLAM HCL 2 MG/2ML IJ SOLN
INTRAMUSCULAR | Status: AC
  Filled 2017-04-15: qty 2

## 2017-04-15 MED ORDER — PROMETHAZINE HCL 25 MG/ML IJ SOLN: 6.2500 mg | INTRAMUSCULAR | Status: DC | PRN

## 2017-04-15 MED ORDER — ONDANSETRON HCL 4 MG/2ML IJ SOLN
INTRAMUSCULAR | Status: AC
  Filled 2017-04-15: qty 2

## 2017-04-15 MED ORDER — FENTANYL CITRATE (PF) 100 MCG/2ML IJ SOLN
INTRAMUSCULAR | Status: AC
  Administered 2017-04-15: 25 ug via INTRAVENOUS
  Filled 2017-04-15: qty 2

## 2017-04-15 MED ORDER — PROPOFOL 10 MG/ML IV BOLUS
INTRAVENOUS | Status: AC
  Filled 2017-04-15: qty 20

## 2017-04-15 MED ORDER — ONDANSETRON HCL 4 MG/2ML IJ SOLN
INTRAMUSCULAR | Status: DC | PRN
  Administered 2017-04-15: 4 mg via INTRAVENOUS

## 2017-04-15 MED ORDER — ACETAMINOPHEN 10 MG/ML IV SOLN
INTRAVENOUS | Status: AC
  Filled 2017-04-15: qty 100

## 2017-04-15 MED ORDER — DEXAMETHASONE SODIUM PHOSPHATE 10 MG/ML IJ SOLN
INTRAMUSCULAR | Status: AC
  Filled 2017-04-15: qty 1

## 2017-04-15 MED ORDER — MEPERIDINE HCL 50 MG/ML IJ SOLN: 6.2500 mg | INTRAMUSCULAR | Status: DC | PRN

## 2017-04-15 SURGICAL SUPPLY — 1 items: KIT RM TURNOVER STRD PROC AR (KITS) ×3 IMPLANT

## 2017-04-15 NOTE — Op Note (Signed)
04/15/2017  1:32 PM  PATIENT:  Caitlyn [name]  51 y.o. female  PRE-OPERATIVE DIAGNOSIS:  ARTHROFIBROSIS OF TOTAL KNEE  POST-OPERATIVE DIAGNOSIS:  ARTHROFIBROSIS OF TOTAL KNEE  PROCEDURE:  Procedure(s): CLOSED MANIPULATION KNEE (Left)  SURGEON: Laurene Footman, MD  ASSISTANTS: None  ANESTHESIA:   general  EBL:  No intake/output data recorded.  BLOOD ADMINISTERED:none  DRAINS: none   LOCAL MEDICATIONS USED:  NONE  SPECIMEN:  No Specimen  DISPOSITION OF SPECIMEN:  N/A  COUNTS:  NO None taken closed procedure with nothing opened for the case  TOURNIQUET:  * No tourniquets in log *  IMPLANTS: None  DICTATION: .Dragon Dictation patient brought the operating room and after adequate anesthesia was given appropriate patient education timeout procedures were completed. Knee was brought to flexion only 60 of flexion was present with gentle pressure on the proximal tibia palpable popping was felt and flexion was slowly brought back to 115 with full extension also obtained after this was completed patient center comes stable condition  PLAN OF CARE: Discharge to home after PACU  PATIENT DISPOSITION:  PACU - hemodynamically stable.

## 2017-04-15 NOTE — Anesthesia Procedure Notes (Signed)
Procedure Name: LMA Insertion Date/Time: 04/15/2017 1:24 PM Performed by: Johnna Acosta Pre-anesthesia Checklist: Patient identified, Emergency Drugs available, Suction available, Patient being monitored and Timeout performed Patient Re-evaluated:Patient Re-evaluated prior to induction Oxygen Delivery Method: Circle system utilized Preoxygenation: Pre-oxygenation with 100% oxygen Induction Type: IV induction LMA: LMA inserted LMA Size: 4.0 Tube type: Oral Number of attempts: 1 Placement Confirmation: positive ETCO2 and breath sounds checked- equal and bilateral Tube secured with: Tape

## 2017-04-15 NOTE — H&P (Signed)
Reviewed paper H+P, will be scanned into chart. No changes noted.  

## 2017-04-15 NOTE — Progress Notes (Signed)
Pedal pulse strong in left foot

## 2017-04-15 NOTE — Anesthesia Postprocedure Evaluation (Signed)
Anesthesia Post Note  Patient: Caitlyn [name]  Procedure(s) Performed: Procedure(s) (LRB): CLOSED MANIPULATION KNEE (Left)  Patient location during evaluation: PACU Anesthesia Type: General Level of consciousness: awake and alert and oriented Pain management: pain level controlled Vital Signs Assessment: post-procedure vital signs reviewed and stable Respiratory status: spontaneous breathing, nonlabored ventilation and respiratory function stable Cardiovascular status: blood pressure returned to baseline and stable Postop Assessment: no signs of nausea or vomiting Anesthetic complications: no     Last Vitals:  Vitals:   04/15/17 1439 04/15/17 1457  BP: (!) 178/103 (!) 187/89  Pulse: 68 71  Resp: 19 16  Temp:    SpO2: 97% 96%    Last Pain:  Vitals:   04/15/17 1457  TempSrc:   PainSc: 5                  Knoah Nedeau

## 2017-04-15 NOTE — Transfer of Care (Signed)
Immediate Anesthesia Transfer of Care Note  Patient: Caitlyn [name]  Procedure(s) Performed: Procedure(s): CLOSED MANIPULATION KNEE (Left)  Patient Location: PACU  Anesthesia Type:General  Level of Consciousness: awake and alert   Airway & Oxygen Therapy: Patient Spontanous Breathing and Patient connected to face mask oxygen  Post-op Assessment: Report given to RN and Post -op Vital signs reviewed and stable  Post vital signs: Reviewed and stable  Last Vitals:  Vitals:   04/15/17 1024  BP: (!) 146/93  Pulse: 80  Resp: 16  Temp: (!) 36.3 C  SpO2: 100%    Last Pain:  Vitals:   04/15/17 1024  TempSrc: Tympanic  PainSc: 6          Complications: No apparent anesthesia complications

## 2017-04-15 NOTE — Anesthesia Post-op Follow-up Note (Signed)
Anesthesia QCDR form completed.        

## 2017-04-15 NOTE — Discharge Instructions (Signed)
Patient with physical therapy tomorrow, start working on motion today. Pain medicine as previously directed.

## 2017-04-15 NOTE — Anesthesia Preprocedure Evaluation (Signed)
Anesthesia Evaluation  Patient identified by MRN, date of birth, ID band Patient awake    Reviewed: Allergy & Precautions, NPO status , Patient's Chart, lab work & pertinent test results  History of Anesthesia Complications Negative for: history of anesthetic complications  Airway Mallampati: II  TM Distance: >3 FB Neck ROM: Full    Dental no notable dental hx.    Pulmonary neg sleep apnea, neg COPD, former smoker,    breath sounds clear to auscultation- rhonchi (-) wheezing      Cardiovascular Exercise Tolerance: Good hypertension, Pt. on medications (-) CAD, (-) Past MI and (-) Cardiac Stents  Rhythm:Regular Rate:Normal - Systolic murmurs and - Diastolic murmurs    Neuro/Psych PSYCHIATRIC DISORDERS Anxiety Depression negative neurological ROS     GI/Hepatic Neg liver ROS, GERD  ,  Endo/Other  negative endocrine ROSneg diabetes  Renal/GU negative Renal ROS     Musculoskeletal  (+) Arthritis ,   Abdominal (+) - obese,   Peds  Hematology  (+) anemia ,   Anesthesia Other Findings Past Medical History: No date: Anemia No date: Anxiety No date: Arthritis No date: Depression No date: GERD (gastroesophageal reflux disease) No date: Heart burn No date: Hypertension   Reproductive/Obstetrics                             Anesthesia Physical Anesthesia Plan  ASA: II  Anesthesia Plan: General   Post-op Pain Management:    Induction: Intravenous  PONV Risk Score and Plan: 2 and Ondansetron and Dexamethasone  Airway Management Planned: LMA  Additional Equipment:   Intra-op Plan:   Post-operative Plan:   Informed Consent: I have reviewed the patients History and Physical, chart, labs and discussed the procedure including the risks, benefits and alternatives for the proposed anesthesia with the patient or authorized representative who has indicated his/her understanding and  acceptance.   Dental advisory given  Plan Discussed with: CRNA and Anesthesiologist  Anesthesia Plan Comments:         Anesthesia Quick Evaluation

## 2017-04-15 NOTE — Progress Notes (Signed)
Steri strips covering wound to left knee

## 2017-04-16 ENCOUNTER — Encounter: Payer: Self-pay | Admitting: Orthopedic Surgery

## 2017-05-12 DIAGNOSIS — M791 Myalgia, unspecified site: Secondary | ICD-10-CM | POA: Insufficient documentation

## 2017-05-27 ENCOUNTER — Emergency Department: Payer: BLUE CROSS/BLUE SHIELD

## 2017-05-27 ENCOUNTER — Encounter: Payer: Self-pay | Admitting: Emergency Medicine

## 2017-05-27 ENCOUNTER — Emergency Department
Admission: EM | Admit: 2017-05-27 | Discharge: 2017-05-27 | Disposition: A | Discharge status: Home / Self Care | Payer: BLUE CROSS/BLUE SHIELD | Attending: Student in an Organized Health Care Education/Training Program | Admitting: Student in an Organized Health Care Education/Training Program

## 2017-05-27 DIAGNOSIS — R079 Chest pain, unspecified: Secondary | ICD-10-CM | POA: Diagnosis not present

## 2017-05-27 DIAGNOSIS — Z87891 Personal history of nicotine dependence: Secondary | ICD-10-CM | POA: Diagnosis not present

## 2017-05-27 DIAGNOSIS — Z7982 Long term (current) use of aspirin: Secondary | ICD-10-CM | POA: Diagnosis not present

## 2017-05-27 DIAGNOSIS — I10 Essential (primary) hypertension: Secondary | ICD-10-CM | POA: Diagnosis not present

## 2017-05-27 DIAGNOSIS — Z79899 Other long term (current) drug therapy: Secondary | ICD-10-CM | POA: Insufficient documentation

## 2017-05-27 DIAGNOSIS — R42 Dizziness and giddiness: Secondary | ICD-10-CM | POA: Insufficient documentation

## 2017-05-27 DIAGNOSIS — R519 Headache, unspecified: Secondary | ICD-10-CM

## 2017-05-27 DIAGNOSIS — R071 Chest pain on breathing: Secondary | ICD-10-CM | POA: Insufficient documentation

## 2017-05-27 DIAGNOSIS — R51 Headache: Secondary | ICD-10-CM

## 2017-05-27 LAB — BASIC METABOLIC PANEL
ANION GAP: 8 (ref 5–15)
BUN: 9 mg/dL (ref 6–20)
CALCIUM: 9.6 mg/dL (ref 8.9–10.3)
CO2: 28 mmol/L (ref 22–32)
CREATININE: 0.66 mg/dL (ref 0.44–1.00)
Chloride: 105 mmol/L (ref 101–111)
GLUCOSE: 104 mg/dL — AB (ref 65–99)
Potassium: 4.1 mmol/L (ref 3.5–5.1)
Sodium: 141 mmol/L (ref 135–145)

## 2017-05-27 LAB — CBC
HCT: 41.5 % (ref 35.0–47.0)
HEMOGLOBIN: 14.1 g/dL (ref 12.0–16.0)
MCH: 26.8 pg (ref 26.0–34.0)
MCHC: 33.9 g/dL (ref 32.0–36.0)
MCV: 78.9 fL — ABNORMAL LOW (ref 80.0–100.0)
PLATELETS: 276 10*3/uL (ref 150–440)
RBC: 5.26 MIL/uL — ABNORMAL HIGH (ref 3.80–5.20)
RDW: 14.5 % (ref 11.5–14.5)
WBC: 9.9 10*3/uL (ref 3.6–11.0)

## 2017-05-27 LAB — TROPONIN I

## 2017-05-27 MED ORDER — SODIUM CHLORIDE 0.9 % IV BOLUS (SEPSIS)
1000.0000 mL | Freq: Once | INTRAVENOUS | Status: AC
  Administered 2017-05-27: 1000 mL via INTRAVENOUS

## 2017-05-27 MED ORDER — PROCHLORPERAZINE EDISYLATE 5 MG/ML IJ SOLN
10.0000 mg | Freq: Once | INTRAMUSCULAR | Status: AC
  Administered 2017-05-27: 10 mg via INTRAVENOUS
  Filled 2017-05-27: qty 2

## 2017-05-27 MED ORDER — DIPHENHYDRAMINE HCL 50 MG/ML IJ SOLN
12.5000 mg | Freq: Once | INTRAMUSCULAR | Status: AC
  Administered 2017-05-27: 12.5 mg via INTRAVENOUS
  Filled 2017-05-27: qty 1

## 2017-05-27 MED ORDER — LORAZEPAM 1 MG PO TABS
1.0000 mg | ORAL_TABLET | Freq: Once | ORAL | Status: AC
  Administered 2017-05-27: 1 mg via ORAL
  Filled 2017-05-27: qty 1

## 2017-05-27 NOTE — ED Notes (Signed)
Waiting on translator.

## 2017-05-27 NOTE — ED Notes (Signed)
Per interpreter, pt c/o high blood sugar, "179" at doctor's office. Headache X 3 days. Pt alert and oriented X4, active, cooperative, pt in NAD. RR even and unlabored, color WNL.

## 2017-05-27 NOTE — ED Provider Notes (Signed)
[facility] Emergency Department Provider Note    First MD Initiated Contact with Patient 05/27/17 1419     (approximate)  I have reviewed the triage vital signs and the nursing notes.   HISTORY  Chief Complaint Chest Pain and Headache    HPI Monnie Sleeper is a 51 y.o. female with a history of anxiety, depression, GERD and hypertension presents with chief complaint of chest pain headache and dizziness that is been ongoing for the past week. Denies any fevers. No cough. States that she does get very lightheaded and short of breath since he was on this chest pain. Denies any recent stressors. Has taken Tylenol for the headache with improvement in the headache. Headache was not sudden in onset. No neck pain. No numbness or tingling. No history of heart attack or stroke.   Past Medical History:  Diagnosis Date  . Anemia   . Anxiety   . Arthritis   . Depression   . GERD (gastroesophageal reflux disease)   . Heart burn   . Hypertension    Family History  Problem Relation Age of Onset  . Osteoarthritis Mother   . Heart failure Father   . Diabetes Father   . Hypertension Father   . Osteoarthritis Sister    Past Surgical History:  Procedure Laterality Date  . ABDOMINAL HYSTERECTOMY Left 01/18/2017   Procedure: HYSTERECTOMY ABDOMINAL WITH LEFT SALPINGO OOPHERECTOMY;  Surgeon: Brayton Mars, MD;  Location: [facility] ORS;  Service: Gynecology;  Laterality: Left;  . CHOLECYSTECTOMY    . CHOLECYSTECTOMY, LAPAROSCOPIC    . JOINT REPLACEMENT    . KNEE ARTHROSCOPY Left 12/03/2016   Procedure: ARTHROSCOPY KNEE, partial medial menisectomy;  Surgeon: Hessie Knows, MD;  Location: [facility] ORS;  Service: Orthopedics;  Laterality: Left;  . KNEE CLOSED REDUCTION Left 04/15/2017   Procedure: CLOSED MANIPULATION KNEE;  Surgeon: Hessie Knows, MD;  Location: [facility] ORS;  Service: Orthopedics;  Laterality: Left;  . LAPAROSCOPIC OVARIAN CYSTECTOMY Right   .  MENISCECTOMY Right   . TOTAL KNEE ARTHROPLASTY Right 07/07/2016   Procedure: TOTAL KNEE ARTHROPLASTY;  Surgeon: Hessie Knows, MD;  Location: [facility] ORS;  Service: Orthopedics;  Laterality: Right;  . TOTAL KNEE ARTHROPLASTY Left 03/16/2017   Procedure: TOTAL KNEE ARTHROPLASTY;  Surgeon: Hessie Knows, MD;  Location: [facility] ORS;  Service: Orthopedics;  Laterality: Left;  . TUBAL LIGATION     Patient Active Problem List   Diagnosis Date Noted  . Primary localized osteoarthritis of left knee 03/16/2017  . Surgical menopause, symptomatic 01/26/2017  . Status post abdominal hysterectomy and left salpingo-oophorectomy 01/26/2017  . S/P TAH (total abdominal hysterectomy) 01/20/2017  . 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 Primary localized osteoarthritis of right knee 07/07/2016  . Abnormal hepatitis serology 04/10/2016  . Polyarthralgia 04/03/2016  . Anemia 10/23/2015      Prior to Admission medications   Medication Sig Start Date End Date Taking? Authorizing Provider  acetaminophen (TYLENOL) 325 MG tablet Take 650 mg by mouth every 6 (six) hours as needed.    [provider]  aspirin EC 325 MG tablet Take 162.5 mg by mouth daily.    [provider]  clindamycin (CLEOCIN) 300 MG capsule Take 300 mg by mouth 3 (three) times daily.    [provider]  docusate sodium (COLACE) 100 MG capsule Take 1-2 capsules (100-200 mg total) by mouth 2 (two) times daily as needed (for constipation). 01/20/17   Defrancesco, Alanda Slim, MD  enoxaparin (LOVENOX) 40 MG/0.4ML injection Inject 0.4  mLs (40 mg total) into the skin daily. Patient not taking: Reported on 04/12/2017 03/18/17   Reche Dixon, PA-C  ferrous sulfate 325 (65 FE) MG tablet Take 1 tablet (325 mg total) by mouth 2 (two) times daily with a meal. 01/20/17   Defrancesco, Alanda Slim, MD  ibuprofen (ADVIL,MOTRIN) 800 MG tablet Take 1 tablet (800 mg total) by mouth 3 (three) times daily. Patient taking differently: Take 800 mg by mouth as  needed.  01/20/17   Defrancesco, Alanda Slim, MD  lisinopril (PRINIVIL,ZESTRIL) 20 MG tablet Take 20 mg by mouth daily with breakfast.     [provider]  nystatin-triamcinolone ointment (MYCOLOG) Apply 1 application topically 2 (two) times daily. 02/24/17   Defrancesco, Alanda Slim, MD  omeprazole (PRILOSEC) 20 MG capsule Take 20 mg by mouth daily as needed (for stomach issues/heartburn/indigestion).     [provider]  oxyCODONE (OXY IR/ROXICODONE) 5 MG immediate release tablet Take 1-2 tablets (5-10 mg total) by mouth every 4 (four) hours as needed for breakthrough pain. 03/18/17   Reche Dixon, PA-C  sertraline (ZOLOFT) 50 MG tablet Take 50 mg by mouth daily at 2 PM.    [provider]  traMADol (ULTRAM) 50 MG tablet Take 1 tablet (50 mg total) by mouth as needed. 03/18/17   Reche Dixon, PA-C  zolpidem (AMBIEN) 10 MG tablet Take 10 mg by mouth at bedtime.     [provider]    Allergies Patient has no known allergies.    Social History Social History  Substance Use Topics  . Smoking status: Former Smoker    Packs/day: 0.50    Types: Cigarettes    Quit date: 03/01/2015  . Smokeless tobacco: Never Used  . Alcohol use 0.0 oz/week     Comment: rarely    Review of Systems Patient denies headaches, rhinorrhea, blurry vision, numbness, shortness of breath, chest pain, edema, cough, abdominal pain, nausea, vomiting, diarrhea, dysuria, fevers, rashes or hallucinations unless otherwise stated above in HPI. ____________________________________________   PHYSICAL EXAM:  VITAL SIGNS: Vitals:   05/27/17 1256  BP: (!) 183/101  Pulse: 63  Resp: 18  Temp: 99.1 F (37.3 C)  SpO2: 98%    Constitutional: Alert and oriented. Well appearing and in no acute distress. Eyes: Conjunctivae are normal.  Head: Atraumatic. Nose: No congestion/rhinnorhea. Mouth/Throat: Mucous membranes are moist.   Neck: No stridor. Painless ROM.  Cardiovascular: Normal rate, regular  rhythm. Grossly normal heart sounds.  Good peripheral circulation. Respiratory: Normal respiratory effort.  No retractions. Lungs CTAB. Gastrointestinal: Soft and nontender. No distention. No abdominal bruits. No CVA tenderness. Musculoskeletal: No lower extremity tenderness nor edema.  No joint effusions. Neurologic:  Normal speech and language. No gross focal neurologic deficits are appreciated. No facial droop Skin:  Skin is warm, dry and intact. No rash noted. Psychiatric: Mood and affect are normal. Speech and behavior are normal.  ____________________________________________   LABS (all labs ordered are listed, but only abnormal results are displayed)  Results for orders placed or performed during the hospital encounter of 05/27/17 (from the past 24 hour(s))  Basic metabolic panel     Status: Abnormal   Collection Time: 05/27/17  1:01 PM  Result Value Ref Range   Sodium 141 135 - 145 mmol/L   Potassium 4.1 3.5 - 5.1 mmol/L   Chloride 105 101 - 111 mmol/L   CO2 28 22 - 32 mmol/L   Glucose, Bld 104 (H) 65 - 99 mg/dL   BUN 9 6 -  20 mg/dL   Creatinine, Ser 0.66 0.44 - 1.00 mg/dL   Calcium 9.6 8.9 - 10.3 mg/dL   GFR calc non Af Amer >60 >60 mL/min   GFR calc Af Amer >60 >60 mL/min   Anion gap 8 5 - 15  CBC     Status: Abnormal   Collection Time: 05/27/17  1:01 PM  Result Value Ref Range   WBC 9.9 3.6 - 11.0 K/uL   RBC 5.26 (H) 3.80 - 5.20 MIL/uL   Hemoglobin 14.1 12.0 - 16.0 g/dL   HCT 41.5 35.0 - 47.0 %   MCV 78.9 (L) 80.0 - 100.0 fL   MCH 26.8 26.0 - 34.0 pg   MCHC 33.9 32.0 - 36.0 g/dL   RDW 14.5 11.5 - 14.5 %   Platelets 276 150 - 440 K/uL  Troponin I     Status: None   Collection Time: 05/27/17  1:01 PM  Result Value Ref Range   Troponin I <0.03 <0.03 ng/mL   ____________________________________________  EKG My review and personal interpretation at Time: 13:03   Indication: chest pain  Rate: 70  Rhythm: sinus Axis: normal Other: normal intervals, no stemi, no  depressions ____________________________________________  RADIOLOGY  I personally reviewed all radiographic images ordered to evaluate for the above acute complaints and reviewed radiology reports and findings.  These findings were personally discussed with the patient.  Please see medical record for radiology report.  ____________________________________________   PROCEDURES  Procedure(s) performed:  Procedures    Critical Care performed: no ____________________________________________   INITIAL IMPRESSION / ASSESSMENT AND PLAN / ED COURSE  Pertinent labs & imaging results that were available during my care of the patient were reviewed by me and considered in my medical decision making (see chart for details).  DDX: ACS, pericarditis, cva , migrain, anxiety, depression, dehydration   Caitlyn [name] is a 51 y.o. who presents to the ED with symptoms as described above. Patient well-appearing and in no acute distress. No focal neuro deficits but does have dizziness which could be manifestation of CVA. CT head ordered to evaluate for mass or bleed shows no acute abnormality. She is no focal deficits to suggest ischemic stroke. No evidence of ACS. No evidence of CHF exacerbation. Does appear slightly dehydrated. Blood sugar is mildly elevated in a doctor's office but improved upon arrival here. Patient with improvement in symptoms and headache after migraine cocktail. At this point do believe the patient is appropriate for follow-up as an outpatient with neurology and with her primary care physician further evaluation and management of her elevated blood pressure.  Patient was able to tolerate PO and was able to ambulate with a steady gait.  Have discussed with the patient and available family all diagnostics and treatments performed thus far and all questions were answered to the best of my ability. The patient demonstrates understanding and agreement with plan.        ____________________________________________   FINAL CLINICAL IMPRESSION(S) / ED DIAGNOSES  Final diagnoses:  Hypertension, unspecified type  Bad headache  Chest pain, unspecified type      NEW MEDICATIONS STARTED DURING THIS VISIT:  New Prescriptions   No medications on file     Note:  This document was prepared using Dragon voice recognition software and may include unintentional dictation errors.    Merlyn Lot, MD 05/27/17 1800

## 2017-05-27 NOTE — ED Triage Notes (Signed)
Pt reports left side chest pain that worsens with laying down, headache, nausea, dizziness and back pain for one week.

## 2017-05-27 NOTE — ED Notes (Signed)
Patient transported to CT 

## 2017-05-27 NOTE — ED Notes (Addendum)
Interpreter requested for discharge. PT eating in room without problems.

## 2017-05-27 NOTE — ED Notes (Signed)
Pt alert and oriented X4, active, cooperative, pt in NAD. RR even and unlabored, color WNL.  Pt family informed to return with patient if any life threatening symptoms occur. Discharge and followup instructions reviewed.

## 2017-05-27 NOTE — ED Notes (Addendum)
EDP aware of BP discharge BP. Pt take BP medications, hx of hypertension. Denies wanting interpreter for discharge.

## 2017-09-17 ENCOUNTER — Other Ambulatory Visit: Payer: Self-pay | Admitting: Gastroenterology

## 2017-09-17 DIAGNOSIS — K219 Gastro-esophageal reflux disease without esophagitis: Secondary | ICD-10-CM

## 2017-09-17 DIAGNOSIS — R1013 Epigastric pain: Secondary | ICD-10-CM

## 2017-09-23 ENCOUNTER — Ambulatory Visit
Admission: RE | Admit: 2017-09-23 | Discharge: 2017-09-23 | Disposition: A | Discharge status: Home / Self Care | Payer: BLUE CROSS/BLUE SHIELD | Source: Ambulatory Visit | Attending: Gastroenterology | Admitting: Gastroenterology

## 2017-09-23 DIAGNOSIS — K449 Diaphragmatic hernia without obstruction or gangrene: Secondary | ICD-10-CM | POA: Insufficient documentation

## 2017-09-23 DIAGNOSIS — K219 Gastro-esophageal reflux disease without esophagitis: Secondary | ICD-10-CM | POA: Diagnosis present

## 2017-09-23 DIAGNOSIS — R1310 Dysphagia, unspecified: Secondary | ICD-10-CM | POA: Insufficient documentation

## 2017-09-23 DIAGNOSIS — R1013 Epigastric pain: Secondary | ICD-10-CM

## 2017-10-20 ENCOUNTER — Other Ambulatory Visit: Payer: Self-pay

## 2017-10-20 ENCOUNTER — Encounter
Admission: RE | Admit: 2017-10-20 | Discharge: 2017-10-20 | Disposition: A | Discharge status: Home / Self Care | Payer: BLUE CROSS/BLUE SHIELD | Source: Ambulatory Visit | Attending: Orthopedic Surgery | Admitting: Orthopedic Surgery

## 2017-10-20 DIAGNOSIS — I10 Essential (primary) hypertension: Secondary | ICD-10-CM | POA: Diagnosis not present

## 2017-10-20 DIAGNOSIS — Z96653 Presence of artificial knee joint, bilateral: Secondary | ICD-10-CM | POA: Diagnosis not present

## 2017-10-20 DIAGNOSIS — M25362 Other instability, left knee: Secondary | ICD-10-CM | POA: Diagnosis present

## 2017-10-20 DIAGNOSIS — M659 Synovitis and tenosynovitis, unspecified: Secondary | ICD-10-CM | POA: Diagnosis not present

## 2017-10-20 DIAGNOSIS — F329 Major depressive disorder, single episode, unspecified: Secondary | ICD-10-CM | POA: Diagnosis not present

## 2017-10-20 DIAGNOSIS — Z79899 Other long term (current) drug therapy: Secondary | ICD-10-CM | POA: Diagnosis not present

## 2017-10-20 DIAGNOSIS — Z79891 Long term (current) use of opiate analgesic: Secondary | ICD-10-CM | POA: Diagnosis not present

## 2017-10-20 DIAGNOSIS — Z87891 Personal history of nicotine dependence: Secondary | ICD-10-CM | POA: Diagnosis not present

## 2017-10-20 DIAGNOSIS — F419 Anxiety disorder, unspecified: Secondary | ICD-10-CM | POA: Diagnosis not present

## 2017-10-20 DIAGNOSIS — K219 Gastro-esophageal reflux disease without esophagitis: Secondary | ICD-10-CM | POA: Diagnosis not present

## 2017-10-20 LAB — BASIC METABOLIC PANEL
ANION GAP: 9 (ref 5–15)
BUN: 12 mg/dL (ref 6–20)
CO2: 28 mmol/L (ref 22–32)
Calcium: 9.1 mg/dL (ref 8.9–10.3)
Chloride: 103 mmol/L (ref 101–111)
Creatinine, Ser: 0.67 mg/dL (ref 0.44–1.00)
GFR calc Af Amer: >60 mL/min (ref >60)
GFR calc non Af Amer: >60 mL/min (ref >60)
GLUCOSE: 93 mg/dL (ref 65–99)
POTASSIUM: 3.9 mmol/L (ref 3.5–5.1)
Sodium: 140 mmol/L (ref 135–145)

## 2017-10-20 LAB — CBC
HCT: 37.5 % (ref 35.0–47.0)
Hemoglobin: 12.5 g/dL (ref 12.0–16.0)
MCH: 27.2 pg (ref 26.0–34.0)
MCHC: 33.3 g/dL (ref 32.0–36.0)
MCV: 81.6 fL (ref 80.0–100.0)
Platelets: 245 10*3/uL (ref 150–440)
RBC: 4.59 MIL/uL (ref 3.80–5.20)
RDW: 13.2 % (ref 11.5–14.5)
WBC: 7.3 10*3/uL (ref 3.6–11.0)

## 2017-10-20 LAB — SURGICAL PCR SCREEN
MRSA, PCR: NEGATIVE
Staphylococcus aureus: NEGATIVE

## 2017-10-20 MED ORDER — CEFAZOLIN SODIUM-DEXTROSE 2-4 GM/100ML-% IV SOLN
2.0000 g | Freq: Once | INTRAVENOUS | Status: AC
  Administered 2017-10-21: 2 g via INTRAVENOUS

## 2017-10-20 NOTE — Patient Instructions (Addendum)
Your procedure is scheduled on: 10/21/17 Thurs Report to Same Day Surgery 2nd floor medical mall at 9:00 am   Remember: Instructions that are not followed completely may result in serious medical risk, up to and including death, or upon the discretion of your surgeon and anesthesiologist your surgery may need to be rescheduled.    _x___ 1. Do not eat food after midnight the night before your procedure. You may drink clear liquids up to 2 hours before you are scheduled to arrive at the hospital for your procedure.  Do not drink clear liquids within 2 hours of your scheduled arrival to the hospital.  Clear liquids include  --Water or Apple juice without pulp  --Clear carbohydrate beverage such as ClearFast or Gatorade  --Black Coffee or Clear Tea (No milk, no creamers, do not add anything to                  the coffee or Tea Type 1 and type 2 diabetics should only drink water.  No gum chewing or hard candies.     __x__ 2. No Alcohol for 24 hours before or after surgery.   __x__3. No Smoking or e-cigarettes for 24 prior to surgery.  Do not use any chewable tobacco products for at least 6 hour prior to surgery   ____  4. Bring all medications with you on the day of surgery if instructed.    __x__ 5. Notify your doctor if there is any change in your medical condition     (cold, fever, infections).    x___6. On the morning of surgery brush your teeth with toothpaste and water.  You may rinse your mouth with mouth wash if you wish.  Do not swallow any toothpaste or mouthwash.   Do not wear jewelry, make-up, hairpins, clips or nail polish.  Do not wear lotions, powders, or perfumes. You may wear deodorant.  Do not shave 48 hours prior to surgery. Men may shave face and neck.  Do not bring valuables to the hospital.    [facility] is not responsible for any belongings or valuables.               Contacts, dentures or bridgework may not be worn into surgery.  Leave your suitcase in the car.  After surgery it may be brought to your room.  For patients admitted to the hospital, discharge time is determined by your                       treatment team.  _  Patients discharged the day of surgery will not be allowed to drive home.  You will need someone to drive you home and stay with you the night of your procedure.    Please read over the following fact sheets that you were given:   [facility] Preparing for Surgery and or MRSA Information   _x___ Take anti-hypertensive listed below, cardiac, seizure, asthma,     anti-reflux and psychiatric medicines. These include:  1. pantoprazole (PROTONIX) 40 MG tablet   2.sertraline (ZOLOFT) 50 MG tablet  3.  4.  5.  6.  ____Fleets enema or Magnesium Citrate as directed.   _x___ Use CHG Soap or sage wipes as directed on instruction sheet   ____ Use inhalers on the day of surgery and bring to hospital day of surgery  ____ Stop Metformin and Janumet 2 days prior to surgery.    ____ Take 1/2 of usual insulin dose  the night before surgery and none on the morning     surgery.   _x___ Follow recommendations from Cardiologist, Pulmonologist or PCP regarding          stopping Aspirin, Coumadin, Plavix ,Eliquis, Effient, or Pradaxa, and Pletal.  X____Stop Anti-inflammatories such as Advil, Aleve, Ibuprofen, Motrin, Naproxen, Naprosyn, Goodies powders or aspirin products. OK to take Tylenol and                          Celebrex.   _x___ Stop supplements until after surgery.  But may continue Vitamin D, Vitamin B,       and multivitamin.   ____ Bring C-Pap to the hospital.   Your procedure is scheduled on:10/21/17 Thurs Su procedimiento est programado para: 2nd floor medical mall @ 9:00 am Presntese a:. Remember: Instructions that are no   __X_ 1.Do not eat food after midnight the night before your procedure. No   gum chewing or hard candies. You may drink clear liquids up to 2 hours   before you are scheduled to arrive for your  surgery- DO not drink clear   liquids within 2 hours of the start of your surgery.     Clear Liquids include:    water, apple juice without pulp, clear carbohydrate drink such as    Clearfast of [place]ade, Black Coffee or Tea (Do not add anything to   coffee or tea).      No coma nada despus de la medianoche de la noche anterior a su   procedimiento. No coma chicles ni caramelos duros. Puede tomar   lquidos claros hasta 2 horas antes de su hora programada de llegada al   hospital para su procedimiento. No tome lquidos claros durante el   transcurso de las 2 horas de su llegada programada al hospital para su   procedimiento, ya que esto puede llevar a que su procedimiento se   retrase o tenga que volver a Health and safety inspector.  Los lquidos claros incluyen:         - Agua o jugo de [place] sin pulpa         - Bebidas claras con carbohidratos como ClearFast o Gatorade         - Caf negro o t claro (sin leche, sin cremas, no agregue nada al caf ni al  t)  No tome nada que no est en esta lista.  Los pacientes con diabetes tipo 1 y tipo 2 solo deben Agricultural engineer.  Llame a la clnica de PreCare o a la unidad de Same Day Surgery si  tiene alguna pregunta sobre estas instrucciones.              _X__ 2.Do Not Smoke or use e-cigarettes For 24 Hours Prior to Your    Surgery.  Do not use any chewable tobacco products for at least 6   hours prior to surgery.    No fume ni use cigarrillos electrnicos durante las 24 horas previas   a su [place].  No use ningn producto de tabaco masticable durante  al menos 6 horas antes de la [place].     __X_ 3. No alcohol for 24 hours before or after surgery.    No tome alcohol durante las 24 horas antes ni despus de la [place].   ____4. Bring all medications with you on the day of surgery if instructed.    Lleve todos los medicamentos con usted el da de su ciruga si  se le   ha indicado as.   ____ 5. Notify your doctor if there is any change in your medical condition  (cold,   fever, infections).    Informe a su mdico si hay algn cambio en su condicin mdica   (resfriado, fiebre, infecciones).   Do not wear jewelry, make-up, hairpins, clips or nail polish.  No use joyas, maquillajes, pinzas/ganchos para el cabello ni esmalte de uas.  Do not wear lotions, powders, or perfumes. You may wear deodorant.  No use lociones, polvos o perfumes.  Puede usar desodorante.    Do not shave 48 hours prior to surgery. Men may shave face and neck.  No se afeite 48 horas antes de la [place].  Los hombres pueden [employer] cara  y el cuello.   Do not bring valuables to the hospital.   No lleve objetos [place] is not responsible for any belongings or valuables.  [place] no se hace responsable de ningn tipo de pertenencias u objetos de Geographical information systems officer.               Contacts, dentures or bridgework may not be worn into surgery.  Los lentes de [place], las dentaduras postizas o puentes no se pueden usar en la [place].  Leave your suitcase in the car. After surgery it may be brought to your room.  Deje su maleta en el auto.  Despus de la ciruga podr traerla a su habitacin.  For patients admitted to the hospital, discharge time is determined by your treatment team.  Para los pacientes que sean ingresados al hospital, el tiempo en el cual se le dar de alta es determinado por su equipo de [place].   Patients discharged the day of surgery will not be allowed to drive home. A los pacientes que se les da de alta el mismo da de la ciruga no se les permitir conducir a Holiday representative.   Please read over the following fact sheets that you were given: Por favor [place] hojas de informacin que le dieron:      ____ Take these medicines the morning of surgery with A SIP OF WATER:          [employer] medicinas la maana de la ciruga con UN SORBO DE AGUA:  1.pantoprazole (PROTONIX) 40 MG tablet  2. sertraline (ZOLOFT) 50 MG tablet  3.   4.        5.  6.  ____ Fleet Enema (as directed)          Enema de Fleet (segn lo indicado)    __x__ Use CHG Soap as directed          Utilice el jabn de CHG segn lo indicado  ____ Use inhalers on the day of surgery          Use los inhaladores el da de la ciruga  ____ Stop metformin 2 days prior to surgery          Deje de tomar el metformin 2 das antes de la ciruga    ____ Take 1/2 of usual insulin dose the night before surgery and none on the morning of surgery           Tome la mitad de la dosis habitual de insulina la noche antes de la [place] y no tome nada en la maana de la             ciruga  ____ Stop Coumadin/Plavix/aspirin on  Deje de tomar el Coumadin/Plavix/aspirina el da:  ____ Stop Anti-inflammatories on          Deje de tomar antiinflamatorios el da:   ____ Stop supplements until after surgery            Deje de tomar suplementos hasta despus de la ciruga  ____ Bring C-Pap to the hospital          [place] al hospital

## 2017-10-20 NOTE — Pre-Procedure Instructions (Deleted)
Instructed regarding incentive spirometry. 

## 2017-10-21 ENCOUNTER — Encounter
Admission: RE | Disposition: A | Discharge status: Home / Self Care | Payer: Self-pay | Source: Ambulatory Visit | Attending: Orthopedic Surgery

## 2017-10-21 ENCOUNTER — Ambulatory Visit: Payer: BLUE CROSS/BLUE SHIELD | Admitting: Anesthesiology

## 2017-10-21 ENCOUNTER — Other Ambulatory Visit: Payer: Self-pay

## 2017-10-21 ENCOUNTER — Observation Stay
Admission: RE | Admit: 2017-10-21 | Discharge: 2017-10-22 | Disposition: A | Discharge status: Home / Self Care | Payer: BLUE CROSS/BLUE SHIELD | Source: Ambulatory Visit | Attending: Orthopedic Surgery | Admitting: Orthopedic Surgery

## 2017-10-21 ENCOUNTER — Encounter: Payer: Self-pay | Admitting: Anesthesiology

## 2017-10-21 DIAGNOSIS — Z79899 Other long term (current) drug therapy: Secondary | ICD-10-CM | POA: Insufficient documentation

## 2017-10-21 DIAGNOSIS — F329 Major depressive disorder, single episode, unspecified: Secondary | ICD-10-CM | POA: Insufficient documentation

## 2017-10-21 DIAGNOSIS — F419 Anxiety disorder, unspecified: Secondary | ICD-10-CM | POA: Insufficient documentation

## 2017-10-21 DIAGNOSIS — M65962 Unspecified synovitis and tenosynovitis, left lower leg: Secondary | ICD-10-CM | POA: Diagnosis present

## 2017-10-21 DIAGNOSIS — M25362 Other instability, left knee: Secondary | ICD-10-CM | POA: Diagnosis not present

## 2017-10-21 DIAGNOSIS — Z96652 Presence of left artificial knee joint: Secondary | ICD-10-CM

## 2017-10-21 DIAGNOSIS — I10 Essential (primary) hypertension: Secondary | ICD-10-CM | POA: Insufficient documentation

## 2017-10-21 DIAGNOSIS — Z96653 Presence of artificial knee joint, bilateral: Secondary | ICD-10-CM | POA: Insufficient documentation

## 2017-10-21 DIAGNOSIS — K219 Gastro-esophageal reflux disease without esophagitis: Secondary | ICD-10-CM | POA: Insufficient documentation

## 2017-10-21 DIAGNOSIS — M659 Synovitis and tenosynovitis, unspecified: Secondary | ICD-10-CM | POA: Insufficient documentation

## 2017-10-21 DIAGNOSIS — Z87891 Personal history of nicotine dependence: Secondary | ICD-10-CM | POA: Insufficient documentation

## 2017-10-21 DIAGNOSIS — Z79891 Long term (current) use of opiate analgesic: Secondary | ICD-10-CM | POA: Insufficient documentation

## 2017-10-21 HISTORY — PX: KNEE ARTHROTOMY: SHX5881

## 2017-10-21 SURGERY — ARTHROTOMY, KNEE
Anesthesia: Spinal | Laterality: Left

## 2017-10-21 MED ORDER — BUPIVACAINE IN DEXTROSE 0.75-8.25 % IT SOLN
INTRATHECAL | Status: DC | PRN
  Administered 2017-10-21: 3 mL via INTRATHECAL

## 2017-10-21 MED ORDER — ONDANSETRON HCL 4 MG PO TABS: 4.0000 mg | ORAL_TABLET | Freq: Four times a day (QID) | ORAL | Status: DC | PRN

## 2017-10-21 MED ORDER — METHOCARBAMOL 1000 MG/10ML IJ SOLN
500.0000 mg | Freq: Four times a day (QID) | INTRAMUSCULAR | Status: DC | PRN
  Filled 2017-10-21: qty 5

## 2017-10-21 MED ORDER — METOCLOPRAMIDE HCL 10 MG PO TABS: 5.0000 mg | ORAL_TABLET | Freq: Three times a day (TID) | ORAL | Status: DC | PRN

## 2017-10-21 MED ORDER — MIDAZOLAM HCL 2 MG/2ML IJ SOLN
INTRAMUSCULAR | Status: AC
  Filled 2017-10-21: qty 2

## 2017-10-21 MED ORDER — ZOLPIDEM TARTRATE 5 MG PO TABS
10.0000 mg | ORAL_TABLET | Freq: Every evening | ORAL | Status: DC | PRN
  Administered 2017-10-21: 10 mg via ORAL
  Filled 2017-10-21: qty 2

## 2017-10-21 MED ORDER — CEFAZOLIN SODIUM-DEXTROSE 2-4 GM/100ML-% IV SOLN
INTRAVENOUS | Status: AC
  Filled 2017-10-21: qty 100

## 2017-10-21 MED ORDER — NEOMYCIN-POLYMYXIN B GU 40-200000 IR SOLN
Status: DC | PRN
  Administered 2017-10-21: 16 mL

## 2017-10-21 MED ORDER — PROPOFOL 10 MG/ML IV BOLUS
INTRAVENOUS | Status: DC | PRN
  Administered 2017-10-21: 40 mg via INTRAVENOUS

## 2017-10-21 MED ORDER — ONDANSETRON HCL 4 MG/2ML IJ SOLN
INTRAMUSCULAR | Status: DC | PRN
  Administered 2017-10-21: 4 mg via INTRAVENOUS

## 2017-10-21 MED ORDER — SODIUM CHLORIDE 0.9 % IV SOLN
INTRAVENOUS | Status: DC
  Administered 2017-10-21: 14:00:00 via INTRAVENOUS

## 2017-10-21 MED ORDER — PROPOFOL 500 MG/50ML IV EMUL
INTRAVENOUS | Status: DC | PRN
  Administered 2017-10-21: 100 ug/kg/min via INTRAVENOUS

## 2017-10-21 MED ORDER — METOCLOPRAMIDE HCL 5 MG/ML IJ SOLN: 5.0000 mg | Freq: Three times a day (TID) | INTRAMUSCULAR | Status: DC | PRN

## 2017-10-21 MED ORDER — ONDANSETRON HCL 4 MG/2ML IJ SOLN: 4.0000 mg | Freq: Four times a day (QID) | INTRAMUSCULAR | Status: DC | PRN

## 2017-10-21 MED ORDER — BISACODYL 10 MG RE SUPP
10.0000 mg | Freq: Every day | RECTAL | Status: DC | PRN
Start: 2017-10-21 — End: 2017-10-22

## 2017-10-21 MED ORDER — BUPIVACAINE HCL (PF) 0.5 % IJ SOLN
INTRAMUSCULAR | Status: AC
  Filled 2017-10-21: qty 10

## 2017-10-21 MED ORDER — SODIUM CHLORIDE 0.9 % IV SOLN
INTRAVENOUS | Status: DC | PRN
  Administered 2017-10-21: 25 ug/min via INTRAVENOUS

## 2017-10-21 MED ORDER — FENTANYL CITRATE (PF) 100 MCG/2ML IJ SOLN: 25.0000 ug | INTRAMUSCULAR | Status: DC | PRN

## 2017-10-21 MED ORDER — ONDANSETRON HCL 4 MG/2ML IJ SOLN
4.0000 mg | Freq: Once | INTRAMUSCULAR | Status: DC | PRN
Start: 2017-10-21 — End: 2017-10-21

## 2017-10-21 MED ORDER — POLYETHYLENE GLYCOL 3350 17 G PO PACK
17.0000 g | PACK | Freq: Every day | ORAL | Status: DC | PRN
  Administered 2017-10-22: 17 g via ORAL
  Filled 2017-10-21: qty 1

## 2017-10-21 MED ORDER — OXYCODONE-ACETAMINOPHEN 5-325 MG PO TABS
1.0000 | ORAL_TABLET | ORAL | Status: DC | PRN
  Administered 2017-10-21: 1 via ORAL
  Administered 2017-10-21 – 2017-10-22 (×4): 2 via ORAL
  Filled 2017-10-21 (×3): qty 2
  Filled 2017-10-21: qty 1
  Filled 2017-10-21: qty 2

## 2017-10-21 MED ORDER — HYDROMORPHONE HCL 1 MG/ML IJ SOLN
0.5000 mg | INTRAMUSCULAR | Status: DC | PRN
  Administered 2017-10-21 (×2): 0.5 mg via INTRAVENOUS
  Filled 2017-10-21 (×2): qty 1

## 2017-10-21 MED ORDER — METHOCARBAMOL 500 MG PO TABS
500.0000 mg | ORAL_TABLET | Freq: Four times a day (QID) | ORAL | Status: DC | PRN
  Administered 2017-10-21: 500 mg via ORAL
  Filled 2017-10-21: qty 1

## 2017-10-21 MED ORDER — SODIUM CHLORIDE 0.9 % IV SOLN
INTRAVENOUS | Status: DC | PRN
  Administered 2017-10-21: 60 mL

## 2017-10-21 MED ORDER — MIDAZOLAM HCL 5 MG/5ML IJ SOLN
INTRAMUSCULAR | Status: DC | PRN
  Administered 2017-10-21: 2 mg via INTRAVENOUS

## 2017-10-21 MED ORDER — CEFAZOLIN SODIUM-DEXTROSE 2-4 GM/100ML-% IV SOLN
2.0000 g | Freq: Four times a day (QID) | INTRAVENOUS | Status: AC
  Administered 2017-10-21 – 2017-10-22 (×3): 2 g via INTRAVENOUS
  Filled 2017-10-21 (×3): qty 100

## 2017-10-21 MED ORDER — DOCUSATE SODIUM 100 MG PO CAPS
100.0000 mg | ORAL_CAPSULE | Freq: Two times a day (BID) | ORAL | Status: DC
  Administered 2017-10-21 – 2017-10-22 (×2): 100 mg via ORAL
  Filled 2017-10-21 (×2): qty 1

## 2017-10-21 MED ORDER — LACTATED RINGERS IV SOLN
INTRAVENOUS | Status: DC
  Administered 2017-10-21 (×2): via INTRAVENOUS

## 2017-10-21 SURGICAL SUPPLY — 39 items
BANDAGE ELASTIC 6 LF NS (GAUZE/BANDAGES/DRESSINGS) ×3 IMPLANT
CANISTER SUCT 1200ML W/VALVE (MISCELLANEOUS) ×3 IMPLANT
CANISTER SUCT 3000ML (MISCELLANEOUS) ×6 IMPLANT
CHLORAPREP W/TINT 26ML (MISCELLANEOUS) ×6 IMPLANT
CUFF TOURN 24 STER (MISCELLANEOUS) IMPLANT
CUFF TOURN 30 STER DUAL PORT (MISCELLANEOUS) ×3 IMPLANT
ELECT CAUTERY BLADE 6.4 (BLADE) ×3 IMPLANT
ELECT REM PT RETURN 9FT ADLT (ELECTROSURGICAL) ×3
ELECTRODE REM PT RTRN 9FT ADLT (ELECTROSURGICAL) ×1 IMPLANT
GAUZE PETRO XEROFOAM 1X8 (MISCELLANEOUS) ×3 IMPLANT
GAUZE SPONGE 4X4 12PLY STRL (GAUZE/BANDAGES/DRESSINGS) ×3 IMPLANT
GLOVE SURG SYN 9.0  PF PI (GLOVE) ×2
GLOVE SURG SYN 9.0 PF PI (GLOVE) ×1 IMPLANT
GOWN SRG 2XL LVL 4 RGLN SLV (GOWNS) ×1 IMPLANT
GOWN STRL NON-REIN 2XL LVL4 (GOWNS) ×2
GOWN STRL REUS W/ TWL LRG LVL3 (GOWN DISPOSABLE) ×1 IMPLANT
GOWN STRL REUS W/TWL LRG LVL3 (GOWN DISPOSABLE) ×2
IMMOB KNEE 24 THIGH 24 443303 (SOFTGOODS) IMPLANT
INSERT TIBIAL SZ2 LEFT (Insert) ×3 IMPLANT
KIT TURNOVER KIT A (KITS) ×3 IMPLANT
NS IRRIG 500ML POUR BTL (IV SOLUTION) ×3 IMPLANT
PACK TOTAL KNEE (MISCELLANEOUS) ×3 IMPLANT
PAD ABD DERMACEA PRESS 5X9 (GAUZE/BANDAGES/DRESSINGS) ×3 IMPLANT
SOL .9 NS 3000ML IRR  AL (IV SOLUTION) ×2
SOL .9 NS 3000ML IRR UROMATIC (IV SOLUTION) ×1 IMPLANT
STAPLER SKIN PROX 35W (STAPLE) ×3 IMPLANT
SUT DVC VLOC 90 3-0 CV23 VLT (SUTURE) ×3
SUT ETHIBOND 2 V 37 (SUTURE) ×3 IMPLANT
SUT QUILL 0 20X36 (SUTURE) ×3 IMPLANT
SUT QUILL PDO 0 36 36 VIOLET (SUTURE) ×3 IMPLANT
SUT TICRON 2-0 30IN 311381 (SUTURE) ×3 IMPLANT
SUT VIC AB 0 CT1 27 (SUTURE)
SUT VIC AB 0 CT1 27XCR 8 STRN (SUTURE) IMPLANT
SUT VIC AB 0 CT1 36 (SUTURE) IMPLANT
SUT VIC AB 2-0 CT1 27 (SUTURE)
SUT VIC AB 2-0 CT1 TAPERPNT 27 (SUTURE) IMPLANT
SUTURE DVC VLC 90 3-0 CV23 VLT (SUTURE) ×1 IMPLANT
SYR 10ML LL (SYRINGE) ×3 IMPLANT
SYS IRRIGATION SURGILAV PLUS (MISCELLANEOUS) ×3 IMPLANT

## 2017-10-21 NOTE — Anesthesia Post-op Follow-up Note (Signed)
Anesthesia QCDR form completed.        

## 2017-10-21 NOTE — Anesthesia Procedure Notes (Signed)
Spinal  Patient location during procedure: OR Start time: 10/21/2017 9:30 AM End time: 10/21/2017 9:30 AM Staffing Resident/CRNA: Nelda Marseille, CRNA Performed: resident/CRNA  Preanesthetic Checklist Completed: patient identified, site marked, surgical consent, pre-op evaluation, timeout performed, IV checked, risks and benefits discussed and monitors and equipment checked Spinal Block Patient position: sitting Prep: Betadine Patient monitoring: heart rate, continuous pulse ox, blood pressure and cardiac monitor Approach: midline Location: L3-4 Injection technique: single-shot Needle Needle type: [place] and Introducer  Needle gauge: 24 G Needle length: 9 cm Additional Notes Negative paresthesia. Negative blood return. Positive free-flowing CSF. Expiration date of kit checked and confirmed. Patient tolerated procedure well, without complications.

## 2017-10-21 NOTE — H&P (Signed)
Reviewed paper H+P, will be scanned into chart. No changes noted.  

## 2017-10-21 NOTE — Transfer of Care (Signed)
Immediate Anesthesia Transfer of Care Note  Patient: Caitlyn [name]  Procedure(s) Performed: KNEE ARTHROTOMY LEFT LATERAL RELEASE MEDIAL RETINACULART REPAIR POLY EXCHANGE (Left )  Patient Location: PACU  Anesthesia Type:Spinal  Level of Consciousness: sedated  Airway & Oxygen Therapy: Patient Spontanous Breathing and Patient connected to face mask oxygen  Post-op Assessment: Report given to RN and Post -op Vital signs reviewed and stable  Post vital signs: Reviewed and stable  Last Vitals:  Vitals:   10/21/17 0855  BP: (!) 168/91  Pulse: 88  Resp: 18  Temp: 36.6 C  SpO2: 99%    Last Pain:  Vitals:   10/21/17 0855  TempSrc: Temporal         Complications: No apparent anesthesia complications

## 2017-10-21 NOTE — Anesthesia Preprocedure Evaluation (Signed)
Anesthesia Evaluation  Patient identified by MRN, date of birth, ID band Patient awake    Reviewed: Allergy & Precautions, NPO status , Patient's Chart, lab work & pertinent test results, reviewed documented beta blocker date and time   Airway Mallampati: III  TM Distance: >3 FB     Dental  (+) Chipped   Pulmonary former smoker,           Cardiovascular hypertension, Pt. on medications      Neuro/Psych PSYCHIATRIC DISORDERS Anxiety Depression    GI/Hepatic   Endo/Other    Renal/GU      Musculoskeletal  (+) Arthritis ,   Abdominal   Peds  Hematology  (+) anemia ,   Anesthesia Other Findings   Reproductive/Obstetrics                             Anesthesia Physical Anesthesia Plan  ASA: III  Anesthesia Plan: Spinal   Post-op Pain Management:    Induction:   PONV Risk Score and Plan:   Airway Management Planned:   Additional Equipment:   Intra-op Plan:   Post-operative Plan:   Informed Consent: I have reviewed the patients History and Physical, chart, labs and discussed the procedure including the risks, benefits and alternatives for the proposed anesthesia with the patient or authorized representative who has indicated his/her understanding and acceptance.     Plan Discussed with: CRNA  Anesthesia Plan Comments:         Anesthesia Quick Evaluation

## 2017-10-21 NOTE — Op Note (Signed)
10/21/2017  10:42 AM  PATIENT:  Caitlyn [name]  52 y.o. female  PRE-OPERATIVE DIAGNOSIS:  status post total left knee replacement,patellar instability of left knee  POST-OPERATIVE DIAGNOSIS:  status post total left knee replacement,patellar instability of left knee, instability tibiofemoral joint  PROCEDURE:  Procedure(s): KNEE ARTHROTOMY LEFT LATERAL RELEASE MEDIAL RETINACULART REPAIR POLY EXCHANGE (Left)  SURGEON: Laurene Footman, MD  ASSISTANTS: Rachelle Hora PA-C  ANESTHESIA:   spinal  EBL:  Total I/O In: 1200 [I.V.:1200] Out: 550 [Urine:450; Blood:100]  BLOOD ADMINISTERED:none  DRAINS: none   LOCAL MEDICATIONS USED:  OTHER Exparel  SPECIMEN:  No Specimen  DISPOSITION OF SPECIMEN:  N/A  COUNTS:  YES  TOURNIQUET:   Total Tourniquet Time Documented: Thigh (Left) - 18 minutes Total: Thigh (Left) - 18 minutes   IMPLANTS: Medacta sized to 14 mm polyethylene insert left  DICTATION: .Dragon Dictation patient brought the operating room and after adequate anesthesia was obtained the left leg was prepped and draped in the sterile fashion.  After patient identification and timeout procedures were completed, tourniquet was raised and the prior scar was elliptically excised.  An arthrotomy was then performed and on inspection there is opening to the very to varus and valgus stress of the tibial femoral joint that was deemed significant there is also extensive synovitis and with cautery electrocautery the synovitis was excised in the medial and lateral gutters as well as the suprapatellar pouch.  The implants appears otherwise stable the patella did slightly track laterally and a lateral release was performed at this point.  The prior tibial insert was removed and a trial 14 mm placed this gave excellent stability with maintenance of full range of motion the knee was then injected with the local noted above followed by thorough irrigation and placement of the polyethylene insert  with the screw tightened with a torque screwdriver.  The arthrotomy was then repaired with a heavy [place]ilene medially followed by oversewing with heavy Quill.3-0 v-loc subcutaneous followed by skin staples and then Xeroform 4 x 4's ABD web roll and Ace wrap  PLAN OF CARE: Admit for overnight observation  PATIENT DISPOSITION:  PACU - hemodynamically stable.

## 2017-10-22 DIAGNOSIS — M25362 Other instability, left knee: Secondary | ICD-10-CM | POA: Diagnosis not present

## 2017-10-22 MED ORDER — ASPIRIN 325 MG PO TABS
325.0000 mg | ORAL_TABLET | Freq: Two times a day (BID) | ORAL | Status: DC
  Administered 2017-10-22: 325 mg via ORAL
  Filled 2017-10-22 (×2): qty 1

## 2017-10-22 MED ORDER — MENTHOL 3 MG MT LOZG
1.0000 | LOZENGE | OROMUCOSAL | Status: DC | PRN
  Administered 2017-10-22: 3 mg via ORAL
  Filled 2017-10-22: qty 9

## 2017-10-22 MED ORDER — ASPIRIN 325 MG PO TABS: 325.0000 mg | ORAL_TABLET | Freq: Two times a day (BID) | ORAL | 0 refills | Status: DC

## 2017-10-22 MED ORDER — KETOROLAC TROMETHAMINE 15 MG/ML IJ SOLN
15.0000 mg | Freq: Once | INTRAMUSCULAR | Status: AC
  Administered 2017-10-22: 15 mg via INTRAVENOUS
  Filled 2017-10-22: qty 1

## 2017-10-22 MED ORDER — OXYCODONE-ACETAMINOPHEN 5-325 MG PO TABS: 1.0000 | ORAL_TABLET | ORAL | 0 refills | Status: DC | PRN

## 2017-10-22 NOTE — Discharge Instructions (Signed)

## 2017-10-22 NOTE — Discharge Summary (Signed)
Physician Discharge Summary  Patient ID: Caitlyn [name] MRN: [id number] DOB/AGE: [birth year] 52 y.o.  Admit date: 10/21/2017 Discharge date: 10/22/2017  Admission Diagnoses:  status post total left knee replacement,patellar instability of left knee   Discharge Diagnoses: Patient Active Problem List   Diagnosis Date Noted  . Synovitis of left knee 10/21/2017  . Primary localized osteoarthritis of left knee 03/16/2017  . Surgical menopause, symptomatic 01/26/2017  . Status post abdominal hysterectomy and left salpingo-oophorectomy 01/26/2017  . S/P TAH (total abdominal hysterectomy) 01/20/2017  . 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 Primary localized osteoarthritis of right knee 07/07/2016  . Abnormal hepatitis serology 04/10/2016  . Polyarthralgia 04/03/2016  . Anemia 10/23/2015    Past Medical History:  Diagnosis Date  . Anemia   . Anxiety   . Arthritis   . Depression   . GERD (gastroesophageal reflux disease)   . Heart burn   . Hypertension      Transfusion: none   Consultants (if any):   Discharged Condition: Improved  Hospital Course: Caitlyn [name] is an 52 y.o. female who was admitted 10/21/2017 with a diagnosis of left total knee pain with instability and went to the operating room on 10/21/2017 and underwent the above named procedures.    Surgeries: Procedure(s): KNEE ARTHROTOMY LEFT LATERAL RELEASE MEDIAL RETINACULART REPAIR POLY EXCHANGE on 10/21/2017 Patient tolerated the surgery well. Taken to PACU where she was stabilized and then transferred to the orthopedic floor.  Started on aspirin 325 mg q 12 hrs. Foot pumps applied bilaterally at 80 mm. Heels elevated on bed with rolled towels. No evidence of DVT. Negative Homan. Physical therapy started on day #1 for gait training and transfer. OT started day #1 for ADL and assisted devices.  Patient's foley was d/c on day #1. Patient's IV  was d/c on day #1.  On post op day #1 patient was stable and ready  for discharge to home.  Implants:Medacta sized to 14 mm polyethylene insert left    She was given perioperative antibiotics:  Anti-infectives (From admission, onward)   Start     Dose/Rate Route Frequency Ordered Stop   10/21/17 1530  ceFAZolin (ANCEF) IVPB 2g/100 mL premix     2 g 200 mL/hr over 30 Minutes Intravenous Every 6 hours 10/21/17 1301 10/22/17 0417   10/21/17 1251  ceFAZolin (ANCEF) 2-4 GM/100ML-% IVPB    Comments:  Lyman Bishop   : cabinet override      10/21/17 1251 10/22/17 0059   10/20/17 2230  ceFAZolin (ANCEF) IVPB 2g/100 mL premix     2 g 200 mL/hr over 30 Minutes Intravenous  Once 10/20/17 2215 10/21/17 0950    .  She was given sequential compression devices, early ambulation, and Aspirin 325 mg BID for DVT prophylaxis.  She benefited maximally from the hospital stay and there were no complications.    Recent vital signs:  Vitals:   10/22/17 0339 10/22/17 0744  BP: 126/76 (!) 156/93  Pulse: 86 86  Resp: 16 20  Temp: 98.9 F (37.2 C) 98.2 F (36.8 C)  SpO2: 96% 100%    Recent laboratory studies:  Lab Results  Component Value Date   HGB 12.5 10/20/2017   HGB 14.1 05/27/2017   HGB 10.9 (L) 03/17/2017   Lab Results  Component Value Date   WBC 7.3 10/20/2017   PLT 245 10/20/2017   Lab Results  Component Value Date   INR 1.11 03/04/2017   Lab Results  Component Value Date   NA 140 10/20/2017  K 3.9 10/20/2017   CL 103 10/20/2017   CO2 28 10/20/2017   BUN 12 10/20/2017   CREATININE 0.67 10/20/2017   GLUCOSE 93 10/20/2017    Discharge Medications:   Allergies as of 10/22/2017   No Known Allergies     Medication List    TAKE these medications   aspirin 325 MG tablet Take 1 tablet (325 mg total) by mouth 2 (two) times daily.   lisinopril 20 MG tablet Commonly known as:  PRINIVIL,ZESTRIL Take 20 mg by mouth daily with breakfast.   oxyCODONE-acetaminophen 5-325 MG tablet Commonly known as:  PERCOCET/ROXICET Take 1-2 tablets  by mouth every 4 (four) hours as needed for moderate pain.   pantoprazole 40 MG tablet Commonly known as:  PROTONIX Take 40 mg by mouth 2 (two) times daily as needed.   sertraline 50 MG tablet Commonly known as:  ZOLOFT Take 50 mg by mouth daily.   tiZANidine 2 MG tablet Commonly known as:  ZANAFLEX Take 2 mg by mouth 3 times/day as needed-between meals & bedtime.   traMADol 50 MG tablet Commonly known as:  ULTRAM Take 1 tablet (50 mg total) by mouth as needed. What changed:    when to take this  reasons to take this   Vitamin D (Ergocalciferol) 50000 units Caps capsule Commonly known as:  DRISDOL Take 50,000 Units by mouth every 7 (seven) days.   zolpidem 10 MG tablet Commonly known as:  AMBIEN Take 10 mg by mouth at bedtime.       Diagnostic Studies: Dg Esophagus  Result Date: 09/23/2017 CLINICAL DATA:  History of reflux and indigestion symptoms for the past several years with increasing severity recently. Some improvement with Protonix. No history of dysphagia. Previous cholecystectomy. EXAM: ESOPHOGRAM / BARIUM SWALLOW / BARIUM TABLET STUDY TECHNIQUE: Combined double contrast and single contrast examination performed using effervescent crystals, thick barium liquid, and thin barium liquid. The patient was observed with fluoroscopy swallowing a 13 mm barium sulphate tablet. FLUOROSCOPY TIME:  Fluoroscopy Time:  0 minutes, 48 seconds Radiation Exposure Index (if provided by the fluoroscopic device): 448 micro Gy per meters square Number of Acquired Spot Images: 7+ 1 video loop COMPARISON:  None in PACs FINDINGS: The anticipated procedure was discussed with the patient through the interpreter. She voiced her willingness to proceed. The patient ingested thick and thin barium and the effervescent crystals without difficulty. The hypopharynx distended well. There was no laryngeal penetration of the barium. The cervical esophagus distended well. The cricopharyngeus muscle impression  was not increased. The thoracic esophagus distended well. There was a small reducible hiatal hernia. There was no evidence of stricture nor esophagitis. No reflux was observed. The barium tablet passed promptly from the mouth to the stomach. IMPRESSION: Small reducible hiatal hernia. No evidence of stricture or esophagitis. No reflux was observed. Electronically Signed   By: David  Martinique M.D.   On: 09/23/2017 08:22    Disposition: 01-Home or Self Care    Follow-up Information    Hessie Knows, MD Follow up in 2 week(s).   Specialty:  Orthopedic Surgery Contact information: [place] [address] [phone]            Signed: Dorise Hiss [facility] 10/22/2017, 11:43 AM

## 2017-10-22 NOTE — Anesthesia Postprocedure Evaluation (Signed)
Anesthesia Post Note  Patient: Caitlyn [name]  Procedure(s) Performed: KNEE ARTHROTOMY LEFT LATERAL RELEASE MEDIAL RETINACULART REPAIR POLY EXCHANGE (Left )  Patient location during evaluation: Nursing Unit Anesthesia Type: Spinal Level of consciousness: awake, awake and alert and oriented Pain management: pain level controlled Vital Signs Assessment: post-procedure vital signs reviewed and stable Respiratory status: spontaneous breathing and nonlabored ventilation Cardiovascular status: blood pressure returned to baseline Postop Assessment: no headache Anesthetic complications: no Comments: Spoke to patient with interpretor.  Moves both feet and legs, now feeling some (little) pain.  Has not yet emptied her bladder.     Last Vitals:  Vitals:   10/22/17 0339 10/22/17 0744  BP: 126/76 (!) 156/93  Pulse: 86 86  Resp: 16 20  Temp: 37.2 C 36.8 C  SpO2: 96% 100%    Last Pain:  Vitals:   10/22/17 0744  TempSrc: Oral  PainSc:                  Buckner [place]

## 2017-10-22 NOTE — Progress Notes (Signed)
Patient rates 9/10 after oral meds. MD Rudene Christians made aware and received verbal orders for Toradol 15 mg IV once. Order placed.

## 2017-10-22 NOTE — Progress Notes (Signed)
Discharge instructions and medication details reviewed with patient and family. Patient verbalizes understanding. Printed prescriptions and AVS given to patient. Patient is aware of follow up appointment.  Patient rates pain 3/10. IV removed. Patient was escorted out via wheelchair.

## 2017-10-22 NOTE — Progress Notes (Signed)
   Subjective: 1 Day Post-Op Procedure(s) (LRB): KNEE ARTHROTOMY LEFT LATERAL RELEASE MEDIAL RETINACULART REPAIR POLY EXCHANGE (Left) Patient reports pain as moderate.   Patient is well, and has had no acute complaints or problems Denies any CP, SOB, ABD pain. We will start therapy today.  Plan is to go Home after hospital stay.  Objective: Vital signs in last 24 hours: Temp:  [98.2 F (36.8 C)-100.1 F (37.8 C)] 98.2 F (36.8 C) (02/22 0744) Pulse Rate:  [62-95] 86 (02/22 0744) Resp:  [12-20] 20 (02/22 0744) BP: (103-162)/(61-93) 156/93 (02/22 0744) SpO2:  [96 %-100 %] 100 % (02/22 0744)  Intake/Output from previous day: 02/21 0701 - 02/22 0700 In: 4272.5 [P.O.:960; I.V.:3112.5; IV Piggyback:200] Out: 3100 [Urine:3000; Blood:100] Intake/Output this shift: Total I/O In: 480 [P.O.:480] Out: -   Recent Labs    10/20/17 0940  HGB 12.5   Recent Labs    10/20/17 0940  WBC 7.3  RBC 4.59  HCT 37.5  PLT 245   Recent Labs    10/20/17 0940  NA 140  K 3.9  CL 103  CO2 28  BUN 12  CREATININE 0.67  GLUCOSE 93  CALCIUM 9.1   No results for input(s): LABPT, INR in the last 72 hours.  EXAM General - Patient is Alert, Appropriate and Oriented Extremity - Neurovascular intact Sensation intact distally Intact pulses distally Dorsiflexion/Plantar flexion intact No cellulitis present Compartment soft Dressing - dressing C/D/I and no drainage. Honeycomb applied Motor Function - intact, moving foot and toes well on exam.   Past Medical History:  Diagnosis Date  . Anemia   . Anxiety   . Arthritis   . Depression   . GERD (gastroesophageal reflux disease)   . Heart burn   . Hypertension     Assessment/Plan:   1 Day Post-Op Procedure(s) (LRB): KNEE ARTHROTOMY LEFT LATERAL RELEASE MEDIAL RETINACULART REPAIR POLY EXCHANGE (Left) Active Problems:   Synovitis of left knee  Estimated body mass index is 30.73 kg/m as calculated from the following:   Height as of  this encounter: 5\' 4"  (1.626 m).   Weight as of this encounter: 81.2 kg (179 lb). Advance diet Up with therapy  Discharge home today with outpatient PT starting next week Follow up with [facility] in 2 weeks for wound check  DVT Prophylaxis - Aspirin, Foot Pumps and TED hose Weight-Bearing as tolerated to left leg   T. Rachelle Hora, PA-C [place] 10/22/2017, 11:35 AM

## 2017-10-27 DIAGNOSIS — G8929 Other chronic pain: Secondary | ICD-10-CM | POA: Insufficient documentation

## 2017-10-27 DIAGNOSIS — M7918 Myalgia, other site: Secondary | ICD-10-CM | POA: Insufficient documentation

## 2017-11-03 ENCOUNTER — Encounter: Payer: Self-pay | Admitting: *Deleted

## 2017-11-04 ENCOUNTER — Ambulatory Visit: Payer: BLUE CROSS/BLUE SHIELD | Admitting: Anesthesiology

## 2017-11-04 ENCOUNTER — Encounter: Payer: Self-pay | Admitting: *Deleted

## 2017-11-04 ENCOUNTER — Other Ambulatory Visit: Payer: Self-pay

## 2017-11-04 ENCOUNTER — Ambulatory Visit
Admission: RE | Admit: 2017-11-04 | Discharge: 2017-11-04 | Disposition: A | Discharge status: Home / Self Care | Payer: BLUE CROSS/BLUE SHIELD | Source: Ambulatory Visit | Attending: Gastroenterology | Admitting: Gastroenterology

## 2017-11-04 ENCOUNTER — Encounter
Admission: RE | Disposition: A | Discharge status: Home / Self Care | Payer: Self-pay | Source: Ambulatory Visit | Attending: Gastroenterology

## 2017-11-04 DIAGNOSIS — Z9049 Acquired absence of other specified parts of digestive tract: Secondary | ICD-10-CM | POA: Diagnosis not present

## 2017-11-04 DIAGNOSIS — Z1211 Encounter for screening for malignant neoplasm of colon: Secondary | ICD-10-CM | POA: Insufficient documentation

## 2017-11-04 DIAGNOSIS — K2210 Ulcer of esophagus without bleeding: Secondary | ICD-10-CM | POA: Insufficient documentation

## 2017-11-04 DIAGNOSIS — K2950 Unspecified chronic gastritis without bleeding: Secondary | ICD-10-CM | POA: Diagnosis not present

## 2017-11-04 DIAGNOSIS — F419 Anxiety disorder, unspecified: Secondary | ICD-10-CM | POA: Insufficient documentation

## 2017-11-04 DIAGNOSIS — Z7982 Long term (current) use of aspirin: Secondary | ICD-10-CM | POA: Diagnosis not present

## 2017-11-04 DIAGNOSIS — F329 Major depressive disorder, single episode, unspecified: Secondary | ICD-10-CM | POA: Insufficient documentation

## 2017-11-04 DIAGNOSIS — Z6830 Body mass index (BMI) 30.0-30.9, adult: Secondary | ICD-10-CM | POA: Diagnosis not present

## 2017-11-04 DIAGNOSIS — K210 Gastro-esophageal reflux disease with esophagitis: Secondary | ICD-10-CM | POA: Diagnosis not present

## 2017-11-04 DIAGNOSIS — E669 Obesity, unspecified: Secondary | ICD-10-CM | POA: Insufficient documentation

## 2017-11-04 DIAGNOSIS — D649 Anemia, unspecified: Secondary | ICD-10-CM | POA: Diagnosis not present

## 2017-11-04 DIAGNOSIS — Z79899 Other long term (current) drug therapy: Secondary | ICD-10-CM | POA: Insufficient documentation

## 2017-11-04 DIAGNOSIS — I10 Essential (primary) hypertension: Secondary | ICD-10-CM | POA: Diagnosis not present

## 2017-11-04 DIAGNOSIS — K449 Diaphragmatic hernia without obstruction or gangrene: Secondary | ICD-10-CM | POA: Insufficient documentation

## 2017-11-04 DIAGNOSIS — D125 Benign neoplasm of sigmoid colon: Secondary | ICD-10-CM | POA: Diagnosis not present

## 2017-11-04 DIAGNOSIS — Z87891 Personal history of nicotine dependence: Secondary | ICD-10-CM | POA: Diagnosis not present

## 2017-11-04 HISTORY — DX: Epigastric pain: R10.13

## 2017-11-04 HISTORY — DX: Irregular menstruation, unspecified: N92.6

## 2017-11-04 HISTORY — PX: ESOPHAGOGASTRODUODENOSCOPY: SHX5428

## 2017-11-04 HISTORY — PX: COLONOSCOPY WITH PROPOFOL: SHX5780

## 2017-11-04 SURGERY — EGD (ESOPHAGOGASTRODUODENOSCOPY)
Anesthesia: General

## 2017-11-04 MED ORDER — EPHEDRINE SULFATE 50 MG/ML IJ SOLN
INTRAMUSCULAR | Status: DC | PRN
  Administered 2017-11-04 (×2): 10 mg via INTRAVENOUS

## 2017-11-04 MED ORDER — FENTANYL CITRATE (PF) 100 MCG/2ML IJ SOLN
INTRAMUSCULAR | Status: AC
  Filled 2017-11-04: qty 2

## 2017-11-04 MED ORDER — PROPOFOL 500 MG/50ML IV EMUL
INTRAVENOUS | Status: AC
  Filled 2017-11-04: qty 50

## 2017-11-04 MED ORDER — EPHEDRINE SULFATE 50 MG/ML IJ SOLN
INTRAMUSCULAR | Status: AC
  Filled 2017-11-04: qty 1

## 2017-11-04 MED ORDER — SODIUM CHLORIDE 0.9 % IV SOLN: 2.0000 g | Freq: Once | INTRAVENOUS | Status: DC

## 2017-11-04 MED ORDER — SODIUM CHLORIDE 0.9 % IV SOLN
INTRAVENOUS | Status: AC
  Administered 2017-11-04: 12:00:00
  Filled 2017-11-04: qty 2000

## 2017-11-04 MED ORDER — LIDOCAINE HCL (PF) 2 % IJ SOLN
INTRAMUSCULAR | Status: AC
  Filled 2017-11-04: qty 10

## 2017-11-04 MED ORDER — PROPOFOL 10 MG/ML IV BOLUS
INTRAVENOUS | Status: AC
  Filled 2017-11-04: qty 20

## 2017-11-04 MED ORDER — PROPOFOL 500 MG/50ML IV EMUL
INTRAVENOUS | Status: DC | PRN
  Administered 2017-11-04: 120 ug/kg/min via INTRAVENOUS

## 2017-11-04 MED ORDER — LIDOCAINE HCL (CARDIAC) 20 MG/ML IV SOLN
INTRAVENOUS | Status: DC | PRN
  Administered 2017-11-04: 30 mg via INTRAVENOUS

## 2017-11-04 MED ORDER — FENTANYL CITRATE (PF) 100 MCG/2ML IJ SOLN
INTRAMUSCULAR | Status: DC | PRN
  Administered 2017-11-04: 25 ug via INTRAVENOUS
  Administered 2017-11-04: 50 ug via INTRAVENOUS
  Administered 2017-11-04: 25 ug via INTRAVENOUS

## 2017-11-04 MED ORDER — SODIUM CHLORIDE 0.9 % IV SOLN
INTRAVENOUS | Status: DC
  Administered 2017-11-04: 12:00:00 via INTRAVENOUS

## 2017-11-04 MED ORDER — MIDAZOLAM HCL 2 MG/2ML IJ SOLN
INTRAMUSCULAR | Status: AC
  Filled 2017-11-04: qty 2

## 2017-11-04 MED ORDER — MIDAZOLAM HCL 2 MG/2ML IJ SOLN
INTRAMUSCULAR | Status: DC | PRN
  Administered 2017-11-04: 2 mg via INTRAVENOUS

## 2017-11-04 NOTE — Progress Notes (Signed)
Explained procedure and intra procedure with patient via interpreter. Patient voices understanding

## 2017-11-04 NOTE — Anesthesia Preprocedure Evaluation (Signed)
Anesthesia Evaluation  Patient identified by MRN, date of birth, ID band Patient awake    Reviewed: Allergy & Precautions, NPO status , Patient's Chart, lab work & pertinent test results  History of Anesthesia Complications Negative for: history of anesthetic complications  Airway Mallampati: II  TM Distance: >3 FB Neck ROM: Full    Dental no notable dental hx.    Pulmonary neg sleep apnea, neg COPD, former smoker,    breath sounds clear to auscultation- rhonchi (-) wheezing      Cardiovascular Exercise Tolerance: Good hypertension, Pt. on medications (-) CAD, (-) Past MI, (-) Cardiac Stents and (-) CABG  Rhythm:Regular Rate:Normal - Systolic murmurs and - Diastolic murmurs    Neuro/Psych PSYCHIATRIC DISORDERS Anxiety Depression negative neurological ROS     GI/Hepatic Neg liver ROS, GERD  ,  Endo/Other  negative endocrine ROSneg diabetes  Renal/GU negative Renal ROS     Musculoskeletal  (+) Arthritis ,   Abdominal (+) + obese,   Peds  Hematology  (+) anemia ,   Anesthesia Other Findings Past Medical History: No date: Anemia No date: Anemia No date: Anxiety No date: Arthritis No date: Depression No date: Dyspepsia No date: GERD (gastroesophageal reflux disease) No date: Heart burn No date: Hypertension No date: Irregular menstrual cycle   Reproductive/Obstetrics                             Anesthesia Physical Anesthesia Plan  ASA: II  Anesthesia Plan: General   Post-op Pain Management:    Induction: Intravenous  PONV Risk Score and Plan: 2 and Propofol infusion  Airway Management Planned: Natural Airway  Additional Equipment:   Intra-op Plan:   Post-operative Plan:   Informed Consent: I have reviewed the patients History and Physical, chart, labs and discussed the procedure including the risks, benefits and alternatives for the proposed anesthesia with the patient  or authorized representative who has indicated his/her understanding and acceptance.   Dental advisory given  Plan Discussed with: CRNA and Anesthesiologist  Anesthesia Plan Comments:         Anesthesia Quick Evaluation

## 2017-11-04 NOTE — Anesthesia Post-op Follow-up Note (Signed)
Anesthesia QCDR form completed.        

## 2017-11-04 NOTE — H&P (Signed)
Outpatient short stay form Pre-procedure 11/04/2017 1:55 PM Lollie Sails MD  Primary Physician:  Dr Althia Forts, Dr. Karl Luke  Reason for visit: EGD and colonoscopy  History of present illness: Patient is a 52 year old female presenting today as above.  She has a history of gastroesophageal reflux is a bitter taste rather than burning sensation.  Does have a history of cholecystectomy in the past remote.  There is some dyspepsia.  There is also some dysphagia but the patient does not regurgitate foods.  She does however have a barium swallow done on 09/23/2017 that showed a small reducible hiatal hernia but no evidence of stricture or esophagitis. Is also presenting for colon cancer screening at same time.  Had no previous colonoscopy.  She tolerated prep well.  She takes no aspirin or blood thinning agent.   Current Facility-Administered Medications:  .  0.9 %  sodium chloride infusion, , Intravenous, Continuous, Lollie Sails, MD, Last Rate: 20 mL/hr at 11/04/17 1211 .  ampicillin (OMNIPEN) 2 g in sodium chloride 0.9 % 100 mL IVPB, 2 g, Intravenous, Once, Lollie Sails, MD  Medications Prior to Admission  Medication Sig Dispense Refill Last Dose  . gabapentin (NEURONTIN) 300 MG capsule Take 300 mg by mouth 3 (three) times daily.   11/01/2017 at 2100  . oxyCODONE-acetaminophen (PERCOCET/ROXICET) 5-325 MG tablet Take 1-2 tablets by mouth every 4 (four) hours as needed for moderate pain. (Patient taking differently: Take 1 tablet by mouth every 4 (four) hours as needed for moderate pain. ) 40 tablet 0 11/01/2017 at 2100  . Vitamin D, Ergocalciferol, (DRISDOL) 50000 units CAPS capsule Take 50,000 Units by mouth every 7 (seven) days.   11/01/2017 at 2100  . aspirin 325 MG tablet Take 1 tablet (325 mg total) by mouth 2 (two) times daily. (Patient not taking: Reported on 11/04/2017) 60 tablet 0 Not Taking at Unknown time  . lisinopril (PRINIVIL,ZESTRIL) 20 MG tablet Take 20 mg by  mouth daily with breakfast.    11/01/2017 at 2100  . pantoprazole (PROTONIX) 40 MG tablet Take 40 mg by mouth 2 (two) times daily as needed.    11/01/2017 at 2100  . sertraline (ZOLOFT) 50 MG tablet Take 50 mg by mouth daily.    11/01/2017 at 2100  . tiZANidine (ZANAFLEX) 2 MG tablet Take 2 mg by mouth 3 times/day as needed-between meals & bedtime.    11/01/2017 at 2100  . traMADol (ULTRAM) 50 MG tablet Take 1 tablet (50 mg total) by mouth as needed. (Patient taking differently: Take 50 mg by mouth 2 (two) times daily as needed for moderate pain. ) 40 tablet 1 11/01/2017 at 2100  . zolpidem (AMBIEN) 10 MG tablet Take 10 mg by mouth at bedtime.    11/01/2017 at 2100     No Known Allergies   Past Medical History:  Diagnosis Date  . Anemia   . Anemia   . Anxiety   . Arthritis   . Depression   . Dyspepsia   . GERD (gastroesophageal reflux disease)   . Heart burn   . Hypertension   . Irregular menstrual cycle     Review of systems:      Physical Exam    Heart and lungs:, Lungs are bilaterally clear.    HEENT: Cephalic atraumatic eyes are anicteric    Other:    Pertinant exam for procedure: Soft nontender nondistended bowel sounds positive normoactive.    Planned proceedures: EGD, colonoscopy and indicated procedures. I have  discussed the risks benefits and complications of procedures to include not limited to bleeding, infection, perforation and the risk of sedation and the patient wishes to proceed.    Lollie Sails, MD Gastroenterology 11/04/2017  1:55 PM

## 2017-11-04 NOTE — Op Note (Addendum)
[facility] Gastroenterology Patient Name: Caitlyn [name] Procedure Date: 11/04/2017 2:02 PM MRN: [id number] Account #: [id number] Date of Birth: August 03, 1966 Admit Type: Outpatient Age: 52 Room: [facility] ENDO ROOM 1 Gender: Female Note Status: Finalized Procedure:            Upper GI endoscopy Indications:          Dyspepsia, Dysphagia, Gastro-esophageal reflux disease Providers:            Lollie Sails, MD Referring MD:         Dyke Maes. Mancheno Revelo (Referring MD) Medicines:            Monitored Anesthesia Care Complications:        No immediate complications. Procedure:            Pre-Anesthesia Assessment:                       - ASA Grade Assessment: II - A patient with mild                        systemic disease.                       After obtaining informed consent, the endoscope was                        passed under direct vision. Throughout the procedure,                        the patient's blood pressure, pulse, and oxygen                        saturations were monitored continuously. The Endoscope                        was introduced through the mouth, and advanced to the                        pylorus. The upper GI endoscopy was accomplished                        without difficulty. The patient tolerated the procedure                        well. Findings:      LA Grade A (one or more mucosal breaks less than 5 mm, not extending       between tops of 2 mucosal folds) esophagitis with no bleeding was found.       Biopsies were taken with a cold forceps for histology.      The exam of the esophagus was otherwise normal.      Patchy minimal inflammation characterized by erythema was found in the       gastric body. Biopsies were taken with a cold forceps for histology.      The cardia and gastric fundus were normal on retroflexion.      The exam of the stomach was otherwise normal.      There is pyloric spasm noted, that would not  relax adequately to place       the scope into the duodenum, despite several efforts. What could be seen       beyond the  opening in the bulb appeared normal. Impression:           - LA Grade A erosive esophagitis. Biopsied.                       - Bile gastritis. Biopsied. Recommendation:       - Use Protonix (pantoprazole) 40 mg PO daily daily.                       - Use sucralfate tablets 1 gram PO QID for 1 month.                       - Return to GI clinic in 1 month.                       - Do an upper GI series at appointment to be scheduled.                       - May consider low residue diet or levbid depending on                        UGIS (and possible gastric emptying study). Procedure Code(s):    --- Professional ---                       [phone], 36, Esophagogastroduodenoscopy, flexible,                        transoral; with biopsy, single or multiple Diagnosis Code(s):    --- Professional ---                       K20.8, Other esophagitis                       K29.60, Other gastritis without bleeding                       R10.13, Epigastric pain                       R13.10, Dysphagia, unspecified                       K21.9, Gastro-esophageal reflux disease without                        esophagitis CPT copyright 2016 American Medical Association. All rights reserved. The codes documented in this report are preliminary and upon coder review may  be revised to meet current compliance requirements. Lollie Sails, MD 11/04/2017 2:24:48 PM This report has been signed electronically. Number of Addenda: 0 Note Initiated On: 11/04/2017 2:02 PM      [facility]

## 2017-11-04 NOTE — Anesthesia Procedure Notes (Signed)
Performed by: Cook-Martin, Halston Kintz Pre-anesthesia Checklist: Patient identified, Emergency Drugs available, Suction available, Patient being monitored and Timeout performed Patient Re-evaluated:Patient Re-evaluated prior to induction Oxygen Delivery Method: Nasal cannula Preoxygenation: Pre-oxygenation with 100% oxygen Induction Type: IV induction Airway Equipment and Method: Bite block Placement Confirmation: CO2 detector and positive ETCO2       

## 2017-11-04 NOTE — Op Note (Signed)
[facility] Gastroenterology Patient Name: Caitlyn [name] Procedure Date: 11/04/2017 1:48 PM MRN: [id number] Account #: [id number] Date of Birth: 1965/12/01 Admit Type: Outpatient Age: 52 Room: [facility] ENDO ROOM 1 Gender: Female Note Status: Finalized Procedure:            Colonoscopy Indications:          Screening for colorectal malignant neoplasm, This is                        the patient's first colonoscopy Providers:            Lollie Sails, MD Referring MD:         Dyke Maes. Mancheno Revelo (Referring MD) Medicines:            Monitored Anesthesia Care Complications:        No immediate complications. Procedure:            Pre-Anesthesia Assessment:                       - ASA Grade Assessment: II - A patient with mild                        systemic disease.                       After obtaining informed consent, the colonoscope was                        passed under direct vision. Throughout the procedure,                        the patient's blood pressure, pulse, and oxygen                        saturations were monitored continuously. The                        Colonoscope was introduced through the anus and                        advanced to the the cecum, identified by appendiceal                        orifice and ileocecal valve. The colonoscopy was                        unusually difficult due to restricted mobility of the                        colon, significant looping and a tortuous colon.                        Successful completion of the procedure was aided by                        changing the patient to a supine position, changing the                        patient to a prone position, using manual pressure and  withdrawing and reinserting the scope. The patient                        tolerated the procedure well. The quality of the bowel                        preparation was good and fair. Findings:      A 2 mm polyp was found in the distal sigmoid colon. The polyp was       sessile. The polyp was removed with a cold biopsy forceps. Resection and       retrieval were complete. No biopsies or other specimens were collected       for this exam.      The exam was otherwise without abnormality.      The digital rectal exam was normal. Impression:           - Preparation of the colon was fair.                       - One 2 mm polyp in the distal sigmoid colon, removed                        with a cold biopsy forceps. Resected and retrieved. No                        specimens collected.                       - The examination was otherwise normal. Recommendation:       - Discharge patient to home.                       - Telephone GI clinic for pathology results in 1 week. Procedure Code(s):    --- Professional ---                       [phone], Colonoscopy, flexible; with biopsy, single or                        multiple Diagnosis Code(s):    --- Professional ---                       Z12.11, Encounter for screening for malignant neoplasm                        of colon                       D12.5, Benign neoplasm of sigmoid colon CPT copyright 2016 [employer]. All rights reserved. The codes documented in this report are preliminary and upon coder review may  be revised to meet current compliance requirements. Lollie Sails, MD 11/04/2017 3:03:56 PM This report has been signed electronically. Number of Addenda: 0 Note Initiated On: 11/04/2017 1:48 PM Scope Withdrawal Time: 0 hours 8 minutes 0 seconds  Total Procedure Duration: 0 hours 30 minutes 45 seconds       [facility]

## 2017-11-04 NOTE — Transfer of Care (Signed)
Immediate Anesthesia Transfer of Care Note  Patient: Caitlyn [name]  Procedure(s) Performed: ESOPHAGOGASTRODUODENOSCOPY (EGD) (N/A ) COLONOSCOPY WITH PROPOFOL (N/A )  Patient Location: PACU  Anesthesia Type:General  Level of Consciousness: awake and sedated  Airway & Oxygen Therapy: Patient Spontanous Breathing and Patient connected to nasal cannula oxygen  Post-op Assessment: Report given to RN and Post -op Vital signs reviewed and stable  Post vital signs: Reviewed and stable  Last Vitals:  Vitals:   11/04/17 1157  BP: (!) 182/94  Pulse: 70  Resp: 18  Temp: 36.6 C  SpO2: 100%    Last Pain:  Vitals:   11/04/17 1157  TempSrc: Tympanic      Patients Stated Pain Goal: 3 (32/44/01 0272)  Complications: No apparent anesthesia complications

## 2017-11-05 ENCOUNTER — Encounter: Payer: Self-pay | Admitting: Gastroenterology

## 2017-11-08 ENCOUNTER — Other Ambulatory Visit: Payer: Self-pay | Admitting: Gastroenterology

## 2017-11-08 DIAGNOSIS — R1013 Epigastric pain: Secondary | ICD-10-CM

## 2017-11-08 LAB — SURGICAL PATHOLOGY

## 2017-11-08 NOTE — Anesthesia Postprocedure Evaluation (Signed)
Anesthesia Post Note  Patient: Caitlyn [name]  Procedure(s) Performed: ESOPHAGOGASTRODUODENOSCOPY (EGD) (N/A ) COLONOSCOPY WITH PROPOFOL (N/A )  Anesthesia Type: General     Last Vitals:  Vitals:   11/04/17 1157 11/04/17 1506  BP: (!) 182/94   Pulse: 70   Resp: 18   Temp: 36.6 C (!) 36.1 C  SpO2: 100%     Last Pain:  Vitals:   11/04/17 1506  TempSrc: Tympanic                 Arrion Burruel

## 2017-11-12 IMAGING — CT CT HEAD W/O CM
3 series · 15 of 47 positions shown, 18 images · non-contrast
Comparison: None.

CLINICAL DATA: Headache with nausea and dizziness

EXAM:
CT HEAD WITHOUT CONTRAST
TECHNIQUE: Contiguous axial images were obtained from the base of the skull
through the vertex without intravenous contrast.

[Series 2: head wo · axial · 0.38mm/px · z∈[-83,+42]mm · 9 of 30 slices shown, 12 images]
[im 3/30  brain]
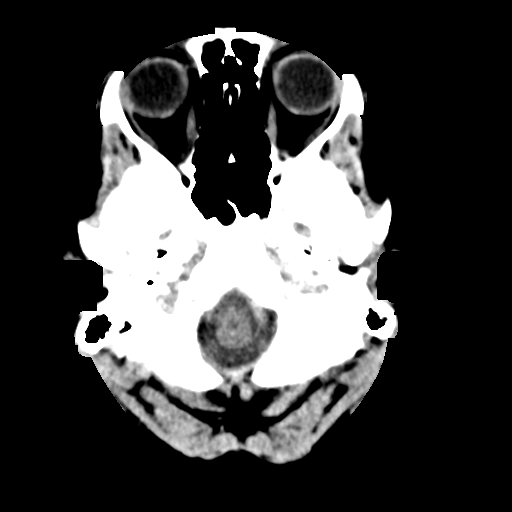
[im 3/30  bone]
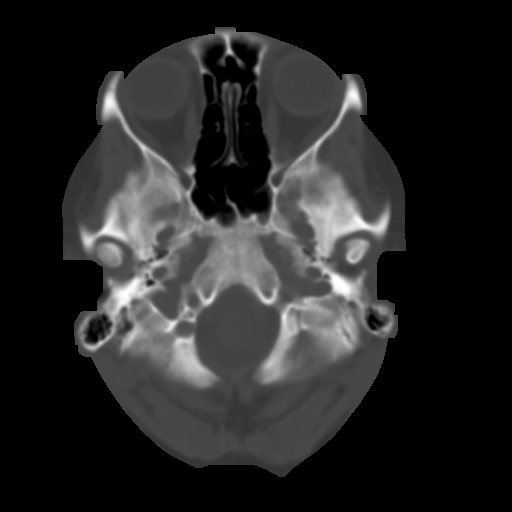
[im 6/30  brain]
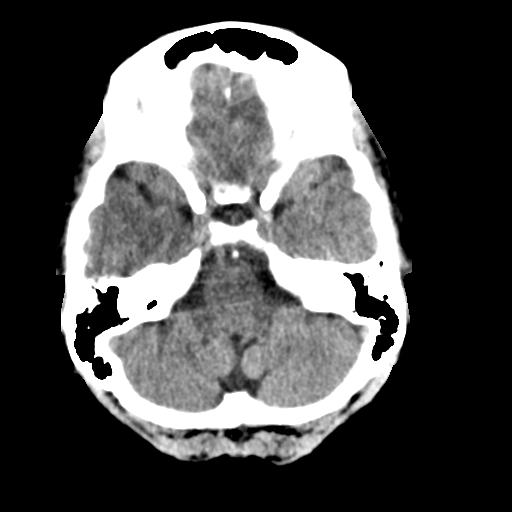
[im 9/30  brain]
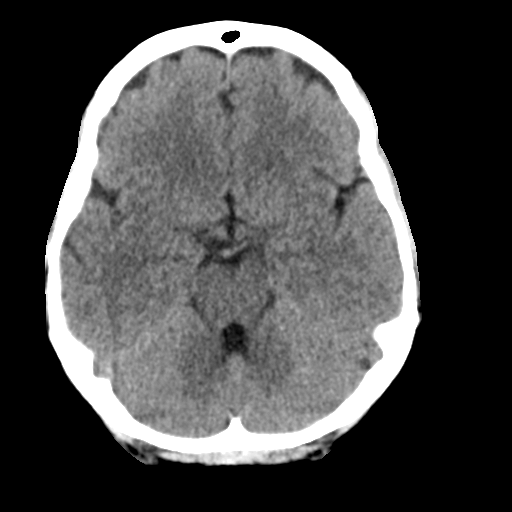
[im 12/30  brain]
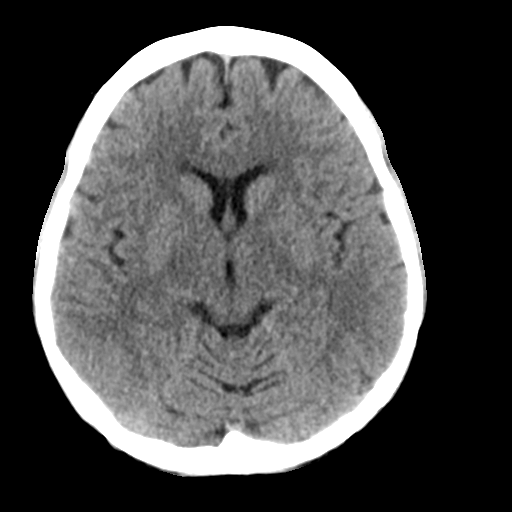
[im 16/30  brain]
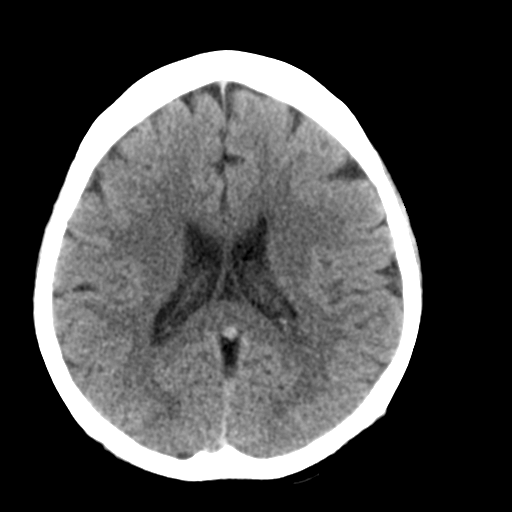
[im 16/30  bone]
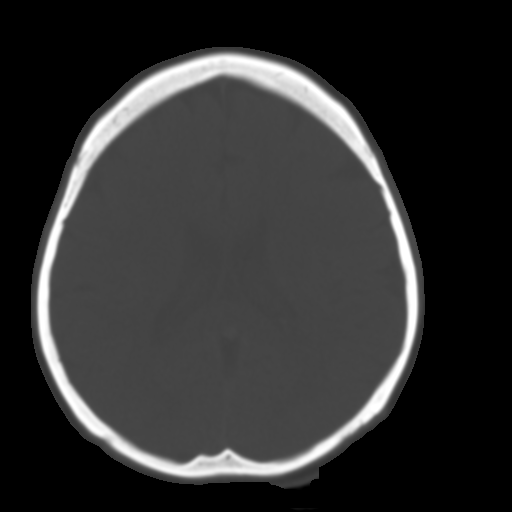
[im 19/30  brain]
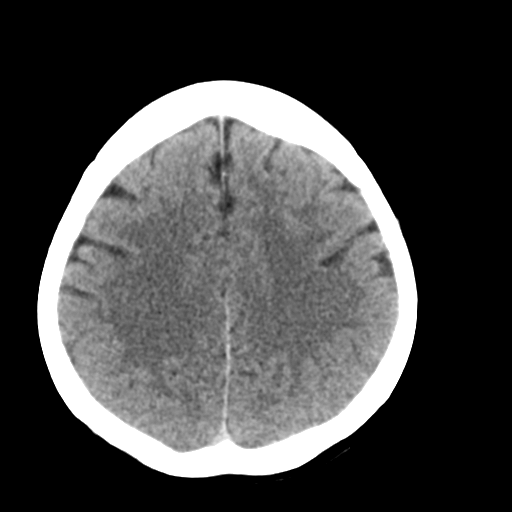
[im 22/30  brain]
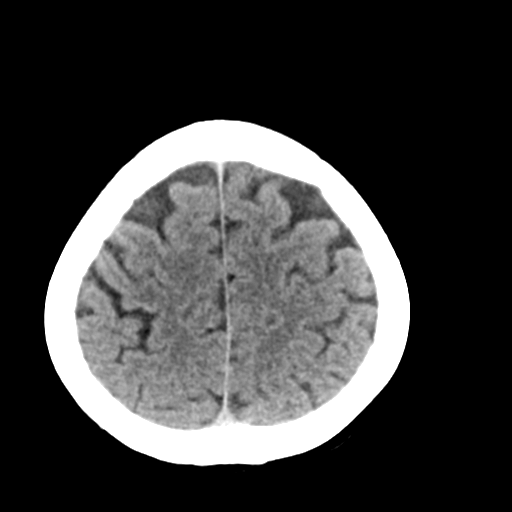
[im 25/30  brain]
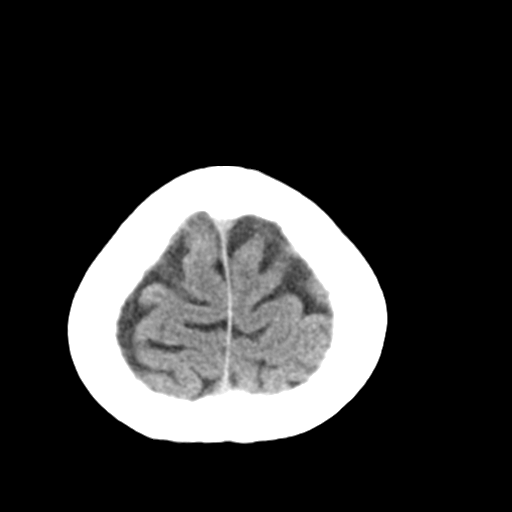
[im 28/30  brain]
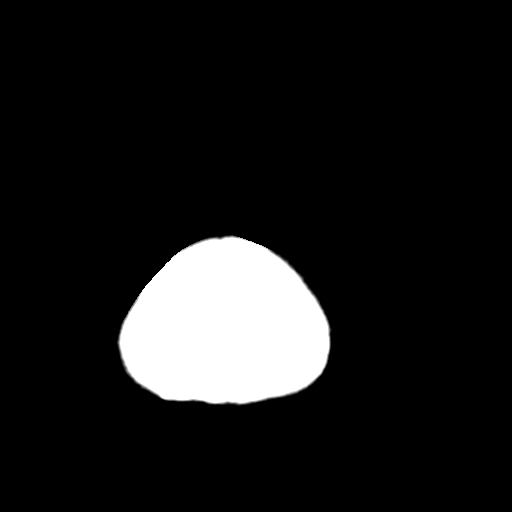
[im 28/30  bone]
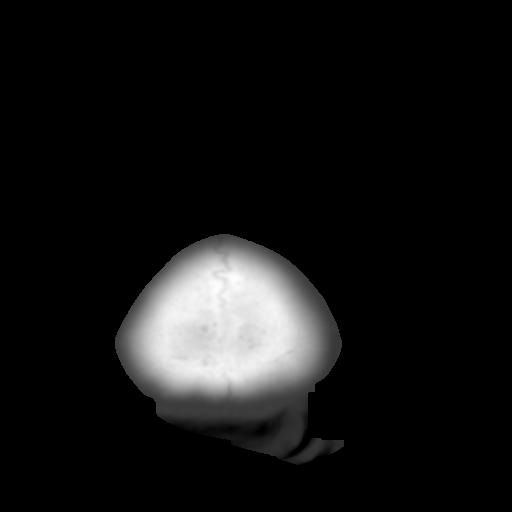

[Series 4: coronal soft tissue · coronal · 0.32mm/px · 3 of 60 slices shown]
[im 20/60  brain]
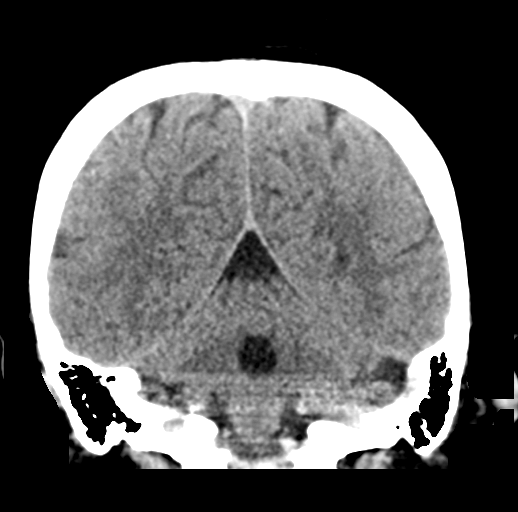
[im 27/60  brain]
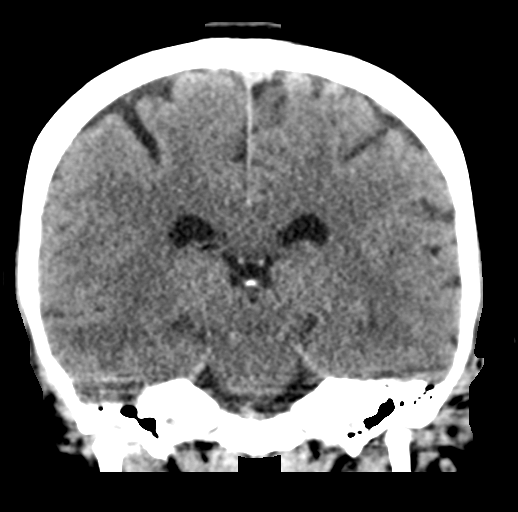
[im 33/60  brain]
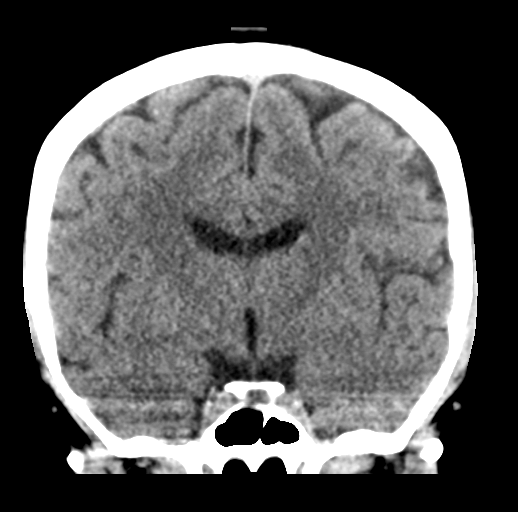

[Series 5: sagittal soft tissue · sagittal · 0.29mm/px · 3 of 51 slices shown]
[im 17/51  brain]
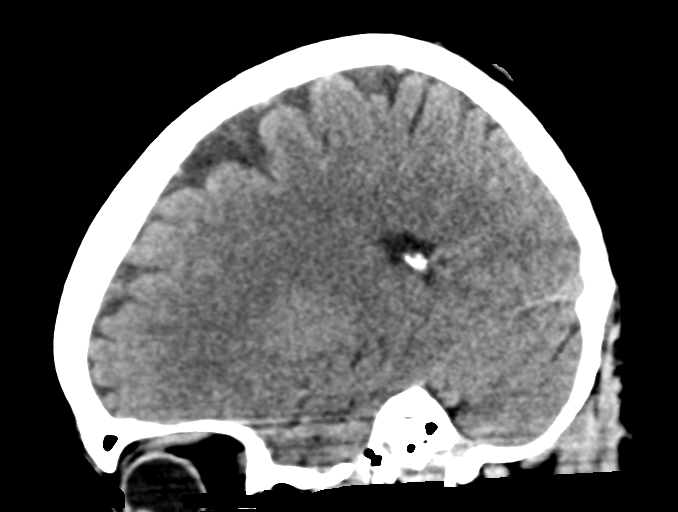
[im 26/51  brain]
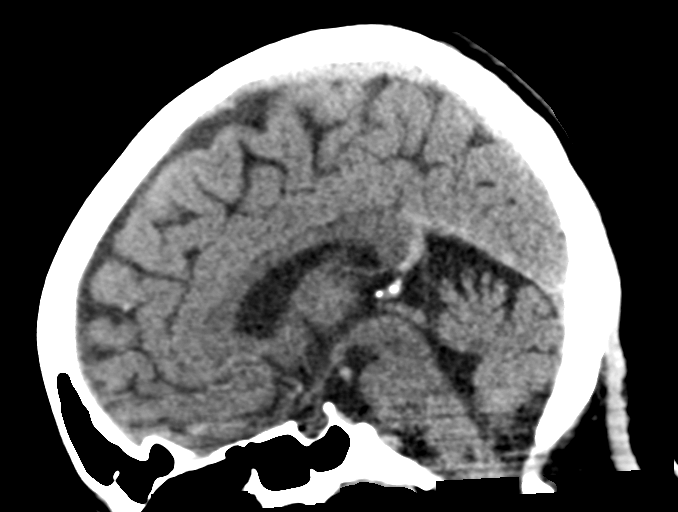
[im 34/51  brain]
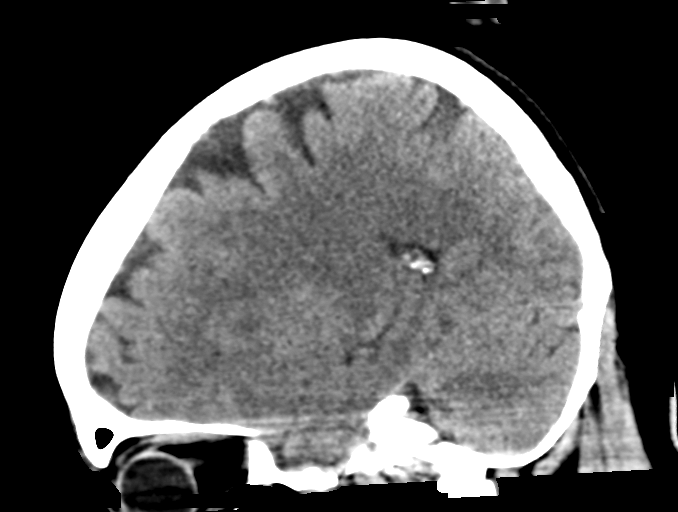

[15 of 47 positions shown; findings below may reference images not displayed]

FINDINGS: Brain: The ventricles are normal in size and configuration. There is
no intracranial mass, hemorrhage, extra-axial fluid collection, or
midline shift. There is slight small vessel disease in the centra
semiovale bilaterally. Elsewhere gray-white compartments appear
normal. No evident acute infarct.

Vascular: No hyperdense vessel. There is no appreciable vascular
calcification.

Skull: Bony calvarium appears intact. There is a partially calcified
sebaceous cyst over the left frontal bone measuring 8 x 8 mm.

Sinuses/Orbits: Visualized paranasal sinuses are clear. Visualized
orbits appear symmetric bilaterally.

Other: Mastoid air cells are clear.
IMPRESSION: Slight periventricular small vessel disease. No intracranial mass,
hemorrhage, or extra-axial fluid collection. No acute infarct.

Partially calcified small sebaceous cyst over the left frontal bone.

## 2017-11-12 IMAGING — CR DG CHEST 2V
1 series · 2 of 2 positions shown · non-contrast
Comparison: None.

CLINICAL DATA: Acute chest pain for 1 week.

EXAM:
CHEST  2 VIEW

[Series 1: dg chest 2 view · 0.14mm/px · 2 of 2 slices shown]
[im 1/2]
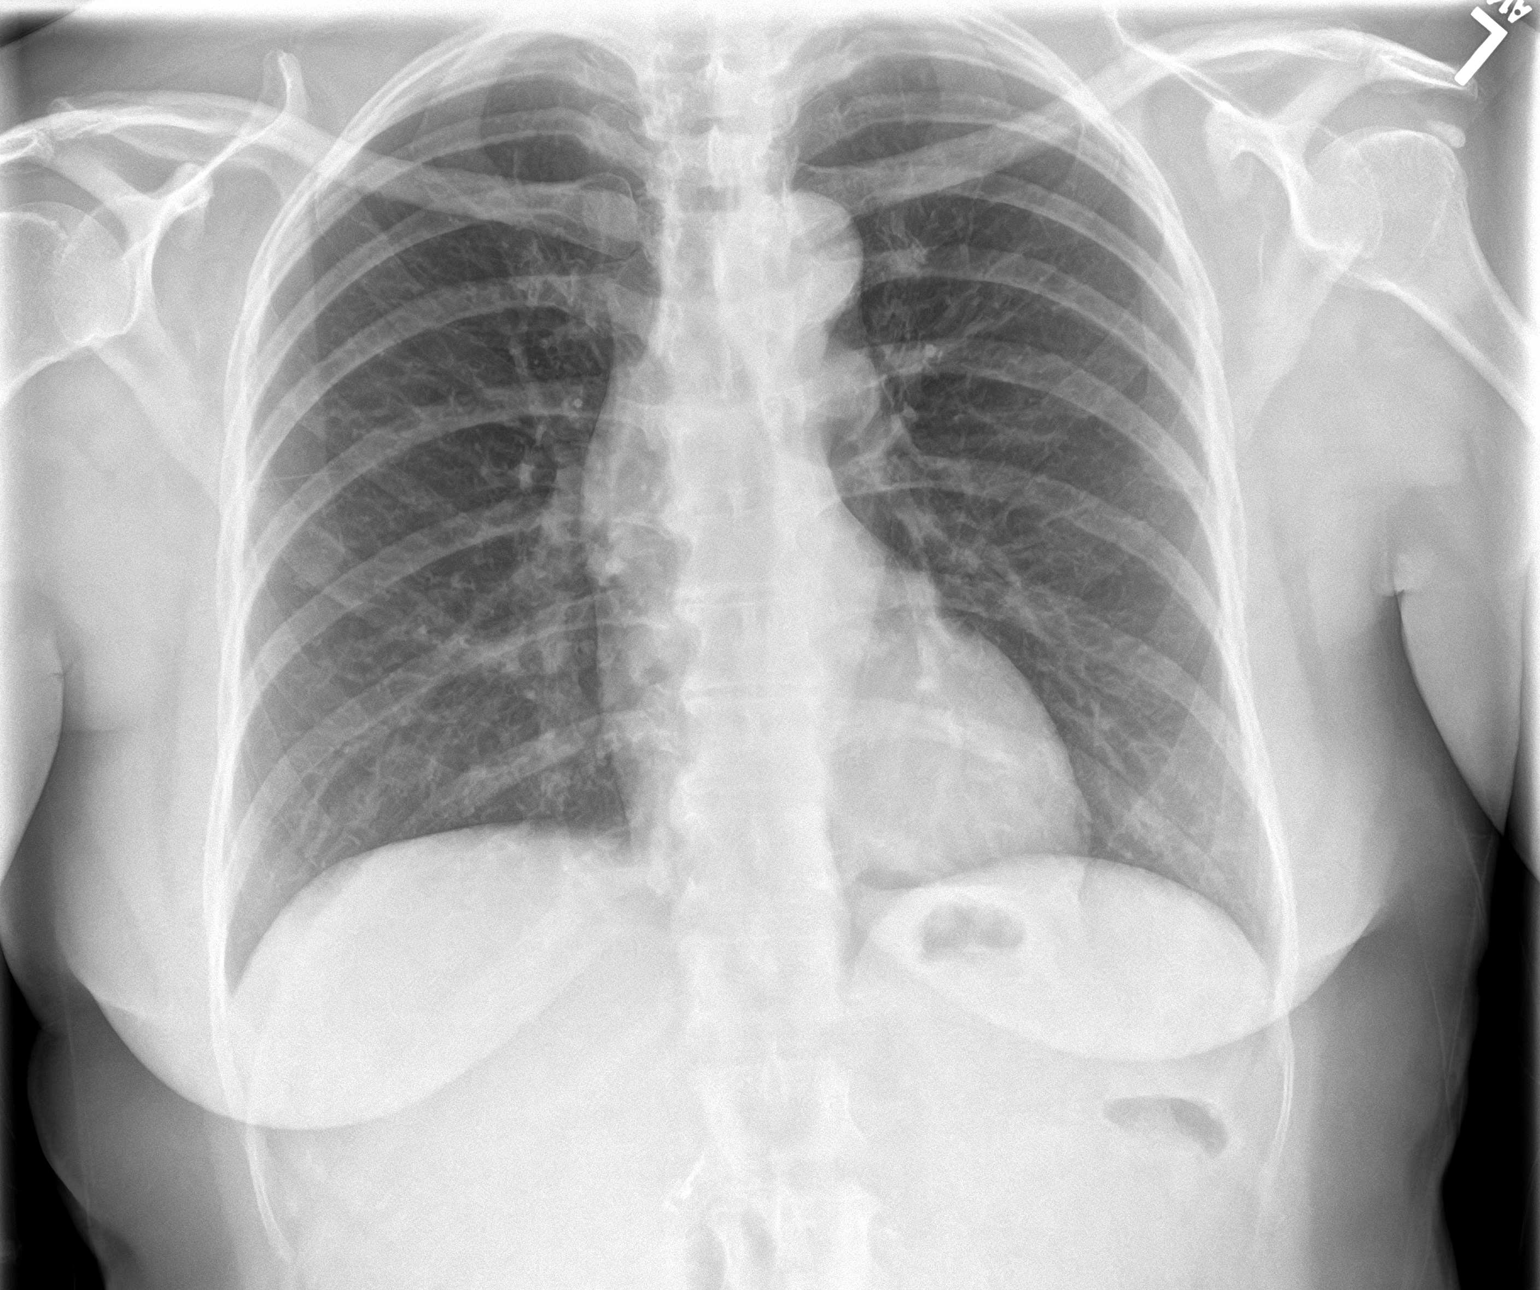
[im 2/2]
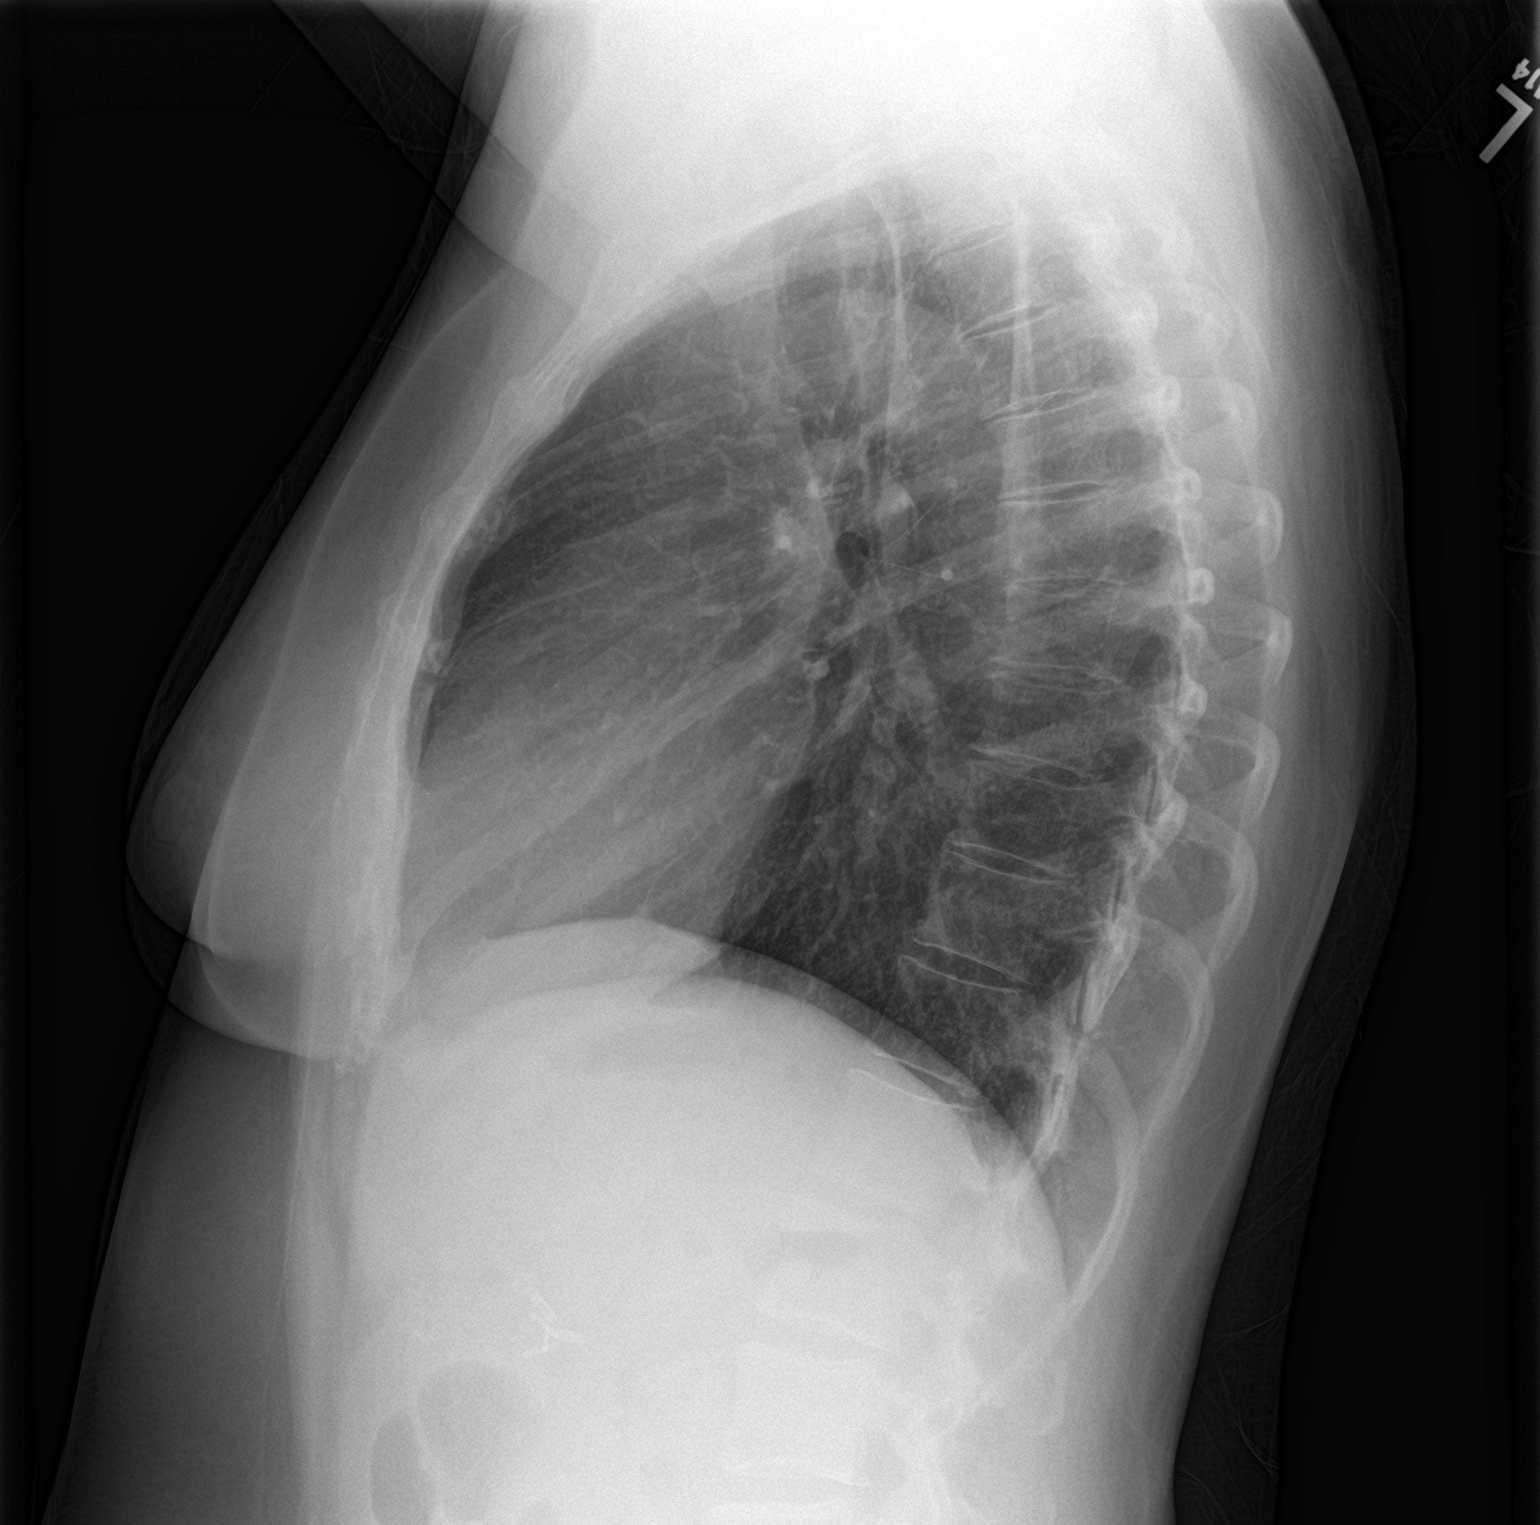

[2 of 2 positions shown; findings below may reference images not displayed]

FINDINGS: The cardiomediastinal silhouette is unremarkable.

There is no evidence of focal airspace disease, pulmonary edema,
suspicious pulmonary nodule/mass, pleural effusion, or pneumothorax.
No acute bony abnormalities are identified.
IMPRESSION: No active cardiopulmonary disease.

## 2017-11-23 ENCOUNTER — Ambulatory Visit
Admission: RE | Admit: 2017-11-23 | Discharge: 2017-11-23 | Disposition: A | Discharge status: Home / Self Care | Payer: BLUE CROSS/BLUE SHIELD | Source: Ambulatory Visit | Attending: Gastroenterology | Admitting: Gastroenterology

## 2017-11-23 DIAGNOSIS — R1013 Epigastric pain: Secondary | ICD-10-CM

## 2018-02-07 DIAGNOSIS — R002 Palpitations: Secondary | ICD-10-CM | POA: Insufficient documentation

## 2018-02-07 DIAGNOSIS — I10 Essential (primary) hypertension: Secondary | ICD-10-CM | POA: Insufficient documentation

## 2018-02-28 DIAGNOSIS — I340 Nonrheumatic mitral (valve) insufficiency: Secondary | ICD-10-CM | POA: Insufficient documentation

## 2018-02-28 DIAGNOSIS — I351 Nonrheumatic aortic (valve) insufficiency: Secondary | ICD-10-CM | POA: Insufficient documentation

## 2018-02-28 DIAGNOSIS — I361 Nonrheumatic tricuspid (valve) insufficiency: Secondary | ICD-10-CM | POA: Insufficient documentation

## 2018-03-01 ENCOUNTER — Encounter: Payer: BLUE CROSS/BLUE SHIELD | Admitting: Obstetrics and Gynecology

## 2018-04-20 ENCOUNTER — Other Ambulatory Visit: Payer: Self-pay | Admitting: Family Medicine

## 2018-04-20 DIAGNOSIS — Z1231 Encounter for screening mammogram for malignant neoplasm of breast: Secondary | ICD-10-CM

## 2018-05-03 DIAGNOSIS — E559 Vitamin D deficiency, unspecified: Secondary | ICD-10-CM | POA: Insufficient documentation

## 2018-06-22 ENCOUNTER — Encounter: Payer: BLUE CROSS/BLUE SHIELD | Admitting: Obstetrics and Gynecology

## 2018-07-07 ENCOUNTER — Emergency Department: Payer: BLUE CROSS/BLUE SHIELD

## 2018-07-07 ENCOUNTER — Other Ambulatory Visit: Payer: Self-pay

## 2018-07-07 ENCOUNTER — Encounter: Payer: Self-pay | Admitting: Emergency Medicine

## 2018-07-07 ENCOUNTER — Emergency Department
Admission: EM | Admit: 2018-07-07 | Discharge: 2018-07-08 | Disposition: A | Discharge status: Home / Self Care | Payer: BLUE CROSS/BLUE SHIELD | Attending: Emergency Medicine | Admitting: Emergency Medicine

## 2018-07-07 DIAGNOSIS — Z87891 Personal history of nicotine dependence: Secondary | ICD-10-CM | POA: Insufficient documentation

## 2018-07-07 DIAGNOSIS — Z96653 Presence of artificial knee joint, bilateral: Secondary | ICD-10-CM | POA: Insufficient documentation

## 2018-07-07 DIAGNOSIS — Z9114 Patient's other noncompliance with medication regimen: Secondary | ICD-10-CM | POA: Insufficient documentation

## 2018-07-07 DIAGNOSIS — R51 Headache: Secondary | ICD-10-CM | POA: Insufficient documentation

## 2018-07-07 DIAGNOSIS — R079 Chest pain, unspecified: Secondary | ICD-10-CM | POA: Insufficient documentation

## 2018-07-07 DIAGNOSIS — I10 Essential (primary) hypertension: Secondary | ICD-10-CM | POA: Diagnosis not present

## 2018-07-07 DIAGNOSIS — R110 Nausea: Secondary | ICD-10-CM | POA: Diagnosis not present

## 2018-07-07 DIAGNOSIS — Z79899 Other long term (current) drug therapy: Secondary | ICD-10-CM | POA: Insufficient documentation

## 2018-07-07 LAB — CBC
HEMATOCRIT: 41.1 % (ref 36.0–46.0)
Hemoglobin: 13.6 g/dL (ref 12.0–15.0)
MCH: 27.3 pg (ref 26.0–34.0)
MCHC: 33.1 g/dL (ref 30.0–36.0)
MCV: 82.4 fL (ref 80.0–100.0)
Platelets: 259 10*3/uL (ref 150–400)
RBC: 4.99 MIL/uL (ref 3.87–5.11)
RDW: 12.7 % (ref 11.5–15.5)
WBC: 8.6 10*3/uL (ref 4.0–10.5)
nRBC: 0 % (ref 0.0–0.2)

## 2018-07-07 LAB — BASIC METABOLIC PANEL
Anion gap: 7 (ref 5–15)
BUN: 12 mg/dL (ref 6–20)
CHLORIDE: 104 mmol/L (ref 98–111)
CO2: 30 mmol/L (ref 22–32)
CREATININE: 0.76 mg/dL (ref 0.44–1.00)
Calcium: 9.2 mg/dL (ref 8.9–10.3)
Glucose, Bld: 177 mg/dL — ABNORMAL HIGH (ref 70–99)
Potassium: 4 mmol/L (ref 3.5–5.1)
SODIUM: 141 mmol/L (ref 135–145)

## 2018-07-07 LAB — TROPONIN I

## 2018-07-07 NOTE — ED Notes (Signed)
Dr. Sung at the bedside for pt evaluation 

## 2018-07-07 NOTE — ED Provider Notes (Signed)
[facility] Emergency Department Provider Note   ____________________________________________   First MD Initiated Contact with Patient 07/07/18 2329     (approximate)  I have reviewed the triage vital signs and the nursing notes.   HISTORY  Chief Complaint Hypertension  History obtained via Spanish interpreter  HPI Caitlyn [name] is a 52 y.o. female who presents to the ED from home with a chief complaint of headache and elevated blood pressure.  Reports her lisinopril was changed to metoprolol to 3 months ago.  She has missed several doses this week but resumed metoprolol as prescribed for the past 3 days.  Over the course of the last day patient complains of mild, gradual onset global headache and elevated blood pressure.  Symptoms associated with mild nausea intermittently.  Yesterday she had some mid chest pain; none currently.  Denies associated vision changes, neck pain, diaphoresis, shortness of breath, vomiting, abdominal pain, dysuria.  Denies recent travel or trauma.   Past Medical History:  Diagnosis Date  . Anemia   . Anemia   . Anxiety   . Arthritis   . Depression   . Dyspepsia   . GERD (gastroesophageal reflux disease)   . Heart burn   . Hypertension   . Irregular menstrual cycle     Patient Active Problem List   Diagnosis Date Noted  . Synovitis of left knee 10/21/2017  . Primary localized osteoarthritis of left knee 03/16/2017  . Surgical menopause, symptomatic 01/26/2017  . Status post abdominal hysterectomy and left salpingo-oophorectomy 01/26/2017  . S/P TAH (total abdominal hysterectomy) 01/20/2017  . 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 Primary localized osteoarthritis of right knee 07/07/2016  . Abnormal hepatitis serology 04/10/2016  . Polyarthralgia 04/03/2016  . Anemia 10/23/2015    Past Surgical History:  Procedure Laterality Date  . ABDOMINAL HYSTERECTOMY Left 01/18/2017   Procedure: HYSTERECTOMY ABDOMINAL WITH  LEFT SALPINGO OOPHERECTOMY;  Surgeon: Brayton Mars, MD;  Location: [facility] ORS;  Service: Gynecology;  Laterality: Left;  . CHOLECYSTECTOMY    . CHOLECYSTECTOMY, LAPAROSCOPIC    . COLONOSCOPY WITH PROPOFOL N/A 11/04/2017   Procedure: COLONOSCOPY WITH PROPOFOL;  Surgeon: Lollie Sails, MD;  Location: [facility] ENDOSCOPY;  Service: Endoscopy;  Laterality: N/A;  . ESOPHAGOGASTRODUODENOSCOPY N/A 11/04/2017   Procedure: ESOPHAGOGASTRODUODENOSCOPY (EGD);  Surgeon: Lollie Sails, MD;  Location: [facility] ENDOSCOPY;  Service: Endoscopy;  Laterality: N/A;  . JOINT REPLACEMENT    . KNEE ARTHROSCOPY Left 12/03/2016   Procedure: ARTHROSCOPY KNEE, partial medial menisectomy;  Surgeon: Hessie Knows, MD;  Location: [facility] ORS;  Service: Orthopedics;  Laterality: Left;  . KNEE ARTHROTOMY Left 10/21/2017   Procedure: KNEE ARTHROTOMY LEFT LATERAL RELEASE MEDIAL RETINACULART REPAIR POLY EXCHANGE;  Surgeon: Hessie Knows, MD;  Location: [facility] ORS;  Service: Orthopedics;  Laterality: Left;  . KNEE CLOSED REDUCTION Left 04/15/2017   Procedure: CLOSED MANIPULATION KNEE;  Surgeon: Hessie Knows, MD;  Location: [facility] ORS;  Service: Orthopedics;  Laterality: Left;  . LAPAROSCOPIC OVARIAN CYSTECTOMY Right   . MENISCECTOMY Right   . TOTAL KNEE ARTHROPLASTY Right 07/07/2016   Procedure: TOTAL KNEE ARTHROPLASTY;  Surgeon: Hessie Knows, MD;  Location: [facility] ORS;  Service: Orthopedics;  Laterality: Right;  . TOTAL KNEE ARTHROPLASTY Left 03/16/2017   Procedure: TOTAL KNEE ARTHROPLASTY;  Surgeon: Hessie Knows, MD;  Location: [facility] ORS;  Service: Orthopedics;  Laterality: Left;  . TUBAL LIGATION      Prior to Admission medications   Medication Sig Start Date End Date Taking? Authorizing Provider  aspirin 325  MG tablet Take 1 tablet (325 mg total) by mouth 2 (two) times daily. Patient not taking: Reported on 11/04/2017 10/22/17   Duanne Guess, PA-C  cloNIDine (CATAPRES) 0.1 MG tablet Take 1 tablet (0.1 mg total) by mouth 2 (two)  times daily as needed. 07/08/18   Paulette Blanch, MD  gabapentin (NEURONTIN) 300 MG capsule Take 300 mg by mouth 3 (three) times daily.    [provider]  lisinopril (PRINIVIL,ZESTRIL) 20 MG tablet Take 20 mg by mouth daily with breakfast.     [provider]  oxyCODONE-acetaminophen (PERCOCET/ROXICET) 5-325 MG tablet Take 1-2 tablets by mouth every 4 (four) hours as needed for moderate pain. Patient taking differently: Take 1 tablet by mouth every 4 (four) hours as needed for moderate pain.  10/22/17   Duanne Guess, PA-C  pantoprazole (PROTONIX) 40 MG tablet Take 40 mg by mouth 2 (two) times daily as needed.     [provider]  sertraline (ZOLOFT) 50 MG tablet Take 50 mg by mouth daily.     [provider]  tiZANidine (ZANAFLEX) 2 MG tablet Take 2 mg by mouth 3 times/day as needed-between meals & bedtime.     [provider]  traMADol (ULTRAM) 50 MG tablet Take 1 tablet (50 mg total) by mouth as needed. Patient taking differently: Take 50 mg by mouth 2 (two) times daily as needed for moderate pain.  03/18/17   Reche Dixon, PA-C  Vitamin D, Ergocalciferol, (DRISDOL) 50000 units CAPS capsule Take 50,000 Units by mouth every 7 (seven) days.    [provider]  zolpidem (AMBIEN) 10 MG tablet Take 10 mg by mouth at bedtime.     [provider]    Allergies Patient has no known allergies.  Family History  Problem Relation Age of Onset  . Osteoarthritis Mother   . Heart failure Father   . Diabetes Father   . Hypertension Father   . Osteoarthritis Sister     Social History Social History   Tobacco Use  . Smoking status: Former Smoker    Packs/day: 0.50    Types: Cigarettes    Last attempt to quit: 03/01/2015    Years since quitting: 3.3  . Smokeless tobacco: Never Used  Substance Use Topics  . Alcohol use: Yes    Alcohol/week: 0.0 standard drinks    Comment: occasionally  . Drug use: No    Review of  Systems  Constitutional: No fever/chills Eyes: No visual changes. ENT: No sore throat. Cardiovascular: Positive for chest pain. Respiratory: Denies shortness of breath. Gastrointestinal: No abdominal pain.  Positive for nausea, no vomiting.  No diarrhea.  No constipation. Genitourinary: Negative for dysuria. Musculoskeletal: Negative for back pain. Skin: Negative for rash. Neurological: Positive for headache. Negative for focal weakness or numbness.   ____________________________________________   PHYSICAL EXAM:  VITAL SIGNS: ED Triage Vitals  Enc Vitals Group     BP 07/07/18 2112 (!) 190/90     Pulse Rate 07/07/18 2112 63     Resp 07/07/18 2112 20     Temp 07/07/18 2112 99.2 F (37.3 C)     Temp Source 07/07/18 2112 Oral     SpO2 07/07/18 2112 97 %     Weight 07/07/18 2114 184 lb (83.5 kg)     Height 07/07/18 2114 5\' 4"  (1.626 m)     Head Circumference --      Peak Flow --      Pain Score 07/07/18 2114 6  Pain Loc --      Pain Edu? --      Excl. in [place]? --     Constitutional: Alert and oriented. Well appearing and in no acute distress. Eyes: Conjunctivae are normal. PERRL. EOMI. Head: Atraumatic. Nose: No congestion/rhinnorhea. Mouth/Throat: Mucous membranes are moist.  Oropharynx non-erythematous. Neck: No stridor.  Supple neck without meningismus.  No carotid bruits. Cardiovascular: Normal rate, regular rhythm. Grossly normal heart sounds.  Good peripheral circulation. Respiratory: Normal respiratory effort.  No retractions. Lungs CTAB. Gastrointestinal: Soft and nontender. No distention. No abdominal bruits. No CVA tenderness. Musculoskeletal: No lower extremity tenderness nor edema.  No joint effusions. Neurologic: Alert and oriented x3.  CN II-XII grossly intact.  Normal speech and language. No gross focal neurologic deficits are appreciated. No gait instability. Skin:  Skin is warm, dry and intact. No rash noted. Psychiatric: Mood and affect are normal.  Speech and behavior are normal.  ____________________________________________   LABS (all labs ordered are listed, but only abnormal results are displayed)  Labs Reviewed  BASIC METABOLIC PANEL - Abnormal; Notable for the following components:      Result Value   Glucose, Bld 177 (*)    All other components within normal limits  URINALYSIS, COMPLETE (UACMP) WITH MICROSCOPIC - Abnormal; Notable for the following components:   Color, Urine YELLOW (*)    APPearance CLEAR (*)    Bacteria, UA RARE (*)    All other components within normal limits  CBC  TROPONIN I  TROPONIN I   ____________________________________________  EKG  ED ECG REPORT I, Claudell Rhody J, the attending physician, personally viewed and interpreted this ECG.   Date: 07/08/2018  EKG Time: 2126  Rate: 70  Rhythm: normal EKG, normal sinus rhythm  Axis: Normal  Intervals:none  ST&T Change: Nonspecific  ____________________________________________  RADIOLOGY  ED MD interpretation: No acute cardiopulmonary process  Official radiology report(s): Dg Chest 2 View  Result Date: 07/07/2018 CLINICAL DATA:  Headache and hypertension.  Nausea.  Mid chest pain. EXAM: CHEST - 2 VIEW COMPARISON:  05/27/2017 FINDINGS: Normal heart size and pulmonary vascularity. No focal airspace disease or consolidation in the lungs. No blunting of costophrenic angles. No pneumothorax. Mediastinal contours appear intact. Surgical clips in the right upper quadrant. IMPRESSION: No active cardiopulmonary disease. Electronically Signed   By: Lucienne Capers M.D.   On: 07/07/2018 21:37    ____________________________________________   PROCEDURES  Procedure(s) performed: None  Procedures  Critical Care performed: No  ____________________________________________   INITIAL IMPRESSION / ASSESSMENT AND PLAN / ED COURSE  As part of my medical decision making, I reviewed the following data within the [place] History  obtained from family, Nursing notes reviewed and incorporated, Interpreter needed, Labs reviewed, EKG interpreted, Old chart reviewed, Radiograph reviewed  and Notes from prior ED visits   52 year old female who presents with headache and chest pain secondary to elevated blood pressure.  Missed several doses of her metoprolol this week. Differential diagnosis includes, but is not limited to, ACS, aortic dissection, pulmonary embolism, cardiac tamponade, pneumothorax, pneumonia, pericarditis, myocarditis, GI-related causes including esophagitis/gastritis, and musculoskeletal chest wall pain.    Initial EKG and troponin are unremarkable.  Current blood pressure 174/99.  Will administer clonidine 0.1 mg orally and reassess.  Will repeat timed troponin.  Clinical Course as of Jul 08 152  Fri Jul 08, 2018  0149 Updated patient and spouse are negative repeat troponin.  Patient feeling overall better.  Blood pressure mildly improved.  Emphasized the  importance of taking her metoprolol every day.  Will prescribe a limited prescription of clonidine with strict instructions.  Patient will follow-up with her PCP closely.  Strict return precautions given.  Patient and spouse verbalize understanding and agree with plan of care.   [JS]    Clinical Course User Index [JS] Paulette Blanch, MD     ____________________________________________   FINAL CLINICAL IMPRESSION(S) / ED DIAGNOSES  Final diagnoses:  Essential hypertension     ED Discharge Orders         Ordered    cloNIDine (CATAPRES) 0.1 MG tablet  2 times daily PRN     07/08/18 0152           Note:  This document was prepared using Dragon voice recognition software and may include unintentional dictation errors.    Paulette Blanch, MD 07/08/18 [phone]

## 2018-07-07 NOTE — ED Triage Notes (Signed)
Patient to ER for c/o headache and hypertension despite taking medications as prescribed for last 3 days. Reports very mild nausea intermittently. States she has developed mid chest pain that radiates to back and neck.

## 2018-07-08 LAB — TROPONIN I: Troponin I: <0.03 ng/mL (ref <0.03)

## 2018-07-08 LAB — URINALYSIS, COMPLETE (UACMP) WITH MICROSCOPIC
Bilirubin Urine: NEGATIVE
Glucose, UA: NEGATIVE mg/dL
Hgb urine dipstick: NEGATIVE
Ketones, ur: NEGATIVE mg/dL
Leukocytes, UA: NEGATIVE
Nitrite: NEGATIVE
Protein, ur: NEGATIVE mg/dL
Specific Gravity, Urine: 1.015 (ref 1.005–1.030)
pH: 6 (ref 5.0–8.0)

## 2018-07-08 MED ORDER — CLONIDINE HCL 0.1 MG PO TABS: 0.1000 mg | ORAL_TABLET | Freq: Two times a day (BID) | ORAL | 0 refills | Status: DC | PRN

## 2018-07-08 MED ORDER — CLONIDINE HCL 0.1 MG PO TABS
0.1000 mg | ORAL_TABLET | Freq: Once | ORAL | Status: AC
  Administered 2018-07-08: 0.1 mg via ORAL
  Filled 2018-07-08: qty 1

## 2018-07-08 NOTE — Discharge Instructions (Addendum)
1.  You must take your blood pressure medicines every day for it to be effective. 2.  Keep a log of your blood pressure morning, noon and evening.  Take this log to your doctor. 3.  You may take Clonidine 0.1 mg tablet up to 2 times daily maximum for the following blood pressure parameters: Top number >180 Bottom number >100 4.  Return to the ER for worsening symptoms, persistent vomiting, difficulty breathing or other concerns.

## 2018-11-11 ENCOUNTER — Ambulatory Visit (INDEPENDENT_AMBULATORY_CARE_PROVIDER_SITE_OTHER): Payer: Medicare Other | Admitting: Obstetrics and Gynecology

## 2018-11-11 ENCOUNTER — Other Ambulatory Visit: Payer: Self-pay | Admitting: Student

## 2018-11-11 ENCOUNTER — Encounter: Payer: Self-pay | Admitting: Obstetrics and Gynecology

## 2018-11-11 ENCOUNTER — Other Ambulatory Visit: Payer: Self-pay

## 2018-11-11 VITALS — BP 162/90 | HR 71 | Ht 64.0 in | Wt 196.8 lb

## 2018-11-11 DIAGNOSIS — N9089 Other specified noninflammatory disorders of vulva and perineum: Secondary | ICD-10-CM

## 2018-11-11 DIAGNOSIS — Z1239 Encounter for other screening for malignant neoplasm of breast: Secondary | ICD-10-CM | POA: Diagnosis not present

## 2018-11-11 DIAGNOSIS — M5412 Radiculopathy, cervical region: Secondary | ICD-10-CM

## 2018-11-11 DIAGNOSIS — M5414 Radiculopathy, thoracic region: Secondary | ICD-10-CM

## 2018-11-11 DIAGNOSIS — Z0000 Encounter for general adult medical examination without abnormal findings: Secondary | ICD-10-CM | POA: Diagnosis not present

## 2018-11-11 MED ORDER — TRIAMCINOLONE ACETONIDE 0.1 % EX CREA: TOPICAL_CREAM | Freq: Every day | CUTANEOUS | Status: DC

## 2018-11-11 NOTE — Progress Notes (Signed)
HPI:      [name] is a 53 y.o. [phone] who LMP was Patient's last menstrual period was 01/07/2017 (exact date).  Subjective:   She presents today for her annual examination.  She is generally doing well.  She has discontinued her estrogen is does not want to restart because she has no symptoms.  She is due for mammogram. She does complain of daily urine loss of small amounts especially with laughing but realizes that surgery is likely her only option and she does not want to consider that at this time. She believes that the chronic urine loss and using pads all the time has caused irritation of her vulva.    Hx: The following portions of the patient's history were reviewed and updated as appropriate:             She  has a past medical history of Anemia, Anemia, Anxiety, Arthritis, Depression, Dyspepsia, GERD (gastroesophageal reflux disease), Heart burn, Hypertension, and Irregular menstrual cycle. She does not have any pertinent problems on file. She  has a past surgical history that includes Cholecystectomy, laparoscopic; Laparoscopic ovarian cystectomy (Right); Tubal ligation; Meniscectomy (Right); Total knee arthroplasty (Right, 07/07/2016); Cholecystectomy; Joint replacement; Knee arthroscopy (Left, 12/03/2016); Abdominal hysterectomy (Left, 01/18/2017); Total knee arthroplasty (Left, 03/16/2017); Knee Closed Reduction (Left, 04/15/2017); Knee arthrotomy (Left, 10/21/2017); Esophagogastroduodenoscopy (N/A, 11/04/2017); and Colonoscopy with propofol (N/A, 11/04/2017). Her family history includes Diabetes in her father; Heart failure in her father; Hypertension in her father; Osteoarthritis in her mother and sister. She  reports that she quit smoking about 3 years ago. Her smoking use included cigarettes. She smoked 0.50 packs per day. She has never used smokeless tobacco. She reports current alcohol use. She reports that she does not use drugs. She has a current medication list which  includes the following prescription(s): diclofenac, metoprolol succinate, pantoprazole, sertraline, tizanidine, tramadol, vitamin d (ergocalciferol), and zolpidem, and the following Facility-Administered Medications: triamcinolone cream. She has No Known Allergies.       Review of Systems:  Review of Systems  Constitutional: Denied constitutional symptoms, night sweats, recent illness, fatigue, fever, insomnia and weight loss.  Eyes: Denied eye symptoms, eye pain, photophobia, vision change and visual disturbance.  Ears/Nose/Throat/Neck: Denied ear, nose, throat or neck symptoms, hearing loss, nasal discharge, sinus congestion and sore throat.  Cardiovascular: Denied cardiovascular symptoms, arrhythmia, chest pain/pressure, edema, exercise intolerance, orthopnea and palpitations.  Respiratory: Denied pulmonary symptoms, asthma, pleuritic pain, productive sputum, cough, dyspnea and wheezing.  Gastrointestinal: Denied, gastro-esophageal reflux, melena, nausea and vomiting.  Genitourinary: See HPI for additional information.  Musculoskeletal: Denied musculoskeletal symptoms, stiffness, swelling, muscle weakness and myalgia.  Dermatologic: Denied dermatology symptoms, rash and scar.  Neurologic: Denied neurology symptoms, dizziness, headache, neck pain and syncope.  Psychiatric: Denied psychiatric symptoms, anxiety and depression.  Endocrine: Denied endocrine symptoms including hot flashes and night sweats.   Meds:   Current Outpatient Medications on File Prior to Visit  Medication Sig Dispense Refill  . DICLOFENAC PO Take by mouth.    . metoprolol succinate (TOPROL-XL) 25 MG 24 hr tablet Take 25 mg by mouth daily.    . pantoprazole (PROTONIX) 40 MG tablet Take 40 mg by mouth 2 (two) times daily as needed.     . sertraline (ZOLOFT) 50 MG tablet Take 50 mg by mouth daily.     Marland Kitchen tiZANidine (ZANAFLEX) 2 MG tablet Take 2 mg by mouth 3 times/day as needed-between meals & bedtime.     . traMADol  (ULTRAM) 50  MG tablet Take 1 tablet (50 mg total) by mouth as needed. (Patient taking differently: Take 50 mg by mouth 2 (two) times daily as needed for moderate pain. ) 40 tablet 1  . Vitamin D, Ergocalciferol, (DRISDOL) 50000 units CAPS capsule Take 50,000 Units by mouth every 7 (seven) days.    Marland Kitchen zolpidem (AMBIEN) 10 MG tablet Take 10 mg by mouth at bedtime.      No current facility-administered medications on file prior to visit.     Objective:     Vitals:   11/11/18 0820  BP: (!) 162/90  Pulse: 71              Physical examination General NAD, Conversant  HEENT Atraumatic; Op clear with mmm.  Normo-cephalic. Pupils reactive. Anicteric sclerae  Thyroid/Neck Smooth without nodularity or enlargement. Normal ROM.  Neck Supple.  Skin No rashes, lesions or ulceration. Normal palpated skin turgor. No nodularity.  Breasts: No masses or discharge.  Symmetric.  No axillary adenopathy.  Lungs: Clear to auscultation.No rales or wheezes. Normal Respiratory effort, no retractions.  Heart: NSR.  No murmurs or rubs appreciated. No periferal edema  Abdomen: Soft.  Non-tender.  No masses.  No HSM. No hernia  Extremities: Moves all appropriately.  Normal ROM for age. No lymphadenopathy.  Neuro: Oriented to PPT.  Normal mood. Normal affect.     Pelvic:   Vulva:  Mild appearance of chronic irritation external aspect of the labia bilaterally.  Vagina: No lesions or abnormalities noted.  Support: Normal pelvic support.  Urethra No masses tenderness or scarring.  Meatus Normal size without lesions or prolapse.  Cervix: Surgically absent   Anus: Normal exam.  No lesions.  Perineum: Normal exam.  No lesions.        Bimanual   Uterus: Surgically absent   Adnexae: No masses.  Non-tender to palpation.  Cul-de-sac: Negative for abnormality.     Assessment:    [id number] Patient Active Problem List   Diagnosis Date Noted  . Synovitis of left knee 10/21/2017  . Primary localized osteoarthritis of  left knee 03/16/2017  . Surgical menopause, symptomatic 01/26/2017  . Status post abdominal hysterectomy and left salpingo-oophorectomy 01/26/2017  . S/P TAH (total abdominal hysterectomy) 01/20/2017  . 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 Primary localized osteoarthritis of right knee 07/07/2016  . Abnormal hepatitis serology 04/10/2016  . Polyarthralgia 04/03/2016  . Anemia 10/23/2015     1. Encounter for annual physical exam   2. Screening for breast cancer   3. Vulvar irritation     Vulvar irritation likely secondary to chronic use of pads and urine loss.   Plan:            1.  Basic Screening Recommendations The basic screening recommendations for asymptomatic women were discussed with the patient during her visit.  The age-appropriate recommendations were discussed with her and the rational for the tests reviewed.  When I am informed by the patient that another primary care physician has previously obtained the age-appropriate tests and they are up-to-date, only outstanding tests are ordered and referrals given as necessary.  Abnormal results of tests will be discussed with her when all of her results are completed. Mammogram ordered 2.  Chronic vulvitis discussed-vulvar care reviewed-will prescribe course of triamcinolone cream to see if this helps at all. Orders Orders Placed This Encounter  Procedures  . MM 3D SCREEN BREAST BILATERAL     Meds ordered this encounter  Medications  . triamcinolone cream (KENALOG) 0.1 %  F/U  Return in about 1 year (around 11/11/2019) for Annual Physical.  Finis Bud, M.D. 11/11/2018 9:00 AM

## 2018-11-14 ENCOUNTER — Other Ambulatory Visit: Payer: Self-pay | Admitting: Surgical

## 2018-11-14 MED ORDER — TRIAMCINOLONE ACETONIDE 0.5 % EX OINT: 1.0000 "application " | TOPICAL_OINTMENT | Freq: Every day | CUTANEOUS | 0 refills | Status: DC

## 2018-11-21 ENCOUNTER — Ambulatory Visit: Payer: Medicare Other

## 2018-12-20 ENCOUNTER — Ambulatory Visit
Admission: RE | Admit: 2018-12-20 | Discharge: 2018-12-20 | Disposition: A | Discharge status: Home / Self Care | Payer: Medicare Other | Source: Ambulatory Visit | Attending: Student | Admitting: Student

## 2018-12-20 ENCOUNTER — Other Ambulatory Visit: Payer: Self-pay

## 2018-12-20 DIAGNOSIS — M5412 Radiculopathy, cervical region: Secondary | ICD-10-CM | POA: Diagnosis present

## 2018-12-20 DIAGNOSIS — M5414 Radiculopathy, thoracic region: Secondary | ICD-10-CM | POA: Insufficient documentation

## 2019-01-11 DIAGNOSIS — M79602 Pain in left arm: Secondary | ICD-10-CM | POA: Insufficient documentation

## 2019-05-26 ENCOUNTER — Other Ambulatory Visit
Admission: RE | Admit: 2019-05-26 | Discharge: 2019-05-26 | Disposition: A | Discharge status: Home / Self Care | Payer: Medicare Other | Source: Ambulatory Visit | Attending: Internal Medicine | Admitting: Internal Medicine

## 2019-05-26 DIAGNOSIS — Z01812 Encounter for preprocedural laboratory examination: Secondary | ICD-10-CM | POA: Insufficient documentation

## 2019-05-26 DIAGNOSIS — Z20828 Contact with and (suspected) exposure to other viral communicable diseases: Secondary | ICD-10-CM | POA: Insufficient documentation

## 2019-05-27 LAB — SARS CORONAVIRUS 2 (TAT 6-24 HRS): SARS Coronavirus 2: NEGATIVE

## 2019-05-29 ENCOUNTER — Encounter: Payer: Self-pay | Admitting: *Deleted

## 2019-05-30 ENCOUNTER — Ambulatory Visit: Payer: Medicare Other | Admitting: Anesthesiology

## 2019-05-30 ENCOUNTER — Other Ambulatory Visit: Payer: Self-pay | Admitting: Internal Medicine

## 2019-05-30 ENCOUNTER — Encounter: Payer: Self-pay | Admitting: Anesthesiology

## 2019-05-30 ENCOUNTER — Encounter
Admission: RE | Disposition: A | Discharge status: Home / Self Care | Payer: Self-pay | Source: Home / Self Care | Attending: Internal Medicine

## 2019-05-30 ENCOUNTER — Ambulatory Visit
Admission: RE | Admit: 2019-05-30 | Discharge: 2019-05-30 | Disposition: A | Discharge status: Home / Self Care | Payer: Medicare Other | Attending: Internal Medicine | Admitting: Internal Medicine

## 2019-05-30 ENCOUNTER — Other Ambulatory Visit: Payer: Self-pay

## 2019-05-30 DIAGNOSIS — F419 Anxiety disorder, unspecified: Secondary | ICD-10-CM | POA: Insufficient documentation

## 2019-05-30 DIAGNOSIS — Z87891 Personal history of nicotine dependence: Secondary | ICD-10-CM | POA: Insufficient documentation

## 2019-05-30 DIAGNOSIS — K228 Other specified diseases of esophagus: Secondary | ICD-10-CM | POA: Insufficient documentation

## 2019-05-30 DIAGNOSIS — M1990 Unspecified osteoarthritis, unspecified site: Secondary | ICD-10-CM | POA: Diagnosis not present

## 2019-05-30 DIAGNOSIS — Z79899 Other long term (current) drug therapy: Secondary | ICD-10-CM | POA: Diagnosis not present

## 2019-05-30 DIAGNOSIS — I10 Essential (primary) hypertension: Secondary | ICD-10-CM | POA: Diagnosis not present

## 2019-05-30 DIAGNOSIS — K449 Diaphragmatic hernia without obstruction or gangrene: Secondary | ICD-10-CM | POA: Insufficient documentation

## 2019-05-30 DIAGNOSIS — R1012 Left upper quadrant pain: Secondary | ICD-10-CM

## 2019-05-30 DIAGNOSIS — F329 Major depressive disorder, single episode, unspecified: Secondary | ICD-10-CM | POA: Diagnosis not present

## 2019-05-30 DIAGNOSIS — K219 Gastro-esophageal reflux disease without esophagitis: Secondary | ICD-10-CM | POA: Insufficient documentation

## 2019-05-30 DIAGNOSIS — K317 Polyp of stomach and duodenum: Secondary | ICD-10-CM | POA: Insufficient documentation

## 2019-05-30 HISTORY — PX: ESOPHAGOGASTRODUODENOSCOPY (EGD) WITH PROPOFOL: SHX5813

## 2019-05-30 SURGERY — ESOPHAGOGASTRODUODENOSCOPY (EGD) WITH PROPOFOL
Anesthesia: General

## 2019-05-30 MED ORDER — LIDOCAINE HCL (PF) 2 % IJ SOLN
INTRAMUSCULAR | Status: DC | PRN
  Administered 2019-05-30: 100 mg via INTRADERMAL

## 2019-05-30 MED ORDER — PROPOFOL 10 MG/ML IV BOLUS
INTRAVENOUS | Status: DC | PRN
  Administered 2019-05-30: 80 mg via INTRAVENOUS

## 2019-05-30 MED ORDER — SODIUM CHLORIDE 0.9 % IV SOLN
INTRAVENOUS | Status: DC
  Administered 2019-05-30: 1000 mL via INTRAVENOUS

## 2019-05-30 MED ORDER — PROPOFOL 500 MG/50ML IV EMUL
INTRAVENOUS | Status: DC | PRN
  Administered 2019-05-30: 150 ug/kg/min via INTRAVENOUS

## 2019-05-30 NOTE — Anesthesia Postprocedure Evaluation (Signed)
Anesthesia Post Note  Patient: Caitlyn [name]  Procedure(s) Performed: ESOPHAGOGASTRODUODENOSCOPY (EGD) WITH PROPOFOL (N/A )  Patient location during evaluation: Endoscopy Anesthesia Type: General Level of consciousness: awake and alert Pain management: pain level controlled Vital Signs Assessment: post-procedure vital signs reviewed and stable Respiratory status: spontaneous breathing, nonlabored ventilation, respiratory function stable and patient connected to nasal cannula oxygen Cardiovascular status: blood pressure returned to baseline and stable Postop Assessment: no apparent nausea or vomiting Anesthetic complications: no     Last Vitals:  Vitals:   05/30/19 1043 05/30/19 1053  BP: (!) 164/100 (!) 165/107  Pulse: (!) 58 (!) 56  Resp: (!) 21 14  Temp:    SpO2: 100% 100%    Last Pain:  Vitals:   05/30/19 1033  TempSrc:   PainSc: 0-No pain                 Rivaldo Hineman S

## 2019-05-30 NOTE — Op Note (Signed)
[facility] Gastroenterology Patient Name: Caitlyn [name] Procedure Date: 05/30/2019 10:04 AM MRN: [id number] Account #: [id number] Date of Birth: 03/17/1966 Admit Type: Outpatient Age: 53 Room: [facility] ENDO ROOM 2 Gender: Female Note Status: Finalized Procedure:            Upper GI endoscopy Indications:          Abdominal pain in the left upper quadrant, Suspected                        esophageal reflux, Failure to respond to medical                        treatment Providers:            Benay Pike. [place] MD, MD Medicines:            Propofol per Anesthesia Complications:        No immediate complications. Procedure:            Pre-Anesthesia Assessment:                       - The risks and benefits of the procedure and the                        sedation options and risks were discussed with the                        patient. All questions were answered and informed                        consent was obtained.                       - Patient identification and proposed procedure were                        verified prior to the procedure by the nurse. The                        procedure was verified in the procedure room.                       - ASA Grade Assessment: III - A patient with severe                        systemic disease.                       - After reviewing the risks and benefits, the patient                        was deemed in satisfactory condition to undergo the                        procedure.                       After obtaining informed consent, the endoscope was                        passed under direct vision. Throughout the procedure,  the patient's blood pressure, pulse, and oxygen                        saturations were monitored continuously. The Endoscope                        was introduced through the mouth, and advanced to the                        third part of duodenum. The upper GI  endoscopy was                        accomplished without difficulty. The patient tolerated                        the procedure well. Findings:      Diffuse mild mucosal variance characterized by discoloration, a       decreased vascular pattern and vertical lines was found in the entire       esophagus. Biopsies were obtained from the proximal and distal esophagus       with cold forceps for histology of suspected eosinophilic esophagitis.      A single 7 mm mucosal nodule with a localized distribution was found at       the gastroesophageal junction. Biopsies were taken with a cold forceps       for histology.      A 1 cm hiatal hernia was present.      A few sessile polyps with no stigmata of recent bleeding were found in       the gastric body. Biopsies were taken with a cold forceps for histology.      The examined duodenum was normal.      The exam was otherwise without abnormality. Impression:           - Esophageal mucosal variant. Biopsied.                       - Mucosal nodule found in the esophagus. Biopsied.                       - 1 cm hiatal hernia.                       - A few gastric polyps. Biopsied.                       - Normal examined duodenum.                       - The examination was otherwise normal. Recommendation:       - Patient has a contact number available for                        emergencies. The signs and symptoms of potential                        delayed complications were discussed with the patient.                        Return to normal activities tomorrow. Written discharge  instructions were provided to the patient.                       - Resume previous diet.                       - Continue present medications.                       - No aspirin, ibuprofen, naproxen, or other                        non-steroidal anti-inflammatory drugs.                       - Await pathology results.                       -  Perform a CT scan (computed tomography) of abdomen                        with contrast and pelvis with contrast at appointment                        to be scheduled.                       - Return to physician assistant in 2 weeks.                       - The findings and recommendations were discussed with                        the patient. Procedure Code(s):    --- Professional ---                       [phone], Esophagogastroduodenoscopy, flexible, transoral;                        with biopsy, single or multiple Diagnosis Code(s):    --- Professional ---                       K22.8, Other specified diseases of esophagus                       K44.9, Diaphragmatic hernia without obstruction or                        gangrene                       K31.7, Polyp of stomach and duodenum                       R10.12, Left upper quadrant pain CPT copyright 2019 [employer]. All rights reserved. The codes documented in this report are preliminary and upon coder review may  be revised to meet current compliance requirements. Efrain Sella MD, MD 05/30/2019 10:40:01 AM This report has been signed electronically. Number of Addenda: 0 Note Initiated On: 05/30/2019 10:04 AM Estimated Blood Loss: Estimated blood loss was minimal.      [facility]

## 2019-05-30 NOTE — Anesthesia Preprocedure Evaluation (Signed)
Anesthesia Evaluation  Patient identified by MRN, date of birth, ID band Patient awake    Reviewed: Allergy & Precautions, NPO status , Patient's Chart, lab work & pertinent test results, reviewed documented beta blocker date and time   Airway Mallampati: II  TM Distance: >3 FB     Dental  (+) Chipped   Pulmonary former smoker,           Cardiovascular hypertension, Pt. on medications and Pt. on home beta blockers      Neuro/Psych PSYCHIATRIC DISORDERS Anxiety Depression    GI/Hepatic GERD  ,  Endo/Other    Renal/GU      Musculoskeletal  (+) Arthritis ,   Abdominal   Peds  Hematology  (+) anemia ,   Anesthesia Other Findings EKG ok from 1 yr ago.  Reproductive/Obstetrics                             Anesthesia Physical Anesthesia Plan  ASA: II  Anesthesia Plan: General   Post-op Pain Management:    Induction: Intravenous  PONV Risk Score and Plan:   Airway Management Planned:   Additional Equipment:   Intra-op Plan:   Post-operative Plan:   Informed Consent: I have reviewed the patients History and Physical, chart, labs and discussed the procedure including the risks, benefits and alternatives for the proposed anesthesia with the patient or authorized representative who has indicated his/her understanding and acceptance.       Plan Discussed with: CRNA  Anesthesia Plan Comments:         Anesthesia Quick Evaluation

## 2019-05-30 NOTE — Anesthesia Post-op Follow-up Note (Signed)
Anesthesia QCDR form completed.        

## 2019-05-30 NOTE — H&P (Signed)
Outpatient short stay form Pre-procedure 05/30/2019 8:11 AM Caitlyn [name] K. Alice Reichert, M.D.  Primary Physician: Theotis Burrow, M.D.  Reason for visit:  GERD, LUQ pain  History of present illness:  Patient with GERD and LUQ pain, negative H Pylori test. No dysphagia.    Current Facility-Administered Medications:  .  triamcinolone cream (KENALOG) 0.1 %, , Topical, QHS, Evans, Nyoka Lint, MD  Current Outpatient Medications:  .  gabapentin (NEURONTIN) 100 MG capsule, Take 100 mg by mouth 2 (two) times daily., Disp: , Rfl:  .  DICLOFENAC PO, Take by mouth., Disp: , Rfl:  .  metoprolol succinate (TOPROL-XL) 25 MG 24 hr tablet, Take 25 mg by mouth daily., Disp: , Rfl:  .  pantoprazole (PROTONIX) 40 MG tablet, Take 40 mg by mouth 2 (two) times daily as needed. , Disp: , Rfl:  .  sertraline (ZOLOFT) 50 MG tablet, Take 50 mg by mouth daily. , Disp: , Rfl:  .  tiZANidine (ZANAFLEX) 2 MG tablet, Take 2 mg by mouth 3 times/day as needed-between meals & bedtime. , Disp: , Rfl:  .  traMADol (ULTRAM) 50 MG tablet, Take 1 tablet (50 mg total) by mouth as needed. (Patient taking differently: Take 50 mg by mouth 2 (two) times daily as needed for moderate pain. ), Disp: 40 tablet, Rfl: 1 .  triamcinolone ointment (KENALOG) 0.5 %, Apply 1 application topically at bedtime., Disp: 30 g, Rfl: 0 .  Vitamin D, Ergocalciferol, (DRISDOL) 50000 units CAPS capsule, Take 50,000 Units by mouth every 7 (seven) days., Disp: , Rfl:  .  zolpidem (AMBIEN) 10 MG tablet, Take 10 mg by mouth at bedtime. , Disp: , Rfl:   No medications prior to admission.     No Known Allergies   Past Medical History:  Diagnosis Date  . Anemia   . Anemia   . Anxiety   . Arthritis   . Depression   . Dyspepsia   . GERD (gastroesophageal reflux disease)   . Heart burn   . Hypertension   . Irregular menstrual cycle     Review of systems:  Otherwise negative.    Physical Exam  Gen: Alert, oriented. Appears stated age.   HEENT: [place]/AT. PERRLA. Lungs: CTA, no wheezes. CV: RR nl S1, S2. Abd: soft, benign, no masses. BS+ Ext: No edema. Pulses 2+    Planned procedures: Proceed with EGD. The patient understands the nature of the planned procedure, indications, risks, alternatives and potential complications including but not limited to bleeding, infection, perforation, damage to internal organs and possible oversedation/side effects from anesthesia. The patient agrees and gives consent to proceed.  Please refer to procedure notes for findings, recommendations and patient disposition/instructions.     Vitoria Conyer K. Alice Reichert, M.D. Gastroenterology 05/30/2019  8:11 AM

## 2019-05-30 NOTE — Transfer of Care (Signed)
Immediate Anesthesia Transfer of Care Note  Patient: Caitlyn [name]  Procedure(s) Performed: ESOPHAGOGASTRODUODENOSCOPY (EGD) WITH PROPOFOL (N/A )  Patient Location: PACU  Anesthesia Type:General  Level of Consciousness: awake and sedated  Airway & Oxygen Therapy: Patient Spontanous Breathing and Patient connected to nasal cannula oxygen  Post-op Assessment: Report given to RN and Post -op Vital signs reviewed and stable  Post vital signs: Reviewed and stable  Last Vitals:  Vitals Value Taken Time  BP    Temp    Pulse 59 05/30/19 1033  Resp 23 05/30/19 1033  SpO2 87 % 05/30/19 1033  Vitals shown include unvalidated device data.  Last Pain:  Vitals:   05/30/19 0950  TempSrc: Tympanic  PainSc: 0-No pain         Complications: No apparent anesthesia complications

## 2019-05-30 NOTE — Interval H&P Note (Signed)
History and Physical Interval Note:  05/30/2019 9:32 AM  Caitlyn [name]  has presented today for surgery, with the diagnosis of GERD LUQ.  The various methods of treatment have been discussed with the patient and family. After consideration of risks, benefits and other options for treatment, the patient has consented to  Procedure(s): ESOPHAGOGASTRODUODENOSCOPY (EGD) WITH PROPOFOL (N/A) as a surgical intervention.  The patient's history has been reviewed, patient examined, no change in status, stable for surgery.  I have reviewed the patient's chart and labs.  Questions were answered to the patient's satisfaction.     [place], [place]

## 2019-05-31 ENCOUNTER — Ambulatory Visit: Payer: Medicare Other

## 2019-05-31 ENCOUNTER — Encounter: Payer: Self-pay | Admitting: Internal Medicine

## 2019-05-31 LAB — SURGICAL PATHOLOGY

## 2019-06-09 ENCOUNTER — Other Ambulatory Visit: Payer: Self-pay

## 2019-06-09 ENCOUNTER — Ambulatory Visit
Admission: RE | Admit: 2019-06-09 | Discharge: 2019-06-09 | Disposition: A | Discharge status: Home / Self Care | Payer: Medicare Other | Source: Ambulatory Visit | Attending: Internal Medicine | Admitting: Internal Medicine

## 2019-06-09 DIAGNOSIS — R1012 Left upper quadrant pain: Secondary | ICD-10-CM | POA: Diagnosis not present

## 2019-06-09 MED ORDER — IOHEXOL 300 MG/ML  SOLN
100.0000 mL | Freq: Once | INTRAMUSCULAR | Status: AC | PRN
  Administered 2019-06-09: 12:00:00 100 mL via INTRAVENOUS

## 2019-08-10 DIAGNOSIS — M5412 Radiculopathy, cervical region: Secondary | ICD-10-CM | POA: Insufficient documentation

## 2019-08-10 DIAGNOSIS — M7552 Bursitis of left shoulder: Secondary | ICD-10-CM | POA: Insufficient documentation

## 2019-08-29 ENCOUNTER — Other Ambulatory Visit: Payer: Self-pay | Admitting: Family Medicine

## 2019-08-29 DIAGNOSIS — Z1231 Encounter for screening mammogram for malignant neoplasm of breast: Secondary | ICD-10-CM

## 2019-09-08 ENCOUNTER — Other Ambulatory Visit: Payer: Self-pay

## 2019-09-08 ENCOUNTER — Emergency Department: Payer: Medicare Other

## 2019-09-08 ENCOUNTER — Emergency Department
Admission: EM | Admit: 2019-09-08 | Discharge: 2019-09-08 | Disposition: A | Discharge status: Home / Self Care | Payer: Medicare Other | Attending: Emergency Medicine | Admitting: Emergency Medicine

## 2019-09-08 DIAGNOSIS — M5489 Other dorsalgia: Secondary | ICD-10-CM | POA: Diagnosis present

## 2019-09-08 DIAGNOSIS — Z87891 Personal history of nicotine dependence: Secondary | ICD-10-CM | POA: Diagnosis not present

## 2019-09-08 DIAGNOSIS — M5412 Radiculopathy, cervical region: Secondary | ICD-10-CM | POA: Insufficient documentation

## 2019-09-08 DIAGNOSIS — I10 Essential (primary) hypertension: Secondary | ICD-10-CM | POA: Insufficient documentation

## 2019-09-08 DIAGNOSIS — Z79899 Other long term (current) drug therapy: Secondary | ICD-10-CM | POA: Insufficient documentation

## 2019-09-08 LAB — COMPREHENSIVE METABOLIC PANEL
ALT: 48 U/L — ABNORMAL HIGH (ref 0–44)
AST: 31 U/L (ref 15–41)
Albumin: 3.9 g/dL (ref 3.5–5.0)
Alkaline Phosphatase: 68 U/L (ref 38–126)
Anion gap: 8 (ref 5–15)
BUN: 11 mg/dL (ref 6–20)
CO2: 28 mmol/L (ref 22–32)
Calcium: 9 mg/dL (ref 8.9–10.3)
Chloride: 102 mmol/L (ref 98–111)
Creatinine, Ser: 0.69 mg/dL (ref 0.44–1.00)
GFR calc Af Amer: >60 mL/min (ref >60)
GFR calc non Af Amer: >60 mL/min (ref >60)
Glucose, Bld: 210 mg/dL — ABNORMAL HIGH (ref 70–99)
Potassium: 3.6 mmol/L (ref 3.5–5.1)
Sodium: 138 mmol/L (ref 135–145)
Total Bilirubin: 0.6 mg/dL (ref 0.3–1.2)
Total Protein: 7.3 g/dL (ref 6.5–8.1)

## 2019-09-08 LAB — CBC WITH DIFFERENTIAL/PLATELET
Abs Immature Granulocytes: 0.05 10*3/uL (ref 0.00–0.07)
Basophils Absolute: 0.1 10*3/uL (ref 0.0–0.1)
Basophils Relative: 1 %
Eosinophils Absolute: 0.2 10*3/uL (ref 0.0–0.5)
Eosinophils Relative: 2 %
HCT: 39.2 % (ref 36.0–46.0)
Hemoglobin: 13.2 g/dL (ref 12.0–15.0)
Immature Granulocytes: 1 %
Lymphocytes Relative: 27 %
Lymphs Abs: 2.3 10*3/uL (ref 0.7–4.0)
MCH: 27.2 pg (ref 26.0–34.0)
MCHC: 33.7 g/dL (ref 30.0–36.0)
MCV: 80.7 fL (ref 80.0–100.0)
Monocytes Absolute: 0.6 10*3/uL (ref 0.1–1.0)
Monocytes Relative: 7 %
Neutro Abs: 5.1 10*3/uL (ref 1.7–7.7)
Neutrophils Relative %: 62 %
Platelets: 216 10*3/uL (ref 150–400)
RBC: 4.86 MIL/uL (ref 3.87–5.11)
RDW: 12.2 % (ref 11.5–15.5)
WBC: 8.2 10*3/uL (ref 4.0–10.5)
nRBC: 0 % (ref 0.0–0.2)

## 2019-09-08 LAB — TROPONIN I (HIGH SENSITIVITY): Troponin I (High Sensitivity): 5 ng/L (ref <18)

## 2019-09-08 MED ORDER — METHOCARBAMOL 500 MG PO TABS: 500.0000 mg | ORAL_TABLET | Freq: Four times a day (QID) | ORAL | 0 refills | Status: DC | PRN

## 2019-09-08 MED ORDER — OXYCODONE-ACETAMINOPHEN 5-325 MG PO TABS: 1.0000 | ORAL_TABLET | Freq: Four times a day (QID) | ORAL | 0 refills | Status: AC | PRN

## 2019-09-08 MED ORDER — DEXAMETHASONE SODIUM PHOSPHATE 10 MG/ML IJ SOLN
10.0000 mg | Freq: Once | INTRAMUSCULAR | Status: AC
  Administered 2019-09-08: 10 mg via INTRAMUSCULAR
  Filled 2019-09-08: qty 1

## 2019-09-08 MED ORDER — OXYCODONE-ACETAMINOPHEN 5-325 MG PO TABS
1.0000 | ORAL_TABLET | Freq: Once | ORAL | Status: AC
  Administered 2019-09-08: 1 via ORAL
  Filled 2019-09-08: qty 1

## 2019-09-08 NOTE — ED Provider Notes (Signed)
[facility] Emergency Department Provider Note  ____________________________________________   First MD Initiated Contact with Patient 09/08/19 1316     (approximate)  I have reviewed the triage vital signs and the nursing notes.   HISTORY  Chief Complaint Back Pain Per Spanish interpreter  HPI Caitlyn [name] is a 54 y.o. female presents to the ED with complaint of left shoulder pain for approximately 2 weeks.  Patient denies any history of injury.  Patient states that the pain radiates from her neck down into her shoulder and completely down her arm.  She states that occasionally it goes over to her collarbone.  Patient also complains of pain radiating up into her neck.  She denies any past cardiac history, shortness of breath, diaphoresis, nausea or indigestion.  Patient has taken tramadol which she already has at home due to her knee replacements which has not helped with her pain.  Patient also has history of being Covid positive on 08/22/2019 and finished quarantine on 09/04/2019.     Past Medical History:  Diagnosis Date  . Anemia   . Anemia   . Anxiety   . Arthritis   . Depression   . Dyspepsia   . GERD (gastroesophageal reflux disease)   . Heart burn   . Hypertension   . Irregular menstrual cycle     Patient Active Problem List   Diagnosis Date Noted  . Synovitis of left knee 10/21/2017  . Primary localized osteoarthritis of left knee 03/16/2017  . Surgical menopause, symptomatic 01/26/2017  . Status post abdominal hysterectomy and left salpingo-oophorectomy 01/26/2017  . S/P TAH (total abdominal hysterectomy) 01/20/2017  . 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 Primary localized osteoarthritis of right knee 07/07/2016  . Abnormal hepatitis serology 04/10/2016  . Polyarthralgia 04/03/2016  . Anemia 10/23/2015    Past Surgical History:  Procedure Laterality Date  . ABDOMINAL HYSTERECTOMY Left 01/18/2017   Procedure: HYSTERECTOMY ABDOMINAL  WITH LEFT SALPINGO OOPHERECTOMY;  Surgeon: Brayton Mars, MD;  Location: [facility] ORS;  Service: Gynecology;  Laterality: Left;  . CHOLECYSTECTOMY    . CHOLECYSTECTOMY, LAPAROSCOPIC    . COLONOSCOPY WITH PROPOFOL N/A 11/04/2017   Procedure: COLONOSCOPY WITH PROPOFOL;  Surgeon: Lollie Sails, MD;  Location: [facility] ENDOSCOPY;  Service: Endoscopy;  Laterality: N/A;  . ESOPHAGOGASTRODUODENOSCOPY N/A 11/04/2017   Procedure: ESOPHAGOGASTRODUODENOSCOPY (EGD);  Surgeon: Lollie Sails, MD;  Location: [facility] ENDOSCOPY;  Service: Endoscopy;  Laterality: N/A;  . ESOPHAGOGASTRODUODENOSCOPY    . ESOPHAGOGASTRODUODENOSCOPY (EGD) WITH PROPOFOL N/A 05/30/2019   Procedure: ESOPHAGOGASTRODUODENOSCOPY (EGD) WITH PROPOFOL;  Surgeon: Toledo, Benay Pike, MD;  Location: [facility] ENDOSCOPY;  Service: Gastroenterology;  Laterality: N/A;  . JOINT REPLACEMENT    . KNEE ARTHROSCOPY Left 12/03/2016   Procedure: ARTHROSCOPY KNEE, partial medial menisectomy;  Surgeon: Hessie Knows, MD;  Location: [facility] ORS;  Service: Orthopedics;  Laterality: Left;  . KNEE ARTHROTOMY Left 10/21/2017   Procedure: KNEE ARTHROTOMY LEFT LATERAL RELEASE MEDIAL RETINACULART REPAIR POLY EXCHANGE;  Surgeon: Hessie Knows, MD;  Location: [facility] ORS;  Service: Orthopedics;  Laterality: Left;  . KNEE CLOSED REDUCTION Left 04/15/2017   Procedure: CLOSED MANIPULATION KNEE;  Surgeon: Hessie Knows, MD;  Location: [facility] ORS;  Service: Orthopedics;  Laterality: Left;  . LAPAROSCOPIC OVARIAN CYSTECTOMY Right   . MENISCECTOMY Right   . TOTAL KNEE ARTHROPLASTY Right 07/07/2016   Procedure: TOTAL KNEE ARTHROPLASTY;  Surgeon: Hessie Knows, MD;  Location: [facility] ORS;  Service: Orthopedics;  Laterality: Right;  . TOTAL KNEE ARTHROPLASTY Left 03/16/2017   Procedure:  TOTAL KNEE ARTHROPLASTY;  Surgeon: Hessie Knows, MD;  Location: [facility] ORS;  Service: Orthopedics;  Laterality: Left;  . TUBAL LIGATION      Prior to Admission medications   Medication Sig Start Date End Date  Taking? Authorizing Provider  DICLOFENAC PO Take by mouth.    [provider]  gabapentin (NEURONTIN) 100 MG capsule Take 100 mg by mouth 2 (two) times daily.    [provider]  methocarbamol (ROBAXIN) 500 MG tablet Take 1 tablet (500 mg total) by mouth every 6 (six) hours as needed for muscle spasms. 09/08/19   Johnn Hai, PA-C  metoprolol succinate (TOPROL-XL) 25 MG 24 hr tablet Take 25 mg by mouth daily. 10/09/18   [provider]  oxyCODONE-acetaminophen (PERCOCET) 5-325 MG tablet Take 1 tablet by mouth every 6 (six) hours as needed for severe pain. 09/08/19 09/07/20  Johnn Hai, PA-C  pantoprazole (PROTONIX) 40 MG tablet Take 40 mg by mouth 2 (two) times daily as needed.     [provider]  sertraline (ZOLOFT) 50 MG tablet Take 50 mg by mouth daily.     [provider]  tiZANidine (ZANAFLEX) 2 MG tablet Take 2 mg by mouth 3 times/day as needed-between meals & bedtime.     [provider]  traMADol (ULTRAM) 50 MG tablet Take 1 tablet (50 mg total) by mouth as needed. Patient taking differently: Take 50 mg by mouth 2 (two) times daily as needed for moderate pain.  03/18/17   Reche Dixon, PA-C  triamcinolone ointment (KENALOG) 0.5 % Apply 1 application topically at bedtime. 11/14/18   Harlin Heys, MD  Vitamin D, Ergocalciferol, (DRISDOL) 50000 units CAPS capsule Take 50,000 Units by mouth every 7 (seven) days.    [provider]  zolpidem (AMBIEN) 10 MG tablet Take 10 mg by mouth at bedtime.     [provider]    Allergies Patient has no known allergies.  Family History  Problem Relation Age of Onset  . Osteoarthritis Mother   . Heart failure Father   . Diabetes Father   . Hypertension Father   . Osteoarthritis Sister   . Breast cancer Neg Hx   . Ovarian cancer Neg Hx   . Colon cancer Neg Hx     Social History Social History   Tobacco Use  . Smoking status: Former Smoker    Packs/day: 0.50     Types: Cigarettes    Quit date: 03/01/2015    Years since quitting: 4.5  . Smokeless tobacco: Never Used  Substance Use Topics  . Alcohol use: Yes    Alcohol/week: 0.0 standard drinks    Comment: occasionally  . Drug use: No    Review of Systems Constitutional: No fever/chills Eyes: No visual changes. ENT: No sore throat.  Negative for ear pain. Cardiovascular: Denies chest pain. Respiratory: Denies shortness of breath.  Negative for cough. Gastrointestinal: No abdominal pain.  No nausea, no vomiting.  Musculoskeletal: For left shoulder pain with radiculopathy symptoms to the left upper extremity. Skin: Negative for rash. Neurological: Negative for headaches, focal weakness or numbness. ___________________________________________   PHYSICAL EXAM:  VITAL SIGNS: ED Triage Vitals  Enc Vitals Group     BP 09/08/19 1222 (!) 171/89     Pulse Rate 09/08/19 1222 67     Resp 09/08/19 1222 17     Temp 09/08/19 1221 98.5 F (36.9 C)     Temp Source 09/08/19 1221 Oral     SpO2 09/08/19  1222 97 %     Weight 09/08/19 1219 202 lb (91.6 kg)     Height 09/08/19 1219 5\' 4"  (1.626 m)     Head Circumference --      Peak Flow --      Pain Score 09/08/19 1218 10     Pain Loc --      Pain Edu? --      Excl. in [place]? --     Constitutional: Alert and oriented. Well appearing and in no acute distress. Eyes: Conjunctivae are normal.  Head: Atraumatic. Neck: No stridor.  No cervical tenderness on palpation posteriorly.  Range of motion is not restricted. Cardiovascular: Normal rate, regular rhythm. Grossly normal heart sounds.  Good peripheral circulation. Respiratory: Normal respiratory effort.  No retractions. Lungs CTAB. Gastrointestinal: Soft and nontender. No distention.  Musculoskeletal: Examination of left shoulder there is no gross deformity however range of motion is moderate to markedly restricted secondary to discomfort.  Range of motion increases patient's pain and she is unable to  tolerate it.  Patient continues to have a good grip strength but also complains that this increases her pain.  No gross deformity is noted of the upper extremity. Neurologic:  Normal speech and language. No gross focal neurologic deficits are appreciated. No gait instability. Skin:  Skin is warm, dry and intact. No rash noted. Psychiatric: Mood and affect are normal. Speech and behavior are normal.  ____________________________________________   LABS (all labs ordered are listed, but only abnormal results are displayed)  Labs Reviewed  COMPREHENSIVE METABOLIC PANEL - Abnormal; Notable for the following components:      Result Value   Glucose, Bld 210 (*)    ALT 48 (*)    All other components within normal limits  CBC WITH DIFFERENTIAL/PLATELET  TROPONIN I (HIGH SENSITIVITY)  TROPONIN I (HIGH SENSITIVITY)   ____________________________________________  EKG  EKG was reviewed by MD.  Normal sinus rhythm. Ventricular rate 74, PR interval 126, QRS duration 80. ____________________________________________  RADIOLOGY  Official radiology report(s): DG Cervical Spine 2-3 Views  Result Date: 09/08/2019 CLINICAL DATA:  Cervicalgia EXAM: CERVICAL SPINE - 2-3 VIEW COMPARISON:  November 28, 2014 FINDINGS: Frontal, lateral, and open-mouth odontoid images were obtained. There is no fracture or spondylolisthesis. Prevertebral soft tissues and predental space regions are normal. There is moderate disc space narrowing at C3-4, C4-5, C5-6, and C6-7. There are prominent anterior osteophytes at C5, C6, and C7 with calcification in the anterior longitudinal ligament at C5-6 and C6-7. There is no erosive change. There is reversal of lordotic curvature.  Lung apices are clear. IMPRESSION: Multilevel osteoarthritic change, similar to prior study. Reversal of lordotic curvature is likely indicative of muscle spasm. No fracture or spondylolisthesis. Electronically Signed   By: Lowella Grip III M.D.   On:  09/08/2019 14:10   DG Shoulder Left  Result Date: 09/08/2019 CLINICAL DATA:  Shoulder pain EXAM: LEFT SHOULDER - 2+ VIEW COMPARISON:  None. FINDINGS: Oblique, Y scapular, and axillary images were obtained. No fracture or dislocation. There is narrowing of the glenohumeral joint. No erosive change. Visualized left lung clear. IMPRESSION: Glenohumeral joint narrowing. Acromioclavicular joint appears unremarkable. No evident fracture or dislocation. Electronically Signed   By: Lowella Grip III M.D.   On: 09/08/2019 14:11    ____________________________________________   PROCEDURES  Procedure(s) performed (including Critical Care):  Procedures  ____________________________________________   INITIAL IMPRESSION / ASSESSMENT AND PLAN / ED COURSE  As part of my medical decision making, I reviewed the following  data within the electronic MEDICAL RECORD NUMBER Notes from prior ED visits and [place] Controlled Substance Database Caitlyn [name] was evaluated in Emergency Department on 09/08/2019 for the symptoms described in the history of present illness. She was evaluated in the context of the global COVID-19 pandemic, which necessitated consideration that the patient might be at risk for infection with the SARS-CoV-2 virus that causes COVID-19. Institutional protocols and algorithms that pertain to the evaluation of patients at risk for COVID-19 are in a state of rapid change based on information released by regulatory bodies including the CDC and federal and state organizations. These policies and algorithms were followed during the patient's care in the ED.  54 year old female presents to the ED with complaint of left shoulder pain radiating up into her neck and down her left arm for the last 2 weeks.  She denies any history of injury.  She has been taking tramadol without any relief.  She denies any chest pain, shortness of breath, history of previous heart disease or indigestion.  Lab work is  reassuring and troponin was 5.  Remainder lab work was reassuring.  Cervical spine x-ray shows multilevel arthritic changes.  Left shoulder no acute bony injury but there is narrowing at the humeral glenoid space.  Patient was made aware that this most likely is coming from her neck as we previously discussed.  Patient states that her PCP did try her on steroids which made no difference.  Patient was given a prescription for Percocet every 6 hours as needed for pain and methocarbamol.  She is encouraged to use ice or heat to her neck as needed for discomfort.  She is aware that she needs to discontinue taking the tramadol while taking the Percocet.  We also discussed following up with her orthopedist.  She states that she has seen Dr. Kyla Balzarine multiple times for surgery.  Patient was discharged and was ambulatory at the time of discharge. ____________________________________________   FINAL CLINICAL IMPRESSION(S) / ED DIAGNOSES  Final diagnoses:  Cervical radiculopathy, acute     ED Discharge Orders         Ordered    oxyCODONE-acetaminophen (PERCOCET) 5-325 MG tablet  Every 6 hours PRN     09/08/19 1442    methocarbamol (ROBAXIN) 500 MG tablet  Every 6 hours PRN     09/08/19 1442           Note:  This document was prepared using Dragon voice recognition software and may include unintentional dictation errors.    Johnn Hai, PA-C 09/08/19 1530    Blake Divine, MD 09/10/19 [id number]

## 2019-09-08 NOTE — ED Triage Notes (Signed)
Pt was covid positive on 12/22 and finished quarantine on 1/4.

## 2019-09-08 NOTE — Discharge Instructions (Addendum)
Follow-up with your orthopedist if continued problems with your neck or left arm.  Discontinue taking tramadol while you are taking the new pain medication.  The pain medication and the muscle relaxant may cause drowsiness and increase your risk for falling.  You may use ice or heat to your neck and shoulder as needed for discomfort.

## 2019-09-08 NOTE — ED Notes (Signed)
See triage note  Presents with left shoulder pain  States pain has been there 2 weeks   States pain radiates from her neck into shoulder and arm    Denies any injury

## 2019-09-08 NOTE — ED Triage Notes (Signed)
Pt comes in POV for neck, arm, and shoulder pain. Pt states it is a really intense pain. Pt states it started around two weeks ago. It was controlled with pain medication (tramadol) but they aren't working anymore. Pt denies chest pain. Pt denies injury.

## 2019-09-16 ENCOUNTER — Emergency Department
Admission: EM | Admit: 2019-09-16 | Discharge: 2019-09-16 | Disposition: A | Discharge status: Home / Self Care | Payer: Medicare Other | Attending: Emergency Medicine | Admitting: Emergency Medicine

## 2019-09-16 ENCOUNTER — Encounter: Payer: Self-pay | Admitting: Emergency Medicine

## 2019-09-16 ENCOUNTER — Other Ambulatory Visit: Payer: Self-pay

## 2019-09-16 DIAGNOSIS — M5412 Radiculopathy, cervical region: Secondary | ICD-10-CM | POA: Insufficient documentation

## 2019-09-16 DIAGNOSIS — Z96652 Presence of left artificial knee joint: Secondary | ICD-10-CM | POA: Diagnosis not present

## 2019-09-16 DIAGNOSIS — M542 Cervicalgia: Secondary | ICD-10-CM | POA: Diagnosis not present

## 2019-09-16 DIAGNOSIS — Z87891 Personal history of nicotine dependence: Secondary | ICD-10-CM | POA: Diagnosis not present

## 2019-09-16 DIAGNOSIS — Z79899 Other long term (current) drug therapy: Secondary | ICD-10-CM | POA: Diagnosis not present

## 2019-09-16 DIAGNOSIS — Z8616 Personal history of COVID-19: Secondary | ICD-10-CM | POA: Insufficient documentation

## 2019-09-16 DIAGNOSIS — Z96651 Presence of right artificial knee joint: Secondary | ICD-10-CM | POA: Diagnosis not present

## 2019-09-16 DIAGNOSIS — M7552 Bursitis of left shoulder: Secondary | ICD-10-CM | POA: Diagnosis not present

## 2019-09-16 DIAGNOSIS — I10 Essential (primary) hypertension: Secondary | ICD-10-CM | POA: Insufficient documentation

## 2019-09-16 DIAGNOSIS — M25512 Pain in left shoulder: Secondary | ICD-10-CM | POA: Diagnosis present

## 2019-09-16 HISTORY — DX: COVID-19: U07.1

## 2019-09-16 MED ORDER — PREDNISONE 10 MG PO TABS: 10.0000 mg | ORAL_TABLET | Freq: Every day | ORAL | 0 refills | Status: DC

## 2019-09-16 MED ORDER — OXYCODONE-ACETAMINOPHEN 5-325 MG PO TABS
1.0000 | ORAL_TABLET | Freq: Once | ORAL | Status: AC
  Administered 2019-09-16: 1 via ORAL
  Filled 2019-09-16: qty 1

## 2019-09-16 MED ORDER — ORPHENADRINE CITRATE 30 MG/ML IJ SOLN
60.0000 mg | Freq: Once | INTRAMUSCULAR | Status: AC
  Administered 2019-09-16: 18:00:00 60 mg via INTRAMUSCULAR
  Filled 2019-09-16: qty 2

## 2019-09-16 MED ORDER — DEXAMETHASONE SODIUM PHOSPHATE 10 MG/ML IJ SOLN
10.0000 mg | Freq: Once | INTRAMUSCULAR | Status: AC
  Administered 2019-09-16: 10 mg via INTRAMUSCULAR
  Filled 2019-09-16: qty 1

## 2019-09-16 MED ORDER — METHOCARBAMOL 500 MG PO TABS: 500.0000 mg | ORAL_TABLET | Freq: Four times a day (QID) | ORAL | 0 refills | Status: DC

## 2019-09-16 NOTE — ED Triage Notes (Signed)
Triage assessment per Spanish interpreter:  Pt c/o left shoulder pain radiating down left arm for several weeks. Pt states it is painful to lift anything.

## 2019-09-16 NOTE — ED Provider Notes (Signed)
[facility] Emergency Department Provider Note  ____________________________________________  Time seen: Approximately 5:30 PM  I have reviewed the triage vital signs and the nursing notes.   HISTORY  Chief Complaint Shoulder Pain    HPI Caitlyn [name] is a 54 y.o. female who presents the emergency department complaining of ongoing pain in her left neck, left shoulder, radicular pain down the left upper extremity.  Patient has a longstanding history of cervical radiculopathy.  She has had imaging including MRI of her cervical spine without any significant stenosis, foraminal or central cord stenosis identified.  Patient is unwilling to pursue injections based off of previous documentation.  Patient has been treated with oral medications including long-term tramadol, intermittent episodes of steroids.  Recently according to documentation, orthopedics declined to continue ongoing narcotic prescriptions.  Patient has been seen by second specialty, rheumatology as well as this department and received prescriptions for tramadol and Percocet.  When discussing patient's chronic problems, patient states that the only thing that works for her is "oxycodone."  Patient states that all other medications are ineffective in relieving her symptoms.  Patient denies any headache, visual changes, fevers or chills, URI symptoms, chest pain, domino pain, nausea or vomiting.  Patient describes the pain as a burning/stinging/stabbing/shooting sensation radiating from the neck into the arm.         Past Medical History:  Diagnosis Date  . Anemia   . Anemia   . Anxiety   . Arthritis   . COVID-19   . Depression   . Dyspepsia   . GERD (gastroesophageal reflux disease)   . Heart burn   . Hypertension   . Irregular menstrual cycle     Patient Active Problem List   Diagnosis Date Noted  . Synovitis of left knee 10/21/2017  . Primary localized osteoarthritis of left knee  03/16/2017  . Surgical menopause, symptomatic 01/26/2017  . Status post abdominal hysterectomy and left salpingo-oophorectomy 01/26/2017  . S/P TAH (total abdominal hysterectomy) 01/20/2017  . 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 Primary localized osteoarthritis of right knee 07/07/2016  . Abnormal hepatitis serology 04/10/2016  . Polyarthralgia 04/03/2016  . Anemia 10/23/2015    Past Surgical History:  Procedure Laterality Date  . ABDOMINAL HYSTERECTOMY Left 01/18/2017   Procedure: HYSTERECTOMY ABDOMINAL WITH LEFT SALPINGO OOPHERECTOMY;  Surgeon: Brayton Mars, MD;  Location: [facility] ORS;  Service: Gynecology;  Laterality: Left;  . CHOLECYSTECTOMY    . CHOLECYSTECTOMY, LAPAROSCOPIC    . COLONOSCOPY WITH PROPOFOL N/A 11/04/2017   Procedure: COLONOSCOPY WITH PROPOFOL;  Surgeon: Lollie Sails, MD;  Location: [facility] ENDOSCOPY;  Service: Endoscopy;  Laterality: N/A;  . ESOPHAGOGASTRODUODENOSCOPY N/A 11/04/2017   Procedure: ESOPHAGOGASTRODUODENOSCOPY (EGD);  Surgeon: Lollie Sails, MD;  Location: [facility] ENDOSCOPY;  Service: Endoscopy;  Laterality: N/A;  . ESOPHAGOGASTRODUODENOSCOPY    . ESOPHAGOGASTRODUODENOSCOPY (EGD) WITH PROPOFOL N/A 05/30/2019   Procedure: ESOPHAGOGASTRODUODENOSCOPY (EGD) WITH PROPOFOL;  Surgeon: Toledo, Benay Pike, MD;  Location: [facility] ENDOSCOPY;  Service: Gastroenterology;  Laterality: N/A;  . JOINT REPLACEMENT    . KNEE ARTHROSCOPY Left 12/03/2016   Procedure: ARTHROSCOPY KNEE, partial medial menisectomy;  Surgeon: Hessie Knows, MD;  Location: [facility] ORS;  Service: Orthopedics;  Laterality: Left;  . KNEE ARTHROTOMY Left 10/21/2017   Procedure: KNEE ARTHROTOMY LEFT LATERAL RELEASE MEDIAL RETINACULART REPAIR POLY EXCHANGE;  Surgeon: Hessie Knows, MD;  Location: [facility] ORS;  Service: Orthopedics;  Laterality: Left;  . KNEE CLOSED REDUCTION Left 04/15/2017   Procedure: CLOSED MANIPULATION KNEE;  Surgeon: Hessie Knows, MD;  Location: [facility] ORS;  Service: Orthopedics;  Laterality:  Left;  . LAPAROSCOPIC OVARIAN CYSTECTOMY Right   . MENISCECTOMY Right   . TOTAL KNEE ARTHROPLASTY Right 07/07/2016   Procedure: TOTAL KNEE ARTHROPLASTY;  Surgeon: Hessie Knows, MD;  Location: [facility] ORS;  Service: Orthopedics;  Laterality: Right;  . TOTAL KNEE ARTHROPLASTY Left 03/16/2017   Procedure: TOTAL KNEE ARTHROPLASTY;  Surgeon: Hessie Knows, MD;  Location: [facility] ORS;  Service: Orthopedics;  Laterality: Left;  . TUBAL LIGATION      Prior to Admission medications   Medication Sig Start Date End Date Taking? Authorizing Provider  DICLOFENAC PO Take by mouth.    [provider]  gabapentin (NEURONTIN) 100 MG capsule Take 100 mg by mouth 2 (two) times daily.    [provider]  methocarbamol (ROBAXIN) 500 MG tablet Take 1 tablet (500 mg total) by mouth 4 (four) times daily. 09/16/19   Jack Mineau, Charline Bills, PA-C  metoprolol succinate (TOPROL-XL) 25 MG 24 hr tablet Take 25 mg by mouth daily. 10/09/18   [provider]  oxyCODONE-acetaminophen (PERCOCET) 5-325 MG tablet Take 1 tablet by mouth every 6 (six) hours as needed for severe pain. 09/08/19 09/07/20  Johnn Hai, PA-C  pantoprazole (PROTONIX) 40 MG tablet Take 40 mg by mouth 2 (two) times daily as needed.     [provider]  predniSONE (DELTASONE) 10 MG tablet Take 1 tablet (10 mg total) by mouth daily. 09/16/19   Vincentina Sollers, Charline Bills, PA-C  sertraline (ZOLOFT) 50 MG tablet Take 50 mg by mouth daily.     [provider]  tiZANidine (ZANAFLEX) 2 MG tablet Take 2 mg by mouth 3 times/day as needed-between meals & bedtime.     [provider]  traMADol (ULTRAM) 50 MG tablet Take 1 tablet (50 mg total) by mouth as needed. Patient taking differently: Take 50 mg by mouth 2 (two) times daily as needed for moderate pain.  03/18/17   Reche Dixon, PA-C  triamcinolone ointment (KENALOG) 0.5 % Apply 1 application topically at bedtime. 11/14/18   Harlin Heys, MD  Vitamin D, Ergocalciferol,  (DRISDOL) 50000 units CAPS capsule Take 50,000 Units by mouth every 7 (seven) days.    [provider]  zolpidem (AMBIEN) 10 MG tablet Take 10 mg by mouth at bedtime.     [provider]    Allergies Patient has no known allergies.  Family History  Problem Relation Age of Onset  . Osteoarthritis Mother   . Heart failure Father   . Diabetes Father   . Hypertension Father   . Osteoarthritis Sister   . Breast cancer Neg Hx   . Ovarian cancer Neg Hx   . Colon cancer Neg Hx     Social History Social History   Tobacco Use  . Smoking status: Former Smoker    Packs/day: 0.50    Types: Cigarettes    Quit date: 03/01/2015    Years since quitting: 4.5  . Smokeless tobacco: Never Used  Substance Use Topics  . Alcohol use: Yes    Alcohol/week: 0.0 standard drinks    Comment: occasionally  . Drug use: No     Review of Systems  Constitutional: No fever/chills Eyes: No visual changes. No discharge ENT: No upper respiratory complaints. Cardiovascular: no chest pain. Respiratory: no cough. No SOB. Gastrointestinal: No abdominal pain.  No nausea, no vomiting.  No diarrhea.   Musculoskeletal: Radicular pain from the neck into the left upper extremity.  Pain is chronic. Skin:  Negative for rash, abrasions, lacerations, ecchymosis. Neurological: Negative for headaches, focal weakness or numbness. 10-point ROS otherwise negative.  ____________________________________________   PHYSICAL EXAM:  VITAL SIGNS: ED Triage Vitals  Enc Vitals Group     BP 09/16/19 1544 (!) 148/97     Pulse Rate 09/16/19 1544 72     Resp 09/16/19 1544 18     Temp 09/16/19 1544 99 F (37.2 C)     Temp Source 09/16/19 1544 Oral     SpO2 09/16/19 1544 98 %     Weight 09/16/19 1545 202 lb (91.6 kg)     Height 09/16/19 1545 5\' 4"  (1.626 m)     Head Circumference --      Peak Flow --      Pain Score --      Pain Loc --      Pain Edu? --      Excl. in [place]? --      Constitutional:  Alert and oriented. Well appearing and in no acute distress. Eyes: Conjunctivae are normal. PERRL. EOMI. Head: Atraumatic. ENT:      Ears:       Nose: No congestion/rhinnorhea.      Mouth/Throat: Mucous membranes are moist.  Neck: No stridor.  Diffuse left-sided tenderness to palpation extending into the shoulder.  Radial pulse and sensation intact bilateral upper extremities.  See below documentation for further exam of the left upper extremity.  Cardiovascular: Normal rate, regular rhythm. Normal S1 and S2.  Good peripheral circulation. Respiratory: Normal respiratory effort without tachypnea or retractions. Lungs CTAB. Good air entry to the bases with no decreased or absent breath sounds. Musculoskeletal: Full range of motion to all extremities. No gross deformities appreciated.  Visualization of the left shoulder, left upper extremity reveals no gross deformity.  Minimal edema to the left wrist which appears to be chronic based off of previous documentation.  No overlying erythema.  Patient does have full range of motion to the left shoulder, however limits range of motion due to pain.  Full range of motion to the elbow, wrist.  Radial pulse intact.  Sensation intact all digits.  Patient has diffuse tenderness to palpation along the posterior shoulder, lateral aspect of the shoulder, acromioclavicular joint space.  Patient does have some mild tenderness throughout the left upper extremity without point specific tenderness and no overlying joint pain.  No gross erythema or edema of any joint.   Neurologic:  Normal speech and language. No gross focal neurologic deficits are appreciated.  Skin:  Skin is warm, dry and intact. No rash noted. Psychiatric: Mood and affect are normal. Speech and behavior are normal. Patient exhibits appropriate insight and judgement.   ____________________________________________   LABS (all labs ordered are listed, but only abnormal results are displayed)  Labs  Reviewed - No data to display ____________________________________________  EKG   ____________________________________________  RADIOLOGY   Previous MRI from April 2020 reviewed.  Findings consistent with osteoarthritis, degenerative disc disease appreciated.  No stenosis identified.  No results found.  ____________________________________________    PROCEDURES  Procedure(s) performed:    Procedures    Medications  dexamethasone (DECADRON) injection 10 mg (has no administration in time range)  orphenadrine (NORFLEX) injection 60 mg (has no administration in time range)  oxyCODONE-acetaminophen (PERCOCET/ROXICET) 5-325 MG per tablet 1 tablet (has no administration in time range)     ____________________________________________   INITIAL IMPRESSION / ASSESSMENT AND PLAN / ED COURSE  Pertinent labs & imaging results that were available during  my care of the patient were reviewed by me and considered in my medical decision making (see chart for details).  Review of the [place] CSRS was performed in accordance of the [place] prior to dispensing any controlled drugs.           Patient's diagnosis is consistent with chronic left-sided cervical radiculopathy, bursitis of the left shoulder.  Patient presented to the emergency department with longstanding left neck, left-sided radicular symptoms in the upper extremity.  Patient has had dedicated imaging with both plain films, MRI within the past year.  I reviewed patient's MRI from April 2020.  This shows osteoarthritis, degenerative disc disease but no evidence of stenosis either foraminal or central canal stenosis.  Patient has a worsening of her symptoms.  No recent trauma.  No other concerning findings on physical exam warranting repeat emergent MRI at this time.  According to documentation, patient is unwilling to pursue injections both epidural, intra-articular for symptom relief.  Patient is unable to take NSAIDs due to previous  gastric ulcer and GI bleeding.  Patient states that medications such as gabapentin "messed my stomach up."  Patient has been placed on chronic pain medication from orthopedics, however they declined to fill further prescriptions as of the end of November 2020.  On review of patient's medical records, it appears that patient has sought care at rheumatology, this department for further pain medication prescriptions both in December as well as the beginning of January.  Patient states that at this time the only medication that alleviates her symptoms is oxycodone.  Given patient's inability to take NSAIDs, I will prescribe a steroid course.  I will also prescribe a muscle relaxer but given orthopedics hesitation to continue narcotics, I will not prescribe any further narcotics at this time.  I advised patient to follow-up with neurosurgery for which she has an appointment in 3 days.  If she is still unwilling to pursue other avenues of symptom relief such as epidural injections, intra-articular injections, nerve pain medication such as gabapentin, she may follow-up with pain management for long-term narcotic prescriptions..  I have informed patient that I will prescribe medications to include muscle relaxer, prednisone but that she is to follow-up with specialist for further recommendations..  Patient is given ED precautions to return to the ED for any worsening or new symptoms.     ____________________________________________  FINAL CLINICAL IMPRESSION(S) / ED DIAGNOSES  Final diagnoses:  Cervical radiculopathy  Bursitis of left shoulder      NEW MEDICATIONS STARTED DURING THIS VISIT:  ED Discharge Orders         Ordered    predniSONE (DELTASONE) 10 MG tablet  Daily    Note to Pharmacy: Take 6 pills x 2 days, 5 pills x 2 days, 4 pills x 2 days, 3 pills x 2 days, 2 pills x 2 days, and 1 pill x 2 days   09/16/19 1800    methocarbamol (ROBAXIN) 500 MG tablet  4 times daily     09/16/19 1800               This chart was dictated using voice recognition software/Dragon. Despite best efforts to proofread, errors can occur which can change the meaning. Any change was purely unintentional.    Darletta Moll, PA-C 09/16/19 1800    Duffy Bruce, MD 09/18/19 1236

## 2019-09-19 ENCOUNTER — Other Ambulatory Visit: Payer: Self-pay | Admitting: Student

## 2019-09-19 DIAGNOSIS — M5412 Radiculopathy, cervical region: Secondary | ICD-10-CM

## 2019-09-29 ENCOUNTER — Ambulatory Visit
Admission: RE | Admit: 2019-09-29 | Discharge: 2019-09-29 | Disposition: A | Discharge status: Home / Self Care | Payer: Medicare Other | Source: Ambulatory Visit | Attending: Student | Admitting: Student

## 2019-09-29 ENCOUNTER — Other Ambulatory Visit: Payer: Self-pay

## 2019-09-29 DIAGNOSIS — M5412 Radiculopathy, cervical region: Secondary | ICD-10-CM

## 2019-10-12 ENCOUNTER — Telehealth: Payer: Self-pay

## 2019-10-12 ENCOUNTER — Other Ambulatory Visit: Payer: Self-pay | Admitting: Family Medicine

## 2019-10-12 DIAGNOSIS — Z1231 Encounter for screening mammogram for malignant neoplasm of breast: Secondary | ICD-10-CM

## 2019-10-12 NOTE — Progress Notes (Signed)
Patient: Caitlyn [name]  Service Category: E/M  Provider: Gaspar Cola, MD  DOB: [birth year]  DOS: 10/16/2019  Location: Office  MRN: [id number]  Setting: Ambulatory outpatient  Referring Provider: Theotis Burrow*  Type: New Patient  Specialty: Interventional Pain Management  PCP: Theotis Burrow, MD  Location: Remote location  Delivery: TeleHealth     Virtual Encounter - Pain Management PROVIDER NOTE: Information contained herein reflects review and annotations entered in association with encounter. Interpretation of such information and data should be left to medically-trained personnel. Information provided to patient can be located elsewhere in the medical record under "Patient Instructions". Document created using STT-dictation technology, any transcriptional errors that may result from process are unintentional.    Contact & Pharmacy Preferred: [phone] Home: [phone] (home) Mobile: [phone] (mobile) E-mail: [email or web address]  [place] ([address]), [employer] [place]) [place] [address] Phone: [phone] Fax: [phone]   Pre-screening note:  Our staff contacted Ms. [name] and offered her an "in person", "face-to-face" appointment versus a telephone encounter. She indicated preferring the telephone encounter, at this time.  Primary Reason(s) for Visit: Tele-Encounter for initial evaluation of one or more chronic problems (new to examiner) potentially causing chronic pain, and posing a threat to normal musculoskeletal function. (Level of risk: High) CC: Back Pain, Neck Pain, Arm Pain, and Knee Pain  I contacted Caitlyn [name] on 10/16/2019 via telephone.      I clearly identified myself as Gaspar Cola, MD. I verified that I was speaking with the correct person using two identifiers (Name: Margarie Mcguirt, and date of birth: [birth year]).  Advanced  Informed Consent I sought verbal advanced consent from Caitlyn [name] for virtual visit interactions. I informed Ms. [name] of possible security and privacy concerns, risks, and limitations associated with providing "not-in-person" medical evaluation and management services. I also informed Ms. [name] of the availability of "in-person" appointments. Finally, I informed her that there would be a charge for the virtual visit and that she could be  personally, fully or partially, financially responsible for it. Ms. Lech expressed understanding and agreed to proceed.   HPI  Ms. Fullman is a 54 y.o. year old, female patient, contacted today for an initial evaluation of her chronic pain. She has Anemia; Primary localized osteoarthritis of right knee; Abnormal hepatitis serology; Polyarthralgia; Leiomyoma uteri; S/P TAH (total abdominal hysterectomy); Surgical menopause, symptomatic; Status post abdominal hysterectomy and left salpingo-oophorectomy; Primary localized osteoarthritis of left knee; Synovitis of left knee; Bilateral anterior knee pain; Bilateral arm pain; Bursitis of left shoulder; Chest pain on breathing; Chronic pain of wrist (Left); Dizziness; Episodic lightheadedness; Heart palpitations; Hypertension; Left cervical radiculopathy; Myalgia; Myofascial muscle pain; Nonrheumatic aortic valve insufficiency; Non-rheumatic mitral regurgitation; Non-rheumatic tricuspid valve insufficiency; Vitamin D deficiency; Chronic pain syndrome; Cervicalgia; Chronic neck pain (1ry area of Pain) (Bilateral) (L>R); Chronic shoulder pain (Left); Chronic upper extremity pain (Bilateral); Chronic low back pain (Bilateral) w/o sciatica; Chronic lower extremity pain (Bilateral) (R>L); 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   Onset and Duration: Gradual and Present longer than 3 months Cause of pain: Unknown Severity: Getting worse, NAS-11 at its worse: 10/10, NAS-11 at its best: 3/10, NAS-11 now: 3/10 and NAS-11 on the average: 3/10 Timing: Night Aggravating Factors: chores, position Alleviating Factors: Medications  Associated Problems: Pain that wakes patient up and Pain that does not allow patient to sleep Quality of Pain: Aching and Toothache-like Previous Examinations or Tests: MRI scan, X-rays, Neurological evaluation and Orthopedic evaluation Previous Treatments: Narcotic medications  According to the patient her primary area of pain is that of the neck, bilaterally, with the left being worse than the right.  She denies any prior surgeries, but she does admit to having had some physical therapy at the [facility] around August 2019.  She denies any nerve blocks or joint injections in the cervical region.  The patient's secondary area pain is that of the upper extremities, specifically on the left side with the pain going all the way into her left hand where it affects her thumb, index finger, and middle finger.  She occasionally will have some pain into the ring finger.  In the upper extremity she has had surgery to release trigger fingers at the [facility] clinic with Dr. Youlanda Mighty.  The fingers that she had release where the ring finger and the middle finger.  She denies any other nerve blocks or joint injections into the upper extremity or hand.  The third area pain is that of the lower back, bilaterally, with the left being worse than the right.  She denies any back surgeries, physical therapy, or any nerve blocks in that region.  The pain in the lower back seems to radiate towards her left hip where she denies having had any surgeries or nerve blocks.  The patient's fourth area pain is that of the lower 70s,  bilaterally, with the right being worse than the left.  In the case of the right lower extremity the pain goes all the way down into her heel and she describes this pain has been constant.  Occasionally she will have pain into the inner portion of the foot and what seems to be an L4 dermatomal distribution.  In the case of the left lower extremity the pain is intermittent and it does not go into the foot.  The patient's last area of pain is that of the knees, bilaterally, with the left being worse than the right.  Interestingly, the patient has had bilateral total knee replacements, but she continues to have pain in the knees.  The patient denies any allergies to medications and she indicates that she has taken tramadol for the pain, which she normally takes in combination with some over-the-counter Tylenol.  She has also taken some oxycodone which she has noticed that it did help her considerably with the pain.  In terms of muscle relaxants she is currently on Flexeril (cyclobenzaprine) 5 mg which she takes 1 tablet p.o. at bedtime.  She denies ever having tried Lyrica (pregabalin), but she admits to having taken some Neurontin (gabapentin), but she indicates that she has problems with GERD and the gabapentin seems to upset her stomach.  She indicated having initially taken the 300 mg pills and later having been given the 100 mg pill.  Historic Controlled Substance Pharmacotherapy Review  Current opioid analgesics:  Tramadol 50 mg 1 tablet p.o. 3 times daily (last prescribed 08/10/2019), oxycodone/APAP 5/325 1 tablet p.o. every 8 hours (last prescribed 09/08/2019) (28.12 MME) Highest recorded MME/day: 75 mg/day MME/day: 28.12 mg/day   Historical Monitoring: The patient  reports no history of drug use. List of all UDS Test(s): No results found. List of other Serum/Urine Drug Screening Test(s):  No results found. Historical Background Evaluation: [place] PMP: PDMP reviewed during this encounter. Two (  2)  year initial data search conducted.             PMP NARX Score Report:  Narcotic: 310 Sedative: 180 Stimulant: 000 Risk Assessment Profile: PMP NARX Overdose Risk Score: 170  Pharmacologic Plan: As per protocol, I have not taken over any controlled substance management, pending the results of ordered tests and/or consults.            Initial impression: Pending review of available data and ordered tests.  Meds   Current Outpatient Medications:  .  cyclobenzaprine (FLEXERIL) 5 MG tablet, Take 5 mg by mouth 3 (three) times daily as needed., Disp: , Rfl:  .  metoprolol succinate (TOPROL-XL) 25 MG 24 hr tablet, Take 25 mg by mouth daily., Disp: , Rfl:  .  omeprazole (PRILOSEC) 40 MG capsule, , Disp: , Rfl:  .  oxyCODONE-acetaminophen (PERCOCET) 5-325 MG tablet, Take 1 tablet by mouth every 6 (six) hours as needed for severe pain., Disp: 15 tablet, Rfl: 0 .  pantoprazole (PROTONIX) 40 MG tablet, Take 40 mg by mouth 2 (two) times daily as needed. , Disp: , Rfl:  .  traMADol (ULTRAM) 50 MG tablet, Take 1 tablet (50 mg total) by mouth as needed. (Patient taking differently: Take 50 mg by mouth 2 (two) times daily as needed for moderate pain. ), Disp: 40 tablet, Rfl: 1 .  DICLOFENAC PO, Take by mouth., Disp: , Rfl:  .  gabapentin (NEURONTIN) 100 MG capsule, Take 100 mg by mouth 2 (two) times daily., Disp: , Rfl:  .  lisinopril-hydrochlorothiazide (ZESTORETIC) 20-25 MG tablet, Take 1 tablet by mouth daily., Disp: , Rfl:  .  methocarbamol (ROBAXIN) 500 MG tablet, Take 1 tablet (500 mg total) by mouth 4 (four) times daily. (Patient not taking: Reported on 10/13/2019), Disp: 16 tablet, Rfl: 0 .  predniSONE (DELTASONE) 10 MG tablet, Take 1 tablet (10 mg total) by mouth daily. (Patient not taking: Reported on 10/13/2019), Disp: 42 tablet, Rfl: 0 .  sertraline (ZOLOFT) 50 MG tablet, Take 50 mg by mouth daily. , Disp: , Rfl:  .  tiZANidine (ZANAFLEX) 2 MG tablet, Take 2 mg by mouth 3 times/day as  needed-between meals & bedtime. , Disp: , Rfl:  .  triamcinolone ointment (KENALOG) 0.5 %, Apply 1 application topically at bedtime. (Patient not taking: Reported on 10/13/2019), Disp: 30 g, Rfl: 0 .  Vitamin D, Ergocalciferol, (DRISDOL) 50000 units CAPS capsule, Take 50,000 Units by mouth every 7 (seven) days., Disp: , Rfl:  .  zolpidem (AMBIEN) 10 MG tablet, Take 10 mg by mouth at bedtime. , Disp: , Rfl:   Current Facility-Administered Medications:  .  triamcinolone cream (KENALOG) 0.1 %, , Topical, QHS, Evans, Nyoka Lint, MD  ROS  Cardiovascular: No reported cardiovascular signs or symptoms such as High blood pressure, coronary artery disease, abnormal heart rate or rhythm, heart attack, blood thinner therapy or heart weakness and/or failure Pulmonary or Respiratory: No reported pulmonary signs or symptoms such as wheezing and difficulty taking a deep full breath (Asthma), difficulty blowing air out (Emphysema), coughing up mucus (Bronchitis), persistent dry cough, or temporary stoppage of breathing during sleep Neurological: No reported neurological signs or symptoms such as seizures, abnormal skin sensations, urinary and/or fecal incontinence, being born with an abnormal open spine and/or a tethered spinal cord Psychological-Psychiatric: No reported psychological or psychiatric signs or symptoms such as difficulty sleeping, anxiety, depression, delusions or hallucinations (schizophrenial), mood swings (bipolar disorders) or suicidal ideations or attempts Gastrointestinal: Reflux or heatburn  Genitourinary: No reported renal or genitourinary signs or symptoms such as difficulty voiding or producing urine, peeing blood, non-functioning kidney, kidney stones, difficulty emptying the bladder, difficulty controlling the flow of urine, or chronic kidney disease Hematological: No reported hematological signs or symptoms such as prolonged bleeding, low or poor functioning platelets, bruising or bleeding  easily, hereditary bleeding problems, low energy levels due to low hemoglobin or being anemic Endocrine: No reported endocrine signs or symptoms such as high or low blood sugar, rapid heart rate due to high thyroid levels, obesity or weight gain due to slow thyroid or thyroid disease Rheumatologic: No reported rheumatological signs and symptoms such as fatigue, joint pain, tenderness, swelling, redness, heat, stiffness, decreased range of motion, with or without associated rash Musculoskeletal: Negative for myasthenia gravis, muscular dystrophy, multiple sclerosis or malignant hyperthermia  Allergies  Ms. [name] has No Known Allergies.  Laboratory Chemistry Profile   Renal Lab Results  Component Value Date   BUN 11 09/08/2019   CREATININE 0.69 09/08/2019   GFRAA >60 09/08/2019   GFRNONAA >60 09/08/2019   SPECGRAV 1.010 01/26/2017   PHUR 6.0 01/26/2017   PROTEINUR NEGATIVE 07/08/2018    Electrolytes Lab Results  Component Value Date   NA 138 09/08/2019   K 3.6 09/08/2019   CL 102 09/08/2019   CALCIUM 9.0 09/08/2019    Hepatic Lab Results  Component Value Date   AST 31 09/08/2019   ALT 48 (H) 09/08/2019   ALBUMIN 3.9 09/08/2019   ALKPHOS 68 09/08/2019    ID Lab Results  Component Value Date   HIV NON REACTIVE 01/13/2017   [place] NEGATIVE 05/26/2019   STAPHAUREUS NEGATIVE 10/20/2017   MRSAPCR NEGATIVE 10/20/2017   PREGTESTUR NEGATIVE 01/18/2017    Bone No results found.  Endocrine Lab Results  Component Value Date   GLUCOSE 210 (H) 09/08/2019   GLUCOSEU NEGATIVE 07/08/2018    Neuropathy Lab Results  Component Value Date   HIV NON REACTIVE 01/13/2017    CNS No results found.  Inflammation (CRP: Acute  ESR: Chronic) Lab Results  Component Value Date   ESRSEDRATE 9 03/04/2017    Rheumatology No results found.  Coagulation Lab Results  Component Value Date   INR 1.11 03/04/2017   LABPROT 14.4 03/04/2017   APTT 29 03/04/2017   PLT 216  09/08/2019    Cardiovascular Lab Results  Component Value Date   TROPONINI <0.03 07/08/2018   HGB 13.2 09/08/2019   HCT 39.2 09/08/2019    Screening Lab Results  Component Value Date   SARSCOV2NAA NEGATIVE 05/26/2019   STAPHAUREUS NEGATIVE 10/20/2017   MRSAPCR NEGATIVE 10/20/2017   HIV NON REACTIVE 01/13/2017   PREGTESTUR NEGATIVE 01/18/2017    Cancer No results found.  Note: Lab results reviewed.  Imaging Review  Cervical Imaging: Cervical MR wo contrast:  Results for orders placed during the hospital encounter of 09/29/19  MR CERVICAL SPINE WO CONTRAST   Narrative CLINICAL DATA:  Neck pain radiating into the left shoulder and arm for 2 months  EXAM: MRI CERVICAL SPINE WITHOUT CONTRAST  TECHNIQUE: Multiplanar, multisequence MR imaging of the cervical spine was performed. No intravenous contrast was administered.  COMPARISON:  12/20/2018  FINDINGS: Alignment: Physiologic.  Vertebrae: No fracture, evidence of discitis, or bone lesion.  Cord: Normal signal and morphology.  Posterior Fossa, vertebral arteries, paraspinal tissues: Posterior fossa demonstrates no focal abnormality. Vertebral artery flow voids are maintained. Paraspinal soft tissues are unremarkable.  Disc levels:  Discs: Degenerative disease with disc height loss at  C2-3, C3-4, C4-5, C5-6 and C6-7.  C2-3: No significant disc bulge. No neural foraminal stenosis. No central canal stenosis.  C3-4: Mild broad-based disc bulge. Left uncovertebral degenerative changes. Moderate left foraminal stenosis. No right foraminal stenosis. No central canal stenosis.  C4-5: Mild broad-based disc bulge. Moderate left foraminal stenosis. No right foraminal stenosis. No central canal stenosis.  C5-6: Mild broad-based disc osteophyte complex. Severe left foraminal stenosis. Moderate right foraminal stenosis. Mild spinal stenosis.  C6-7: Mild broad-based disc bulge. Moderate bilateral foraminal stenosis. No  central canal stenosis.  C7-T1: No significant disc bulge. No neural foraminal stenosis. No central canal stenosis.  IMPRESSION: 1. Cervical spine spondylosis as described above which appears worse compared with the prior exam.   Electronically Signed   By: Kathreen Devoid   On: 09/29/2019 13:33    Cervical DG 2-3 views:  Results for orders placed during the hospital encounter of 09/08/19  DG Cervical Spine 2-3 Views   Narrative CLINICAL DATA:  Cervicalgia  EXAM: CERVICAL SPINE - 2-3 VIEW  COMPARISON:  November 28, 2014  FINDINGS: Frontal, lateral, and open-mouth odontoid images were obtained. There is no fracture or spondylolisthesis. Prevertebral soft tissues and predental space regions are normal. There is moderate disc space narrowing at C3-4, C4-5, C5-6, and C6-7. There are prominent anterior osteophytes at C5, C6, and C7 with calcification in the anterior longitudinal ligament at C5-6 and C6-7. There is no erosive change.  There is reversal of lordotic curvature.  Lung apices are clear.  IMPRESSION: Multilevel osteoarthritic change, similar to prior study. Reversal of lordotic curvature is likely indicative of muscle spasm. No fracture or spondylolisthesis.   Electronically Signed   By: Lowella Grip III M.D.   On: 09/08/2019 14:10    Shoulder Imaging: Shoulder-L DG:  Results for orders placed during the hospital encounter of 09/08/19  DG Shoulder Left   Narrative CLINICAL DATA:  Shoulder pain  EXAM: LEFT SHOULDER - 2+ VIEW  COMPARISON:  None.  FINDINGS: Oblique, Y scapular, and axillary images were obtained. No fracture or dislocation. There is narrowing of the glenohumeral joint. No erosive change. Visualized left lung clear.  IMPRESSION: Glenohumeral joint narrowing. Acromioclavicular joint appears unremarkable. No evident fracture or dislocation.   Electronically Signed   By: Lowella Grip III M.D.   On: 09/08/2019 14:11    Thoracic  Imaging: Thoracic MR wo contrast:  Results for orders placed during the hospital encounter of 12/20/18  MR THORACIC SPINE WO CONTRAST   Narrative CLINICAL DATA:  NO KNOWN INJURY, CHRONIC NECK AND BACK PAIN  EXAM: MRI CERVICAL AND THORACIC SPINE WITHOUT CONTRAST  TECHNIQUE: Multiplanar and multiecho pulse sequences of the cervical spine and lumbar spine were obtained without intravenous contrast.  COMPARISON:  None.  FINDINGS: MRI CERVICAL SPINE FINDINGS  Alignment: Physiologic.  Vertebrae: No fracture, evidence of discitis, or bone lesion.  Cord: Normal signal and morphology.  Posterior Fossa, vertebral arteries, paraspinal tissues: Posterior fossa demonstrates no focal abnormality. Vertebral artery flow voids are maintained. Paraspinal soft tissues are unremarkable.  Disc levels:  Discs: Degenerative disease with disc height loss at C3-4, C4-5 and C5-6.  C2-3: No significant disc bulge. No neural foraminal stenosis. No central canal stenosis.  C3-4: Broad-based disc osteophyte complex. Mild left foraminal stenosis. No right foraminal stenosis. No central canal stenosis.  C4-5: Broad-based disc osteophyte complex. Mild bilateral foraminal narrowing. No central canal stenosis.  C5-6: Broad-based disc bulge. Mild spinal stenosis. Mild bilateral foraminal stenosis.  C6-7: Broad-based disc bulge. No  neural foraminal stenosis. No central canal stenosis.  C7-T1: No significant disc bulge. No neural foraminal stenosis. No central canal stenosis.  MRI THORACIC SPINE FINDINGS  Segmentation:  Standard.  Alignment:  Physiologic.  Vertebrae:  No fracture, evidence of discitis, or bone lesion.  Cord: Normal signal and morphology.  Appear normal.  Paraspinal and other soft tissues: No acute paraspinal abnormality.  Disc levels:  Disc spaces: Degenerative disc disease with disc height loss at T6-7, T7-8, T8-9 and T9-10.  T1-T2: No disc protrusion, foraminal  stenosis or central canal stenosis.  T2-T3: No disc protrusion, foraminal stenosis or central canal stenosis.  T3-T4: No disc protrusion, foraminal stenosis or central canal stenosis.  T4-T5: No disc protrusion, foraminal stenosis or central canal stenosis.  T5-T6: Mild broad-based disc bulge. No foraminal or central canal stenosis.  T6-T7: Mild broad-based disc bulge. No foraminal or central canal stenosis.  T7-T8: Mild broad-based disc bulge. No foraminal or central canal stenosis.  T8-T9: Mild broad-based disc bulge. No foraminal or central canal stenosis.  T9-T10: Mild broad-based disc bulge. No foraminal or central canal stenosis.  T10-T11: Mild broad-based disc bulge. No foraminal or central canal stenosis.  T11-T12: No disc protrusion, foraminal stenosis or central canal stenosis.  IMPRESSION: 1. Mild cervical spine spondylosis as described above. 2. Mild thoracic spine spondylosis as described above. 3. No acute osseous injury of the cervical spine and thoracic spine.   Electronically Signed   By: Kathreen Devoid   On: 12/20/2018 12:21    Lumbosacral Imaging: Lumbar DG 2-3 views:  Results for orders placed during the hospital encounter of 04/13/16  DG Lumbar Spine 2-3 Views   Narrative CLINICAL DATA:  Low back pain.  EXAM: LUMBAR SPINE - 2-3 VIEW  COMPARISON:  No prior.  FINDINGS: No acute bony abnormality identified. Multilevel degenerative change. Normal alignment mineralization . Surgical clips right upper quadrant.  IMPRESSION: No acute abnormality.  Multilevel degenerative change.   Electronically Signed   By: Marcello Moores  Register   On: 04/13/2016 09:21    Knee Imaging: Knee-L MR w contrast:  Results for orders placed during the hospital encounter of 10/29/16  MR KNEE LEFT WO CONTRAST   Narrative CLINICAL DATA:  Left knee pain.  Swelling.  EXAM: MRI OF THE LEFT KNEE WITHOUT CONTRAST  TECHNIQUE: Multiplanar, multisequence MR imaging  of the knee was performed. No intravenous contrast was administered.  COMPARISON:  None.  FINDINGS: MENISCI  Medial meniscus: Increased signal in the posterior horn of the medial meniscus particularly along the the root with possible undersurface tear.  Lateral meniscus: Possible oblique tear of the posterior horn of the lateral meniscus extending to the superior articular surface.  LIGAMENTS  Cruciates:  Intact ACL and PCL.  Collaterals: Medial collateral ligament is intact. Lateral collateral ligament complex is intact.  CARTILAGE  Patellofemoral: Partial-thickness cartilage loss of the patellofemoral compartment.  Medial: Extensive full-thickness cartilage loss of the medial femoral condyle and medial tibial plateau.  Lateral: Partial-thickness cartilage loss the lateral femorotibial compartment.  Joint: Large joint effusion. Mild edema in Hoffa's fat. No plical thickening.  Popliteal Fossa:  Small Baker cyst.  Intact popliteus tendon.  Extensor Mechanism: Intact quadriceps tendon and patellar tendon. Intact medial and lateral patellar retinaculum. Intact MPFL.  Bones:  No marrow signal abnormality.  No fracture or dislocation.  Other: No fluid collection or hematoma.  IMPRESSION: 1. Tricompartmental cartilage abnormalities most severe in the medial femorotibial compartment. 2. Increased signal in the posterior horn of the medial meniscus particularly  along the the root with possible undersurface tear. 3. Possible oblique tear of the posterior horn of the lateral meniscus extending to the superior articular surface.   Electronically Signed   By: Kathreen Devoid   On: 10/29/2016 12:42    Knee-R DG 1-2 views:  Results for orders placed during the hospital encounter of 07/07/16  DG Knee 1-2 Views Right   Narrative CLINICAL DATA:  Status post right knee replacement today.  EXAM: RIGHT KNEE - 1-2 VIEW  COMPARISON:  None.  FINDINGS: Right total knee  arthroplasty is in place. The device is located. No fracture. Surgical staples and drain are noted. There is some gas in the joint and a small effusion.  IMPRESSION: Status post right knee replacement.  No acute abnormality.   Electronically Signed   By: Inge Rise M.D.   On: 07/07/2016 12:41    Knee-L DG 1-2 views:  Results for orders placed during the hospital encounter of 03/16/17  DG Knee 1-2 Views Left   Narrative CLINICAL DATA:  Left total knee replacement  EXAM: LEFT KNEE - 1-2 VIEW  COMPARISON:  None.  FINDINGS: The left knee demonstrates a total knee arthroplasty without evidence of hardware failure complication. There is no significant joint effusion. There is no fracture or dislocation. The alignment is anatomic. Post-surgical changes noted in the surrounding soft tissues.  IMPRESSION: Interval total left knee arthroplasty.   Electronically Signed   By: Kathreen Devoid   On: 03/16/2017 13:41    Complexity Note: Imaging results reviewed. Results shared with Ms. [name], using Layman's terms.                        PFSH  Drug: Ms. Grimes  reports no history of drug use. Alcohol:  reports current alcohol use. Tobacco:  reports that she quit smoking about 4 years ago. Her smoking use included cigarettes. She smoked 0.50 packs per day. She has never used smokeless tobacco. Medical:  has a past medical history of Anemia, Anemia, Anxiety, Arthritis, COVID-19, Depression, Dyspepsia, GERD (gastroesophageal reflux disease), Heart burn, Hypertension, and Irregular menstrual cycle. Family: family history includes Diabetes in her father; Heart failure in her father; Hypertension in her father; Osteoarthritis in her mother and sister.  Past Surgical History:  Procedure Laterality Date  . ABDOMINAL HYSTERECTOMY Left 01/18/2017   Procedure: HYSTERECTOMY ABDOMINAL WITH LEFT SALPINGO OOPHERECTOMY;  Surgeon: Brayton Mars, MD;  Location: [facility] ORS;   Service: Gynecology;  Laterality: Left;  . CHOLECYSTECTOMY    . CHOLECYSTECTOMY, LAPAROSCOPIC    . COLONOSCOPY WITH PROPOFOL N/A 11/04/2017   Procedure: COLONOSCOPY WITH PROPOFOL;  Surgeon: Lollie Sails, MD;  Location: [facility] ENDOSCOPY;  Service: Endoscopy;  Laterality: N/A;  . ESOPHAGOGASTRODUODENOSCOPY N/A 11/04/2017   Procedure: ESOPHAGOGASTRODUODENOSCOPY (EGD);  Surgeon: Lollie Sails, MD;  Location: [facility] ENDOSCOPY;  Service: Endoscopy;  Laterality: N/A;  . ESOPHAGOGASTRODUODENOSCOPY    . ESOPHAGOGASTRODUODENOSCOPY (EGD) WITH PROPOFOL N/A 05/30/2019   Procedure: ESOPHAGOGASTRODUODENOSCOPY (EGD) WITH PROPOFOL;  Surgeon: Toledo, Benay Pike, MD;  Location: [facility] ENDOSCOPY;  Service: Gastroenterology;  Laterality: N/A;  . JOINT REPLACEMENT    . KNEE ARTHROSCOPY Left 12/03/2016   Procedure: ARTHROSCOPY KNEE, partial medial menisectomy;  Surgeon: Hessie Knows, MD;  Location: [facility] ORS;  Service: Orthopedics;  Laterality: Left;  . KNEE ARTHROTOMY Left 10/21/2017   Procedure: KNEE ARTHROTOMY LEFT LATERAL RELEASE MEDIAL RETINACULART REPAIR POLY EXCHANGE;  Surgeon: Hessie Knows, MD;  Location: [facility];  Service: Orthopedics;  Laterality: Left;  .  KNEE CLOSED REDUCTION Left 04/15/2017   Procedure: CLOSED MANIPULATION KNEE;  Surgeon: Hessie Knows, MD;  Location: [facility] ORS;  Service: Orthopedics;  Laterality: Left;  . LAPAROSCOPIC OVARIAN CYSTECTOMY Right   . MENISCECTOMY Right   . TOTAL KNEE ARTHROPLASTY Right 07/07/2016   Procedure: TOTAL KNEE ARTHROPLASTY;  Surgeon: Hessie Knows, MD;  Location: [facility];  Service: Orthopedics;  Laterality: Right;  . TOTAL KNEE ARTHROPLASTY Left 03/16/2017   Procedure: TOTAL KNEE ARTHROPLASTY;  Surgeon: Hessie Knows, MD;  Location: [facility] ORS;  Service: Orthopedics;  Laterality: Left;  . TUBAL LIGATION     Active Ambulatory Problems    Diagnosis Date Noted  . Anemia 10/23/2015  . Primary localized osteoarthritis of right knee 07/07/2016  . Abnormal hepatitis  serology 04/10/2016  . Polyarthralgia 04/03/2016  . 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 S/P TAH (total abdominal hysterectomy) 01/20/2017  . Surgical menopause, symptomatic 01/26/2017  . Status post abdominal hysterectomy and left salpingo-oophorectomy 01/26/2017  . Primary localized osteoarthritis of left knee 03/16/2017  . Synovitis of left knee 10/21/2017  . Bilateral anterior knee pain 04/03/2016  . Bilateral arm pain 01/11/2019  . Bursitis of left shoulder 08/10/2019  . Chest pain on breathing 05/27/2017  . Chronic pain of wrist (Left) 10/27/2017  . Dizziness 05/27/2017  . Episodic lightheadedness 05/27/2017  . Heart palpitations 02/07/2018  . Hypertension 02/07/2018  . Left cervical radiculopathy 08/10/2019  . Myalgia 05/12/2017  . Myofascial muscle pain 10/27/2017  . Nonrheumatic aortic valve insufficiency 02/28/2018  . Non-rheumatic mitral regurgitation 02/28/2018  . Non-rheumatic tricuspid valve insufficiency 02/28/2018  . Vitamin D deficiency 05/03/2018  . Chronic pain syndrome 10/16/2019  . Cervicalgia 10/16/2019  . Chronic neck pain (1ry area of Pain) (Bilateral) (L>R) 10/16/2019  . Chronic shoulder pain (Left) 10/16/2019  . Chronic upper extremity pain (Bilateral) 10/16/2019  . Chronic low back pain (Bilateral) w/o sciatica 10/16/2019  . Chronic lower extremity pain (Bilateral) (R>L) 10/16/2019  . 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 Pharmacologic therapy 10/16/2019  . Disorder of skeletal system 10/16/2019  . Problems influencing health status 10/16/2019  . DDD (degenerative disc disease), cervical 10/16/2019  . DDD (degenerative disc disease), lumbosacral 10/16/2019  . Abnormal MRI, cervical spine (09/29/2019) 10/16/2019   Resolved Ambulatory Problems    Diagnosis Date Noted  . Menorrhagia with regular cycle 10/23/2015  . Fibroid uterus 10/23/2015  . S/P TAH (total abdominal hysterectomy) 01/20/2017   Past Medical History:   Diagnosis Date  . Anxiety   . Arthritis   . COVID-19   . Depression   . Dyspepsia   . GERD (gastroesophageal reflux disease)   . Heart burn   . Irregular menstrual cycle    Assessment  Primary Diagnosis & Pertinent Problem List: The primary encounter diagnosis was Chronic pain syndrome. Diagnoses of Cervicalgia, Chronic neck pain (1ry area of Pain) (Bilateral) (L>R), Chronic shoulder pain (Left), Chronic upper extremity pain (Bilateral), Left cervical radiculopathy, Chronic low back pain (Bilateral) w/o sciatica, Chronic lower extremity pain (Bilateral) (R>L), Chronic knee pain after total replacement (Bilateral) (L>R), Pharmacologic therapy, Disorder of skeletal system, Problems influencing health status, Vitamin D deficiency, Other intervertebral disc degeneration, lumbar region, DDD (degenerative disc disease), cervical, DDD (degenerative disc disease), lumbosacral, and Abnormal MRI, cervical spine (09/29/2019) were also pertinent to this visit.  Visit Diagnosis (New problems to examiner): 1. Chronic pain syndrome   2. Cervicalgia   3. Chronic neck pain (1ry area of Pain) (Bilateral) (L>R)   4. Chronic shoulder pain (  Left)   5. Chronic upper extremity pain (Bilateral)   6. Left cervical radiculopathy   7. Chronic low back pain (Bilateral) w/o sciatica   8. Chronic lower extremity pain (Bilateral) (R>L)   9. Chronic knee pain after total replacement (Bilateral) (L>R)   10. Pharmacologic therapy   11. Disorder of skeletal system   12. Problems influencing health status   13. Vitamin D deficiency   14. Other intervertebral disc degeneration, lumbar region   15. DDD (degenerative disc disease), cervical   16. DDD (degenerative disc disease), lumbosacral   17. Abnormal MRI, cervical spine (09/29/2019)    Plan of Care (Initial workup plan)  Note: Ms. Grills was reminded that as per protocol, today's visit has been an evaluation only. We have not taken over the patient's  controlled substance management.  Problem-specific plan: No problem-specific Assessment & Plan notes found for this encounter.   Lab Orders     Compliance Drug Analysis, Ur     Magnesium     Vitamin B12     Sedimentation rate     25-Hydroxy vitamin D Lcms D2+D3     C-reactive protein  Imaging Orders     DG Lumbar Spine Complete W/Bend     MR LUMBAR SPINE WO CONTRAST Referral Orders  No referral(s) requested today   Procedure Orders    No procedure(s) ordered today   Pharmacotherapy (current): Medications ordered:  No orders of the defined types were placed in this encounter.    Pharmacological management options:  Opioid Analgesics: The patient was informed that there is no guarantee that she would be a candidate for opioid analgesics. The decision will be made following CDC guidelines. This decision will be based on the results of diagnostic studies, as well as Ms. [name]'s risk profile.   Membrane stabilizer: To be determined at a later time  Muscle relaxant: To be determined at a later time  NSAID: To be determined at a later time  Other analgesic(s): To be determined at a later time   Interventional management options: Ms. Rotolo was informed that there is no guarantee that she would be a candidate for interventional therapies. The decision will be based on the results of diagnostic studies, as well as Ms. [name]'s risk profile.  Procedure(s) under consideration:  Diagnostic left CESI #1  Diagnostic bilateral Cervical facet block  Possible bilateral cervical facet RFA  Diagnostic bilateral lumbar facet block  Possible bilateral lumbar facet RFA  Diagnostic left L4-5 LESI  Diagnostic bilateral L4 TFESI  Diagnostic bilateral genicular nerve block  Possible bilateral genicular nerve RFA    Provider-requested follow-up: Return for (VV), (s/p Tests).  Future Appointments  Date Time Provider [place]  11/08/2019  3:00 PM [facility]  11/14/2019  9:00 AM Harlin Heys, MD [facility] None   Total duration of encounter: 35 minutes.  Primary Care Physician: Theotis Burrow, MD Note by: Gaspar Cola, MD Date: 10/16/2019; Time: 1:21 PM

## 2019-10-12 NOTE — Telephone Encounter (Signed)
Left message with Elenor Legato # [phone] for patient to call office for new patient questionaire.

## 2019-10-13 ENCOUNTER — Encounter: Payer: Self-pay | Admitting: Pain Medicine

## 2019-10-16 ENCOUNTER — Ambulatory Visit: Payer: Medicare Other | Attending: Pain Medicine | Admitting: Pain Medicine

## 2019-10-16 ENCOUNTER — Other Ambulatory Visit: Payer: Self-pay

## 2019-10-16 VITALS — Ht 64.0 in | Wt 202.0 lb

## 2019-10-16 DIAGNOSIS — Z79899 Other long term (current) drug therapy: Secondary | ICD-10-CM

## 2019-10-16 DIAGNOSIS — R937 Abnormal findings on diagnostic imaging of other parts of musculoskeletal system: Secondary | ICD-10-CM | POA: Insufficient documentation

## 2019-10-16 DIAGNOSIS — G894 Chronic pain syndrome: Secondary | ICD-10-CM | POA: Insufficient documentation

## 2019-10-16 DIAGNOSIS — M25561 Pain in right knee: Secondary | ICD-10-CM

## 2019-10-16 DIAGNOSIS — E559 Vitamin D deficiency, unspecified: Secondary | ICD-10-CM

## 2019-10-16 DIAGNOSIS — M5030 Other cervical disc degeneration, unspecified cervical region: Secondary | ICD-10-CM

## 2019-10-16 DIAGNOSIS — M25562 Pain in left knee: Secondary | ICD-10-CM

## 2019-10-16 DIAGNOSIS — Z789 Other specified health status: Secondary | ICD-10-CM

## 2019-10-16 DIAGNOSIS — M899 Disorder of bone, unspecified: Secondary | ICD-10-CM | POA: Insufficient documentation

## 2019-10-16 DIAGNOSIS — M5412 Radiculopathy, cervical region: Secondary | ICD-10-CM

## 2019-10-16 DIAGNOSIS — M542 Cervicalgia: Secondary | ICD-10-CM | POA: Diagnosis not present

## 2019-10-16 DIAGNOSIS — M79601 Pain in right arm: Secondary | ICD-10-CM

## 2019-10-16 DIAGNOSIS — M79604 Pain in right leg: Secondary | ICD-10-CM

## 2019-10-16 DIAGNOSIS — Z96653 Presence of artificial knee joint, bilateral: Secondary | ICD-10-CM

## 2019-10-16 DIAGNOSIS — M25512 Pain in left shoulder: Secondary | ICD-10-CM | POA: Diagnosis not present

## 2019-10-16 DIAGNOSIS — M79602 Pain in left arm: Secondary | ICD-10-CM

## 2019-10-16 DIAGNOSIS — M545 Low back pain: Secondary | ICD-10-CM

## 2019-10-16 DIAGNOSIS — M5137 Other intervertebral disc degeneration, lumbosacral region: Secondary | ICD-10-CM

## 2019-10-16 DIAGNOSIS — G8929 Other chronic pain: Secondary | ICD-10-CM | POA: Insufficient documentation

## 2019-10-16 DIAGNOSIS — M79605 Pain in left leg: Secondary | ICD-10-CM

## 2019-10-16 DIAGNOSIS — M5136 Other intervertebral disc degeneration, lumbar region: Secondary | ICD-10-CM

## 2019-10-23 NOTE — Progress Notes (Signed)
Patient has completed labs and is waiting on MRI approval.

## 2019-10-25 ENCOUNTER — Telehealth: Payer: Self-pay | Admitting: *Deleted

## 2019-11-03 ENCOUNTER — Telehealth: Payer: Self-pay | Admitting: Obstetrics and Gynecology

## 2019-11-03 ENCOUNTER — Other Ambulatory Visit: Payer: Self-pay | Admitting: Obstetrics and Gynecology

## 2019-11-03 NOTE — Telephone Encounter (Signed)
Patient came in this afternoon requesting a refill of Tricimolone ointment. Pharmacy of preference is [facility].  Thanks.

## 2019-11-07 NOTE — Telephone Encounter (Signed)
Please advise on refill. Patient has appointment 11/14/2019

## 2019-11-08 ENCOUNTER — Ambulatory Visit
Admission: RE | Admit: 2019-11-08 | Discharge: 2019-11-08 | Disposition: A | Discharge status: Home / Self Care | Payer: Medicare Other | Source: Ambulatory Visit | Attending: Family Medicine | Admitting: Family Medicine

## 2019-11-08 DIAGNOSIS — Z1231 Encounter for screening mammogram for malignant neoplasm of breast: Secondary | ICD-10-CM | POA: Diagnosis not present

## 2019-11-14 ENCOUNTER — Other Ambulatory Visit: Payer: Self-pay | Admitting: Family Medicine

## 2019-11-14 ENCOUNTER — Ambulatory Visit (INDEPENDENT_AMBULATORY_CARE_PROVIDER_SITE_OTHER): Payer: Medicare Other | Admitting: Obstetrics and Gynecology

## 2019-11-14 ENCOUNTER — Other Ambulatory Visit: Payer: Self-pay

## 2019-11-14 ENCOUNTER — Encounter: Payer: Self-pay | Admitting: Obstetrics and Gynecology

## 2019-11-14 VITALS — BP 182/105 | HR 97 | Ht 64.0 in | Wt 205.2 lb

## 2019-11-14 DIAGNOSIS — N9089 Other specified noninflammatory disorders of vulva and perineum: Secondary | ICD-10-CM | POA: Diagnosis not present

## 2019-11-14 DIAGNOSIS — Z1231 Encounter for screening mammogram for malignant neoplasm of breast: Secondary | ICD-10-CM

## 2019-11-14 DIAGNOSIS — Z01419 Encounter for gynecological examination (general) (routine) without abnormal findings: Secondary | ICD-10-CM

## 2019-11-14 MED ORDER — CLOBETASOL PROPIONATE 0.05 % EX CREA: 1.0000 "application " | TOPICAL_CREAM | Freq: Two times a day (BID) | CUTANEOUS | 2 refills | Status: AC

## 2019-11-14 NOTE — Progress Notes (Signed)
HPI:      [name] is a 54 y.o. [phone] who LMP was Patient's last menstrual period was 01/07/2017 (exact date).  Subjective:   She presents today for her annual examination.  She states that she has no new issues.  She continues to leak urine and wears pads daily.  Her biggest problem is constant vulvar irritation from being always "moist down there".  She says the itching has gotten much worse.  The Kenalog cream does help but not completely.  She feels like she might scratch it at night unknowingly. She is up-to-date on her mammograms.    Hx: The following portions of the patient's history were reviewed and updated as appropriate:             She  has a past medical history of Anemia, Anemia, Anxiety, Arthritis, COVID-19, Depression, Dyspepsia, GERD (gastroesophageal reflux disease), Heart burn, Hypertension, and Irregular menstrual cycle. She does not have any pertinent problems on file. She  has a past surgical history that includes Cholecystectomy, laparoscopic; Laparoscopic ovarian cystectomy (Right); Tubal ligation; Meniscectomy (Right); Total knee arthroplasty (Right, 07/07/2016); Cholecystectomy; Joint replacement; Knee arthroscopy (Left, 12/03/2016); Abdominal hysterectomy (Left, 01/18/2017); Total knee arthroplasty (Left, 03/16/2017); Knee Closed Reduction (Left, 04/15/2017); Knee arthrotomy (Left, 10/21/2017); Esophagogastroduodenoscopy (N/A, 11/04/2017); Colonoscopy with propofol (N/A, 11/04/2017); Esophagogastroduodenoscopy; and Esophagogastroduodenoscopy (egd) with propofol (N/A, 05/30/2019). Her family history includes Diabetes in her father; Heart failure in her father; Hypertension in her father; Osteoarthritis in her mother and sister. She  reports that she quit smoking about 4 years ago. Her smoking use included cigarettes. She smoked 0.50 packs per day. She has never used smokeless tobacco. She reports current alcohol use. She reports that she does not use drugs. She has a  current medication list which includes the following prescription(s): cyclobenzaprine, gabapentin, lisinopril-hydrochlorothiazide, metoprolol succinate, omeprazole, pantoprazole, tramadol, triamcinolone ointment, vitamin d (ergocalciferol), clobetasol cream, diclofenac, methocarbamol, oxycodone-acetaminophen, prednisone, sertraline, tizanidine, and zolpidem, and the following Facility-Administered Medications: triamcinolone cream. She has No Known Allergies.       Review of Systems:  Review of Systems  Constitutional: Denied constitutional symptoms, night sweats, recent illness, fatigue, fever, insomnia and weight loss.  Eyes: Denied eye symptoms, eye pain, photophobia, vision change and visual disturbance.  Ears/Nose/Throat/Neck: Denied ear, nose, throat or neck symptoms, hearing loss, nasal discharge, sinus congestion and sore throat.  Cardiovascular: Denied cardiovascular symptoms, arrhythmia, chest pain/pressure, edema, exercise intolerance, orthopnea and palpitations.  Respiratory: Denied pulmonary symptoms, asthma, pleuritic pain, productive sputum, cough, dyspnea and wheezing.  Gastrointestinal: Denied, gastro-esophageal reflux, melena, nausea and vomiting.  Genitourinary: See HPI for additional information.  Musculoskeletal: Denied musculoskeletal symptoms, stiffness, swelling, muscle weakness and myalgia.  Dermatologic: Denied dermatology symptoms, rash and scar.  Neurologic: Denied neurology symptoms, dizziness, headache, neck pain and syncope.  Psychiatric: Denied psychiatric symptoms, anxiety and depression.  Endocrine: Denied endocrine symptoms including hot flashes and night sweats.   Meds:   Current Outpatient Medications on File Prior to Visit  Medication Sig Dispense Refill  . cyclobenzaprine (FLEXERIL) 5 MG tablet Take 5 mg by mouth 3 (three) times daily as needed.    . gabapentin (NEURONTIN) 100 MG capsule Take 100 mg by mouth 2 (two) times daily.    Marland Kitchen  lisinopril-hydrochlorothiazide (ZESTORETIC) 20-25 MG tablet Take 1 tablet by mouth daily.    . metoprolol succinate (TOPROL-XL) 25 MG 24 hr tablet Take 25 mg by mouth daily.    Marland Kitchen omeprazole (PRILOSEC) 40 MG capsule     . pantoprazole (PROTONIX)  40 MG tablet Take 40 mg by mouth 2 (two) times daily as needed.     . traMADol (ULTRAM) 50 MG tablet Take 1 tablet (50 mg total) by mouth as needed. (Patient taking differently: Take 50 mg by mouth 2 (two) times daily as needed for moderate pain. ) 40 tablet 1  . triamcinolone ointment (KENALOG) 0.5 % Apply 1 application topically at bedtime. 30 g 0  . Vitamin D, Ergocalciferol, (DRISDOL) 50000 units CAPS capsule Take 50,000 Units by mouth every 7 (seven) days.    Marland Kitchen DICLOFENAC PO Take by mouth.    . methocarbamol (ROBAXIN) 500 MG tablet Take 1 tablet (500 mg total) by mouth 4 (four) times daily. (Patient not taking: Reported on 10/13/2019) 16 tablet 0  . oxyCODONE-acetaminophen (PERCOCET) 5-325 MG tablet Take 1 tablet by mouth every 6 (six) hours as needed for severe pain. (Patient not taking: Reported on 11/14/2019) 15 tablet 0  . predniSONE (DELTASONE) 10 MG tablet Take 1 tablet (10 mg total) by mouth daily. (Patient not taking: Reported on 10/13/2019) 42 tablet 0  . sertraline (ZOLOFT) 50 MG tablet Take 50 mg by mouth daily.     Marland Kitchen tiZANidine (ZANAFLEX) 2 MG tablet Take 2 mg by mouth 3 times/day as needed-between meals & bedtime.     Marland Kitchen zolpidem (AMBIEN) 10 MG tablet Take 10 mg by mouth at bedtime.      Current Facility-Administered Medications on File Prior to Visit  Medication Dose Route Frequency Provider Last Rate Last Admin  . triamcinolone cream (KENALOG) 0.1 %   Topical QHS Harlin Heys, MD        Objective:     Vitals:   11/14/19 0910  BP: (!) 182/105  Pulse: 97              Physical examination General NAD, Conversant  HEENT Atraumatic; Op clear with mmm.  Normo-cephalic. Pupils reactive. Anicteric sclerae  Thyroid/Neck Smooth  without nodularity or enlargement. Normal ROM.  Neck Supple.  Skin No rashes, lesions or ulceration. Normal palpated skin turgor. No nodularity.  Breasts: No masses or discharge.  Symmetric.  No axillary adenopathy.  Lungs: Clear to auscultation.No rales or wheezes. Normal Respiratory effort, no retractions.  Heart: NSR.  No murmurs or rubs appreciated. No periferal edema  Abdomen: Soft.  Non-tender.  No masses.  No HSM. No hernia  Extremities: Moves all appropriately.  Normal ROM for age. No lymphadenopathy.  Neuro: Oriented to PPT.  Normal mood. Normal affect.     Pelvic:   Vulva:  Some white appearing epithelium and some excoriated areas left greater than right.  Vagina: No lesions or abnormalities noted.  Support: Normal pelvic support.  Urethra No masses tenderness or scarring.  Meatus Normal size without lesions or prolapse.  Cervix: Surgically absent   Anus: Normal exam.  No lesions.  Perineum: Normal exam.  No lesions.        Bimanual   Uterus: Surgically absent   Adnexae: No masses.  Non-tender to palpation.  Cul-de-sac: Negative for abnormality.     Assessment:    [id number] Patient Active Problem List   Diagnosis Date Noted  . Chronic pain syndrome 10/16/2019  . Cervicalgia 10/16/2019  . Chronic neck pain (1ry area of Pain) (Bilateral) (L>R) 10/16/2019  . Chronic shoulder pain (Left) 10/16/2019  . Chronic upper extremity pain (Bilateral) 10/16/2019  . Chronic low back pain (Bilateral) w/o sciatica 10/16/2019  . Chronic lower extremity pain (Bilateral) (R>L) 10/16/2019  . Chronic knee pain  after total replacement (Bilateral) (L>R) 10/16/2019  . Pharmacologic therapy 10/16/2019  . Disorder of skeletal system 10/16/2019  . Problems influencing health status 10/16/2019  . DDD (degenerative disc disease), cervical 10/16/2019  . DDD (degenerative disc disease), lumbosacral 10/16/2019  . Abnormal MRI, cervical spine (09/29/2019) 10/16/2019  . Bursitis of left shoulder  08/10/2019  . Left cervical radiculopathy 08/10/2019  . Bilateral arm pain 01/11/2019  . Vitamin D deficiency 05/03/2018  . Nonrheumatic aortic valve insufficiency 02/28/2018  . Non-rheumatic mitral regurgitation 02/28/2018  . Non-rheumatic tricuspid valve insufficiency 02/28/2018  . Heart palpitations 02/07/2018  . Hypertension 02/07/2018  . Chronic pain of wrist (Left) 10/27/2017  . Myofascial muscle pain 10/27/2017  . Synovitis of left knee 10/21/2017  . Chest pain on breathing 05/27/2017  . Dizziness 05/27/2017  . Episodic lightheadedness 05/27/2017  . Myalgia 05/12/2017  . Primary localized osteoarthritis of left knee 03/16/2017  . Surgical menopause, symptomatic 01/26/2017  . Status post abdominal hysterectomy and left salpingo-oophorectomy 01/26/2017  . S/P TAH (total abdominal hysterectomy) 01/20/2017  . 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 Primary localized osteoarthritis of right knee 07/07/2016  . Abnormal hepatitis serology 04/10/2016  . Polyarthralgia 04/03/2016  . Bilateral anterior knee pain 04/03/2016  . Anemia 10/23/2015     1. Well woman exam with routine gynecological exam   2. Vulvar irritation     Possibly lichen sclerosus, possibly chronic irritation from pads.   Plan:            1.  Basic Screening Recommendations The basic screening recommendations for asymptomatic women were discussed with the patient during her visit.  The age-appropriate recommendations were discussed with her and the rational for the tests reviewed.  When I am informed by the patient that another primary care physician has previously obtained the age-appropriate tests and they are up-to-date, only outstanding tests are ordered and referrals given as necessary.  Abnormal results of tests will be discussed with her when all of her results are completed.  Routine preventative health maintenance measures emphasized: Exercise/Diet/Weight control, Tobacco Warnings, Alcohol/Substance use risks and  Stress Management  2.  Patient was previously instructed in vulvar care but this is no better than last year.  Will attempt to stop the constant itching using steroid cream.  Clobetasol. 3.  Patient again instructed on vulvar care and use of clobetasol.  Advised to discontinue scratching as much as possible. Orders No orders of the defined types were placed in this encounter.    Meds ordered this encounter  Medications  . clobetasol cream (TEMOVATE) 0.05 %    Sig: Apply 1 application topically 2 (two) times daily. Use for 7 days twice per day then once per day for 3 more weeks as directed    Dispense:  60 g    Refill:  2        F/U  Return in about 4 weeks (around 12/12/2019).  Finis Bud, M.D. 11/14/2019 9:55 AM

## 2019-11-15 ENCOUNTER — Inpatient Hospital Stay
Admission: RE | Admit: 2019-11-15 | Discharge: 2019-11-15 | Disposition: A | Discharge status: Home / Self Care | Payer: Self-pay | Source: Ambulatory Visit | Attending: *Deleted | Admitting: *Deleted

## 2019-11-15 ENCOUNTER — Other Ambulatory Visit: Payer: Self-pay | Admitting: *Deleted

## 2019-11-15 DIAGNOSIS — Z1231 Encounter for screening mammogram for malignant neoplasm of breast: Secondary | ICD-10-CM

## 2019-12-12 ENCOUNTER — Encounter: Payer: Medicare Other | Admitting: Obstetrics and Gynecology

## 2020-10-18 ENCOUNTER — Telehealth: Payer: Self-pay

## 2020-10-18 NOTE — Telephone Encounter (Signed)
Tried to call and that voicemail was full. Patient has had a hysterectomy so no Pap was done at annual exam.

## 2020-10-18 NOTE — Telephone Encounter (Signed)
[facility] called from [phone] est [phone] requesting a call back.  They are requesting to information about this pts last annual visit. They are needing to know if she had a pap at this visit.  Please advise

## 2020-10-27 ENCOUNTER — Other Ambulatory Visit: Payer: Self-pay

## 2020-10-27 ENCOUNTER — Emergency Department
Admission: EM | Admit: 2020-10-27 | Discharge: 2020-10-27 | Disposition: A | Discharge status: Home / Self Care | Payer: Medicare Other | Attending: Emergency Medicine | Admitting: Emergency Medicine

## 2020-10-27 ENCOUNTER — Emergency Department: Payer: Medicare Other

## 2020-10-27 ENCOUNTER — Encounter: Payer: Self-pay | Admitting: Radiology

## 2020-10-27 DIAGNOSIS — Z96653 Presence of artificial knee joint, bilateral: Secondary | ICD-10-CM | POA: Insufficient documentation

## 2020-10-27 DIAGNOSIS — J209 Acute bronchitis, unspecified: Secondary | ICD-10-CM | POA: Insufficient documentation

## 2020-10-27 DIAGNOSIS — I10 Essential (primary) hypertension: Secondary | ICD-10-CM | POA: Diagnosis not present

## 2020-10-27 DIAGNOSIS — Z79899 Other long term (current) drug therapy: Secondary | ICD-10-CM | POA: Diagnosis not present

## 2020-10-27 DIAGNOSIS — Z8616 Personal history of COVID-19: Secondary | ICD-10-CM | POA: Insufficient documentation

## 2020-10-27 DIAGNOSIS — R0602 Shortness of breath: Secondary | ICD-10-CM | POA: Diagnosis present

## 2020-10-27 DIAGNOSIS — Z20822 Contact with and (suspected) exposure to covid-19: Secondary | ICD-10-CM | POA: Diagnosis not present

## 2020-10-27 DIAGNOSIS — Z87891 Personal history of nicotine dependence: Secondary | ICD-10-CM | POA: Insufficient documentation

## 2020-10-27 LAB — CBC WITH DIFFERENTIAL/PLATELET
Abs Immature Granulocytes: 0.04 10*3/uL (ref 0.00–0.07)
Basophils Absolute: 0.1 10*3/uL (ref 0.0–0.1)
Basophils Relative: 1 %
Eosinophils Absolute: 0.2 10*3/uL (ref 0.0–0.5)
Eosinophils Relative: 3 %
HCT: 36.8 % (ref 36.0–46.0)
Hemoglobin: 12 g/dL (ref 12.0–15.0)
Immature Granulocytes: 1 %
Lymphocytes Relative: 27 %
Lymphs Abs: 2 10*3/uL (ref 0.7–4.0)
MCH: 27.5 pg (ref 26.0–34.0)
MCHC: 32.6 g/dL (ref 30.0–36.0)
MCV: 84.4 fL (ref 80.0–100.0)
Monocytes Absolute: 0.6 10*3/uL (ref 0.1–1.0)
Monocytes Relative: 8 %
Neutro Abs: 4.4 10*3/uL (ref 1.7–7.7)
Neutrophils Relative %: 60 %
Platelets: 220 10*3/uL (ref 150–400)
RBC: 4.36 MIL/uL (ref 3.87–5.11)
RDW: 12.3 % (ref 11.5–15.5)
WBC: 7.3 10*3/uL (ref 4.0–10.5)
nRBC: 0 % (ref 0.0–0.2)

## 2020-10-27 LAB — BASIC METABOLIC PANEL
Anion gap: 9 (ref 5–15)
BUN: 18 mg/dL (ref 6–20)
CO2: 25 mmol/L (ref 22–32)
Calcium: 8.6 mg/dL — ABNORMAL LOW (ref 8.9–10.3)
Chloride: 100 mmol/L (ref 98–111)
Creatinine, Ser: 0.69 mg/dL (ref 0.44–1.00)
GFR, Estimated: >60 mL/min (ref >60)
Glucose, Bld: 303 mg/dL — ABNORMAL HIGH (ref 70–99)
Potassium: 3.3 mmol/L — ABNORMAL LOW (ref 3.5–5.1)
Sodium: 134 mmol/L — ABNORMAL LOW (ref 135–145)

## 2020-10-27 LAB — BRAIN NATRIURETIC PEPTIDE: B Natriuretic Peptide: 14.3 pg/mL (ref 0.0–100.0)

## 2020-10-27 LAB — SARS CORONAVIRUS 2 (TAT 6-24 HRS): SARS Coronavirus 2: NEGATIVE

## 2020-10-27 MED ORDER — PREDNISONE 20 MG PO TABS: 40.0000 mg | ORAL_TABLET | Freq: Every day | ORAL | 0 refills | Status: DC

## 2020-10-27 MED ORDER — ALBUTEROL SULFATE (2.5 MG/3ML) 0.083% IN NEBU
5.0000 mg | INHALATION_SOLUTION | Freq: Once | RESPIRATORY_TRACT | Status: AC
  Administered 2020-10-27: 5 mg via RESPIRATORY_TRACT
  Filled 2020-10-27: qty 6

## 2020-10-27 MED ORDER — METHYLPREDNISOLONE SODIUM SUCC 125 MG IJ SOLR
125.0000 mg | Freq: Once | INTRAMUSCULAR | Status: AC
  Administered 2020-10-27: 125 mg via INTRAVENOUS
  Filled 2020-10-27: qty 2

## 2020-10-27 MED ORDER — ALBUTEROL SULFATE HFA 108 (90 BASE) MCG/ACT IN AERS
2.0000 | INHALATION_SPRAY | RESPIRATORY_TRACT | Status: DC | PRN
  Administered 2020-10-27: 2 via RESPIRATORY_TRACT
  Filled 2020-10-27: qty 6.7

## 2020-10-27 MED ORDER — IPRATROPIUM-ALBUTEROL 0.5-2.5 (3) MG/3ML IN SOLN
3.0000 mL | RESPIRATORY_TRACT | Status: AC
  Administered 2020-10-27 (×3): 3 mL via RESPIRATORY_TRACT
  Filled 2020-10-27 (×3): qty 3

## 2020-10-27 MED ORDER — AZITHROMYCIN 250 MG PO TABS: ORAL_TABLET | ORAL | 0 refills | Status: DC

## 2020-10-27 MED ORDER — ALBUTEROL SULFATE HFA 108 (90 BASE) MCG/ACT IN AERS: 2.0000 | INHALATION_SPRAY | RESPIRATORY_TRACT | 0 refills | Status: DC | PRN

## 2020-10-27 NOTE — ED Triage Notes (Signed)
Pt c/o difficulty breathing and shortness of breath since 2/25. Pt states she was wheezing.Pt used a nebulizer that belonged to her granddaughter and it made her feel a little better. No previous breathing problems. Resp are even and labored

## 2020-10-27 NOTE — ED Notes (Signed)
Pts O2 saturation remained above 96% on ambulation.

## 2020-10-27 NOTE — Discharge Instructions (Signed)
You may use your albuterol inhaler 2 to 4 puffs every 2-4 hours as needed for shortness of breath and wheezing.  Please note that while you are taking steroids this can raise your blood sugar and you will need to monitor your blood sugar very closely at home.  If your blood sugars are over 500, please return to the emergency department.  Please continue your Metformin and glipizide as prescribed.  Please take your antibiotics until complete.  We are covering you for possible bacterial infection but this may also be viral in nature.  Your COVID-19 test is pending.  If it is positive, you will need to quarantine for 10 days after the onset of symptoms.  You may follow-up on these results through [facility].

## 2020-10-27 NOTE — ED Provider Notes (Signed)
[facility] Emergency Department Provider Note  ____________________________________________   Event Date/Time   First MD Initiated Contact with Patient 10/27/20 0104     (approximate)  I have reviewed the triage vital signs and the nursing notes.   HISTORY  Chief Complaint Respiratory Distress    HPI Cortez Steelman is a 55 y.o. female with history of hypertension, diabetes who presents to the emergency department shortness of breath and wheezing for the past 2 days.  No fevers, cough, lower extremity swelling or pain.  No history of asthma, COPD, tobacco use, CHF.  No recent exposures to household chemicals, smoke.  No sick contacts.  She has been vaccinated for COVID-19 and influenza.  She reports she was using one of her family members nebulizer treatments at home and inhalers at home with some relief.  Dates she has never had wheezing before.  Spanish interpreter used.        Past Medical History:  Diagnosis Date  . Anemia   . Anemia   . Anxiety   . Arthritis   . COVID-19   . Depression   . Dyspepsia   . GERD (gastroesophageal reflux disease)   . Heart burn   . Hypertension   . Irregular menstrual cycle     Patient Active Problem List   Diagnosis Date Noted  . Chronic pain syndrome 10/16/2019  . Cervicalgia 10/16/2019  . Chronic neck pain (1ry area of Pain) (Bilateral) (L>R) 10/16/2019  . Chronic shoulder pain (Left) 10/16/2019  . Chronic upper extremity pain (Bilateral) 10/16/2019  . Chronic low back pain (Bilateral) w/o sciatica 10/16/2019  . Chronic lower extremity pain (Bilateral) (R>L) 10/16/2019  . 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 Pharmacologic therapy 10/16/2019  . Disorder of skeletal system 10/16/2019  . Problems influencing health status 10/16/2019  . DDD (degenerative disc disease), cervical 10/16/2019  . DDD (degenerative disc disease), lumbosacral 10/16/2019  . Abnormal  MRI, cervical spine (09/29/2019) 10/16/2019  . Bursitis of left shoulder 08/10/2019  . Left cervical radiculopathy 08/10/2019  . Bilateral arm pain 01/11/2019  . Vitamin D deficiency 05/03/2018  . Nonrheumatic aortic valve insufficiency 02/28/2018  . Non-rheumatic mitral regurgitation 02/28/2018  . Non-rheumatic tricuspid valve insufficiency 02/28/2018  . Heart palpitations 02/07/2018  . Hypertension 02/07/2018  . Chronic pain of wrist (Left) 10/27/2017  . Myofascial muscle pain 10/27/2017  . Synovitis of left knee 10/21/2017  . Chest pain on breathing 05/27/2017  . Dizziness 05/27/2017  . Episodic lightheadedness 05/27/2017  . Myalgia 05/12/2017  . Primary localized osteoarthritis of left knee 03/16/2017  . Surgical menopause, symptomatic 01/26/2017  . Status post abdominal hysterectomy and left salpingo-oophorectomy 01/26/2017  . S/P TAH (total abdominal hysterectomy) 01/20/2017  . 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 Primary localized osteoarthritis of right knee 07/07/2016  . Abnormal hepatitis serology 04/10/2016  . Polyarthralgia 04/03/2016  . Bilateral anterior knee pain 04/03/2016  . Anemia 10/23/2015    Past Surgical History:  Procedure Laterality Date  . ABDOMINAL HYSTERECTOMY Left 01/18/2017   Procedure: HYSTERECTOMY ABDOMINAL WITH LEFT SALPINGO OOPHERECTOMY;  Surgeon: Brayton Mars, MD;  Location: [facility] ORS;  Service: Gynecology;  Laterality: Left;  . CHOLECYSTECTOMY    . CHOLECYSTECTOMY, LAPAROSCOPIC    . COLONOSCOPY WITH PROPOFOL N/A 11/04/2017   Procedure: COLONOSCOPY WITH PROPOFOL;  Surgeon: Lollie Sails, MD;  Location: [facility] ENDOSCOPY;  Service: Endoscopy;  Laterality: N/A;  . ESOPHAGOGASTRODUODENOSCOPY N/A 11/04/2017   Procedure: ESOPHAGOGASTRODUODENOSCOPY (EGD);  Surgeon: Lollie Sails,  MD;  Location: [facility] ENDOSCOPY;  Service: Endoscopy;  Laterality: N/A;  . ESOPHAGOGASTRODUODENOSCOPY    . ESOPHAGOGASTRODUODENOSCOPY (EGD) WITH PROPOFOL N/A 05/30/2019    Procedure: ESOPHAGOGASTRODUODENOSCOPY (EGD) WITH PROPOFOL;  Surgeon: Toledo, Benay Pike, MD;  Location: [facility] ENDOSCOPY;  Service: Gastroenterology;  Laterality: N/A;  . JOINT REPLACEMENT    . KNEE ARTHROSCOPY Left 12/03/2016   Procedure: ARTHROSCOPY KNEE, partial medial menisectomy;  Surgeon: Hessie Knows, MD;  Location: [facility] ORS;  Service: Orthopedics;  Laterality: Left;  . KNEE ARTHROTOMY Left 10/21/2017   Procedure: KNEE ARTHROTOMY LEFT LATERAL RELEASE MEDIAL RETINACULART REPAIR POLY EXCHANGE;  Surgeon: Hessie Knows, MD;  Location: [facility] ORS;  Service: Orthopedics;  Laterality: Left;  . KNEE CLOSED REDUCTION Left 04/15/2017   Procedure: CLOSED MANIPULATION KNEE;  Surgeon: Hessie Knows, MD;  Location: [facility] ORS;  Service: Orthopedics;  Laterality: Left;  . LAPAROSCOPIC OVARIAN CYSTECTOMY Right   . MENISCECTOMY Right   . TOTAL KNEE ARTHROPLASTY Right 07/07/2016   Procedure: TOTAL KNEE ARTHROPLASTY;  Surgeon: Hessie Knows, MD;  Location: [facility] ORS;  Service: Orthopedics;  Laterality: Right;  . TOTAL KNEE ARTHROPLASTY Left 03/16/2017   Procedure: TOTAL KNEE ARTHROPLASTY;  Surgeon: Hessie Knows, MD;  Location: [facility] ORS;  Service: Orthopedics;  Laterality: Left;  . TUBAL LIGATION      Prior to Admission medications   Medication Sig Start Date End Date Taking? Authorizing Provider  albuterol (VENTOLIN HFA) 108 (90 Base) MCG/ACT inhaler Inhale 2 puffs into the lungs every 4 (four) hours as needed for wheezing or shortness of breath. 10/27/20  Yes Damir Leung, Delice Bison, DO  azithromycin (ZITHROMAX Z-PAK) 250 MG tablet Take 500 mg on your first day and then every day after take 250 mg. 10/27/20  Yes Brandon Scarbrough, Delice Bison, DO  predniSONE (DELTASONE) 20 MG tablet Take 2 tablets (40 mg total) by mouth daily. 10/27/20  Yes Angelamarie Avakian, Delice Bison, DO  cyclobenzaprine (FLEXERIL) 5 MG tablet Take 5 mg by mouth 3 (three) times daily as needed. 09/19/19   [provider]  DICLOFENAC PO Take by mouth.    [provider]   gabapentin (NEURONTIN) 100 MG capsule Take 100 mg by mouth 2 (two) times daily.    [provider]  lisinopril-hydrochlorothiazide (ZESTORETIC) 20-25 MG tablet Take 1 tablet by mouth daily. 09/28/19   [provider]  metoprolol succinate (TOPROL-XL) 25 MG 24 hr tablet Take 25 mg by mouth daily. 10/09/18   [provider]  omeprazole (PRILOSEC) 40 MG capsule  05/12/19   [provider]  pantoprazole (PROTONIX) 40 MG tablet Take 40 mg by mouth 2 (two) times daily as needed.     [provider]  sertraline (ZOLOFT) 50 MG tablet Take 50 mg by mouth daily.     [provider]  tiZANidine (ZANAFLEX) 2 MG tablet Take 2 mg by mouth 3 times/day as needed-between meals & bedtime.     [provider]  traMADol (ULTRAM) 50 MG tablet Take 1 tablet (50 mg total) by mouth as needed. Patient taking differently: Take 50 mg by mouth 2 (two) times daily as needed for moderate pain.  03/18/17   Reche Dixon, PA-C  triamcinolone ointment (KENALOG) 0.5 % APPLY  OINTMENT TOPICALLY TO AFFECTED AREA AT BEDTIME 11/29/19   Harlin Heys, MD  Vitamin D, Ergocalciferol, (DRISDOL) 50000 units CAPS capsule Take 50,000 Units by mouth every 7 (seven) days.    [provider]  zolpidem (AMBIEN) 10 MG tablet Take 10 mg by mouth at bedtime.  [provider]    Allergies Patient has no known allergies.  Family History  Problem Relation Age of Onset  . Osteoarthritis Mother   . Heart failure Father   . Diabetes Father   . Hypertension Father   . Osteoarthritis Sister   . Breast cancer Neg Hx   . Ovarian cancer Neg Hx   . Colon cancer Neg Hx     Social History Social History   Tobacco Use  . Smoking status: Former Smoker    Packs/day: 0.50    Types: Cigarettes    Quit date: 03/01/2015    Years since quitting: 5.6  . Smokeless tobacco: Never Used  Vaping Use  . Vaping Use: Never used  Substance Use Topics  . Alcohol use: Yes     Alcohol/week: 0.0 standard drinks    Comment: occasionally  . Drug use: No    Review of Systems Constitutional: No fever. Eyes: No visual changes. ENT: No sore throat. Cardiovascular: Denies chest pain. Respiratory: + shortness of breath. Gastrointestinal: No nausea, vomiting, diarrhea. Genitourinary: Negative for dysuria. Musculoskeletal: Negative for back pain. Skin: Negative for rash. Neurological: Negative for focal weakness or numbness.  ____________________________________________   PHYSICAL EXAM:  VITAL SIGNS: ED Triage Vitals  Enc Vitals Group     BP 10/27/20 0039 (!) 155/90     Pulse Rate 10/27/20 0036 88     Resp 10/27/20 0036 20     Temp 10/27/20 0036 98.7 F (37.1 C)     Temp Source 10/27/20 0036 Oral     SpO2 10/27/20 0039 96 %     Weight 10/27/20 0047 205 lb 4 oz (93.1 kg)     Height 10/27/20 0047 5\' 4"  (1.626 m)     Head Circumference --      Peak Flow --      Pain Score 10/27/20 0046 0     Pain Loc --      Pain Edu? --      Excl. in [place]? --    CONSTITUTIONAL: Alert and oriented and responds appropriately to questions. Well-appearing; well-nourished HEAD: Normocephalic EYES: Conjunctivae clear, pupils appear equal, EOM appear intact ENT: normal nose; moist mucous membranes NECK: Supple, normal ROM CARD: RRR; S1 and S2 appreciated; no murmurs, no clicks, no rubs, no gallops RESP: No respiratory distress.  Speaking full sentences.  Diffuse inspiratory and expiratory wheezing.  No rhonchi or rales.  No hypoxia. ABD/GI: Normal bowel sounds; non-distended; soft, non-tender, no rebound, no guarding, no peritoneal signs, no hepatosplenomegaly BACK: The back appears normal EXT: Normal ROM in all joints; no deformity noted, no edema; no cyanosis, no calf tenderness or calf swelling SKIN: Normal color for age and race; warm; no rash on exposed skin NEURO: Moves all extremities equally PSYCH: The patient's mood and manner are  appropriate.  ____________________________________________   LABS (all labs ordered are listed, but only abnormal results are displayed)  Labs Reviewed  BASIC METABOLIC PANEL - Abnormal; Notable for the following components:      Result Value   Sodium 134 (*)    Potassium 3.3 (*)    Glucose, Bld 303 (*)    Calcium 8.6 (*)    All other components within normal limits  SARS CORONAVIRUS 2 (TAT 6-24 HRS)  CBC WITH DIFFERENTIAL/PLATELET  BRAIN NATRIURETIC PEPTIDE   ____________________________________________  EKG   EKG Interpretation  Date/Time:  Sunday October 27 2020 01:00:35 EST Ventricular Rate:  79 PR Interval:    QRS Duration: 84 QT Interval:  379 QTC Calculation: 435 R Axis:   31 Text Interpretation: Sinus rhythm Borderline repol abnormality, diffuse leads Baseline wander in lead(s) V5 V6 Confirmed by Pryor Curia [phone]) on 10/27/2020 1:25:35 AM       ____________________________________________  RADIOLOGY Jessie Foot Naithan Delage, personally viewed and evaluated these images (plain radiographs) as part of my medical decision making, as well as reviewing the written report by the radiologist.  ED MD interpretation: Chest x-ray shows central bronchial thickening.  No infiltrate or edema.  Official radiology report(s): DG Chest 2 View  Result Date: 10/27/2020 CLINICAL DATA:  Shortness of breath and wheezing EXAM: CHEST - 2 VIEW COMPARISON:  07/07/2018 FINDINGS: Heart size is normal. Chronic aortic atherosclerosis is noted. Central bronchial thickening but no evidence of infiltrate, collapse or effusion. No air trapping. No significant bone finding. IMPRESSION: Central bronchial thickening. This could relate to bronchitis or asthma. No consolidation or collapse. Electronically Signed   By: Nelson Chimes M.D.   On: 10/27/2020 01:58    ____________________________________________   PROCEDURES  Procedure(s) performed (including Critical Care):  Procedures  CRITICAL  CARE Performed by: Cyril Mourning Bailley [place]   Total critical care time: 45 minutes  Critical care time was exclusive of separately billable procedures and treating other patients.  Critical care was necessary to treat or prevent imminent or life-threatening deterioration.  Critical care was time spent personally by me on the following activities: development of treatment plan with patient and/or surrogate as well as nursing, discussions with consultants, evaluation of patient's response to treatment, examination of patient, obtaining history from patient or surrogate, ordering and performing treatments and interventions, ordering and review of laboratory studies, ordering and review of radiographic studies, pulse oximetry and re-evaluation of patient's condition.  ____________________________________________   INITIAL IMPRESSION / ASSESSMENT AND PLAN / ED COURSE  As part of my medical decision making, I reviewed the following data within the [place] History obtained from family, Nursing notes reviewed and incorporated, Interpreter needed, Labs reviewed , Radiograph reviewed  and Notes from prior ED visits         Patient here with wheezing.  No previous history of asthma, COPD, bronchospasm.  Does not appear volume overloaded.  Denies any chest pain.  No fevers.  EKG here nonischemic.  Will obtain labs, chest x-ray.  Will give breathing treatments, steroids.  ED PROGRESS  Labs unremarkable other than glucose of 303.  She is not in DKA today.  Her BNP is normal.  Her chest x-ray concerning for acute bronchitis.  Discussed with patient that this could be due to viral illness but will also cover for possible bacterial infection.  Will give azithromycin prescription and discharged on steroids and with albuterol inhaler.  Have advised her to watch her blood sugars very closely while on steroids.  She reports feeling much better after 3 DuoNebs and 1 albuterol treatment.  She is not  hypoxic at rest or with ambulation.  She still has some scattered expiratory wheezes but her breath sounds have markedly improved compared to previous and she states she is feeling better and comfortable with plan for discharge home.  Recommended close follow-up with her primary care doctor.  Provided with albuterol inhaler here from the emergency department.  Patient and husband verbalized understanding and are comfortable with this plan.  At this time, I do not feel there is any life-threatening condition present. I have reviewed, interpreted and discussed all results (EKG, imaging, lab, urine as appropriate) and exam findings with patient/family. I  have reviewed nursing notes and appropriate previous records.  I feel the patient is safe to be discharged home without further emergent workup and can continue workup as an outpatient as needed. Discussed usual and customary return precautions. Patient/family verbalize understanding and are comfortable with this plan.  Outpatient follow-up has been provided as needed. All questions have been answered.  ____________________________________________   FINAL CLINICAL IMPRESSION(S) / ED DIAGNOSES  Final diagnoses:  Bronchospasm with bronchitis, acute     ED Discharge Orders         Ordered    predniSONE (DELTASONE) 20 MG tablet  Daily        10/27/20 0413    albuterol (VENTOLIN HFA) 108 (90 Base) MCG/ACT inhaler  Every 4 hours PRN        10/27/20 0413    azithromycin (ZITHROMAX Z-PAK) 250 MG tablet        10/27/20 0413          *Please note:  Caitlyn [name] was evaluated in Emergency Department on 10/27/2020 for the symptoms described in the history of present illness. She was evaluated in the context of the global COVID-19 pandemic, which necessitated consideration that the patient might be at risk for infection with the SARS-CoV-2 virus that causes COVID-19. Institutional protocols and algorithms that pertain to the evaluation of patients  at risk for COVID-19 are in a state of rapid change based on information released by regulatory bodies including the CDC and federal and state organizations. These policies and algorithms were followed during the patient's care in the ED.  Some ED evaluations and interventions may be delayed as a result of limited staffing during and the pandemic.*   Note:  This document was prepared using Dragon voice recognition software and may include unintentional dictation errors.   Khizar Fiorella, Delice Bison, DO 10/27/20 [phone]

## 2021-01-20 ENCOUNTER — Emergency Department
Admission: EM | Admit: 2021-01-20 | Discharge: 2021-01-20 | Disposition: A | Discharge status: Home / Self Care | Payer: Medicare Other | Attending: Emergency Medicine | Admitting: Emergency Medicine

## 2021-01-20 ENCOUNTER — Other Ambulatory Visit: Payer: Self-pay

## 2021-01-20 ENCOUNTER — Encounter: Payer: Self-pay | Admitting: Emergency Medicine

## 2021-01-20 DIAGNOSIS — H1032 Unspecified acute conjunctivitis, left eye: Secondary | ICD-10-CM | POA: Diagnosis not present

## 2021-01-20 DIAGNOSIS — Z96653 Presence of artificial knee joint, bilateral: Secondary | ICD-10-CM | POA: Insufficient documentation

## 2021-01-20 DIAGNOSIS — Z8616 Personal history of COVID-19: Secondary | ICD-10-CM | POA: Insufficient documentation

## 2021-01-20 DIAGNOSIS — H5712 Ocular pain, left eye: Secondary | ICD-10-CM | POA: Diagnosis present

## 2021-01-20 DIAGNOSIS — I10 Essential (primary) hypertension: Secondary | ICD-10-CM | POA: Insufficient documentation

## 2021-01-20 DIAGNOSIS — Z87891 Personal history of nicotine dependence: Secondary | ICD-10-CM | POA: Insufficient documentation

## 2021-01-20 DIAGNOSIS — H109 Unspecified conjunctivitis: Secondary | ICD-10-CM

## 2021-01-20 DIAGNOSIS — Z79899 Other long term (current) drug therapy: Secondary | ICD-10-CM | POA: Diagnosis not present

## 2021-01-20 MED ORDER — GENTAMICIN SULFATE 0.3 % OP SOLN: 1.0000 [drp] | OPHTHALMIC | 0 refills | Status: DC

## 2021-01-20 MED ORDER — NAPHAZOLINE-PHENIRAMINE 0.025-0.3 % OP SOLN: 1.0000 [drp] | Freq: Four times a day (QID) | OPHTHALMIC | 0 refills | Status: DC | PRN

## 2021-01-20 NOTE — ED Notes (Signed)
Left eye red for 1-2 weeks.   Pain and itchy as well.

## 2021-01-20 NOTE — Discharge Instructions (Addendum)
Read and follow discharge care instructions.  Use eyedrops as directed. 

## 2021-01-20 NOTE — ED Triage Notes (Signed)
Pt to ED via POV with c/o L eye redness and pain x 10 days, redness and clear drainage noted at this time.

## 2021-01-20 NOTE — ED Provider Notes (Signed)
[facility] Emergency Department Provider Note   ____________________________________________   Event Date/Time   First MD Initiated Contact with Patient 01/20/21 1112     (approximate)  I have reviewed the triage vital signs and the nursing notes.   HISTORY  Chief Complaint No chief complaint on file.    HPI Via interpreter Caitlyn [name] is a 55 y.o. female patient complain of left eye pain redness and pain that is increased over the past 10 days.  She states in the last 2 days she is awaking with matted eyelids requiring warm compresses to dislodge so she can open the eye.  Patient there is exasperated to the right eye.  Patient denies loss of vision.  Patient rates the pain as a 6/10.  Describes the pain as "achy".  Mild relief with over-the-counter eyedrops.         Past Medical History:  Diagnosis Date  . Anemia   . Anemia   . Anxiety   . Arthritis   . COVID-19   . Depression   . Dyspepsia   . GERD (gastroesophageal reflux disease)   . Heart burn   . Hypertension   . Irregular menstrual cycle     Patient Active Problem List   Diagnosis Date Noted  . Chronic pain syndrome 10/16/2019  . Cervicalgia 10/16/2019  . Chronic neck pain (1ry area of Pain) (Bilateral) (L>R) 10/16/2019  . Chronic shoulder pain (Left) 10/16/2019  . Chronic upper extremity pain (Bilateral) 10/16/2019  . Chronic low back pain (Bilateral) w/o sciatica 10/16/2019  . Chronic lower extremity pain (Bilateral) (R>L) 10/16/2019  . 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 Pharmacologic therapy 10/16/2019  . Disorder of skeletal system 10/16/2019  . Problems influencing health status 10/16/2019  . DDD (degenerative disc disease), cervical 10/16/2019  . DDD (degenerative disc disease), lumbosacral 10/16/2019  . Abnormal MRI, cervical spine (09/29/2019) 10/16/2019  . Bursitis of left shoulder 08/10/2019  . Left cervical  radiculopathy 08/10/2019  . Bilateral arm pain 01/11/2019  . Vitamin D deficiency 05/03/2018  . Nonrheumatic aortic valve insufficiency 02/28/2018  . Non-rheumatic mitral regurgitation 02/28/2018  . Non-rheumatic tricuspid valve insufficiency 02/28/2018  . Heart palpitations 02/07/2018  . Hypertension 02/07/2018  . Chronic pain of wrist (Left) 10/27/2017  . Myofascial muscle pain 10/27/2017  . Synovitis of left knee 10/21/2017  . Chest pain on breathing 05/27/2017  . Dizziness 05/27/2017  . Episodic lightheadedness 05/27/2017  . Myalgia 05/12/2017  . Primary localized osteoarthritis of left knee 03/16/2017  . Surgical menopause, symptomatic 01/26/2017  . Status post abdominal hysterectomy and left salpingo-oophorectomy 01/26/2017  . S/P TAH (total abdominal hysterectomy) 01/20/2017  . 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 Primary localized osteoarthritis of right knee 07/07/2016  . Abnormal hepatitis serology 04/10/2016  . Polyarthralgia 04/03/2016  . Bilateral anterior knee pain 04/03/2016  . Anemia 10/23/2015    Past Surgical History:  Procedure Laterality Date  . ABDOMINAL HYSTERECTOMY Left 01/18/2017   Procedure: HYSTERECTOMY ABDOMINAL WITH LEFT SALPINGO OOPHERECTOMY;  Surgeon: Brayton Mars, MD;  Location: [facility] ORS;  Service: Gynecology;  Laterality: Left;  . CHOLECYSTECTOMY    . CHOLECYSTECTOMY, LAPAROSCOPIC    . COLONOSCOPY WITH PROPOFOL N/A 11/04/2017   Procedure: COLONOSCOPY WITH PROPOFOL;  Surgeon: Lollie Sails, MD;  Location: [facility] ENDOSCOPY;  Service: Endoscopy;  Laterality: N/A;  . ESOPHAGOGASTRODUODENOSCOPY N/A 11/04/2017   Procedure: ESOPHAGOGASTRODUODENOSCOPY (EGD);  Surgeon: Lollie Sails, MD;  Location: [facility]COPY;  Service: Endoscopy;  Laterality:  N/A;  . ESOPHAGOGASTRODUODENOSCOPY    . ESOPHAGOGASTRODUODENOSCOPY (EGD) WITH PROPOFOL N/A 05/30/2019   Procedure: ESOPHAGOGASTRODUODENOSCOPY (EGD) WITH PROPOFOL;  Surgeon: Toledo, Benay Pike, MD;  Location:  [facility] ENDOSCOPY;  Service: Gastroenterology;  Laterality: N/A;  . JOINT REPLACEMENT    . KNEE ARTHROSCOPY Left 12/03/2016   Procedure: ARTHROSCOPY KNEE, partial medial menisectomy;  Surgeon: Hessie Knows, MD;  Location: [facility] ORS;  Service: Orthopedics;  Laterality: Left;  . KNEE ARTHROTOMY Left 10/21/2017   Procedure: KNEE ARTHROTOMY LEFT LATERAL RELEASE MEDIAL RETINACULART REPAIR POLY EXCHANGE;  Surgeon: Hessie Knows, MD;  Location: [facility] ORS;  Service: Orthopedics;  Laterality: Left;  . KNEE CLOSED REDUCTION Left 04/15/2017   Procedure: CLOSED MANIPULATION KNEE;  Surgeon: Hessie Knows, MD;  Location: [facility] ORS;  Service: Orthopedics;  Laterality: Left;  . LAPAROSCOPIC OVARIAN CYSTECTOMY Right   . MENISCECTOMY Right   . TOTAL KNEE ARTHROPLASTY Right 07/07/2016   Procedure: TOTAL KNEE ARTHROPLASTY;  Surgeon: Hessie Knows, MD;  Location: [facility] ORS;  Service: Orthopedics;  Laterality: Right;  . TOTAL KNEE ARTHROPLASTY Left 03/16/2017   Procedure: TOTAL KNEE ARTHROPLASTY;  Surgeon: Hessie Knows, MD;  Location: [facility] ORS;  Service: Orthopedics;  Laterality: Left;  . TUBAL LIGATION      Prior to Admission medications   Medication Sig Start Date End Date Taking? Authorizing Provider  gentamicin (GARAMYCIN) 0.3 % ophthalmic solution Place 1 drop into both eyes every 4 (four) hours. 01/20/21  Yes Sable Feil, PA-C  lisinopril-hydrochlorothiazide (ZESTORETIC) 20-25 MG tablet Take 1 tablet by mouth daily. 09/28/19  Yes [provider]  metoprolol succinate (TOPROL-XL) 25 MG 24 hr tablet Take 25 mg by mouth daily. 10/09/18  Yes [provider]  naphazoline-pheniramine (NAPHCON-A) 0.025-0.3 % ophthalmic solution Place 1 drop into both eyes 4 (four) times daily as needed for eye irritation. 01/20/21  Yes Sable Feil, PA-C  albuterol (VENTOLIN HFA) 108 (90 Base) MCG/ACT inhaler Inhale 2 puffs into the lungs every 4 (four) hours as needed for wheezing or shortness of breath. 10/27/20   Ward,  Delice Bison, DO  azithromycin (ZITHROMAX Z-PAK) 250 MG tablet Take 500 mg on your first day and then every day after take 250 mg. 10/27/20   Ward, Delice Bison, DO  cyclobenzaprine (FLEXERIL) 5 MG tablet Take 5 mg by mouth 3 (three) times daily as needed. 09/19/19   [provider]  DICLOFENAC PO Take by mouth.    [provider]  gabapentin (NEURONTIN) 100 MG capsule Take 100 mg by mouth 2 (two) times daily.    [provider]  omeprazole (PRILOSEC) 40 MG capsule  05/12/19   [provider]  pantoprazole (PROTONIX) 40 MG tablet Take 40 mg by mouth 2 (two) times daily as needed.     [provider]  predniSONE (DELTASONE) 20 MG tablet Take 2 tablets (40 mg total) by mouth daily. 10/27/20   Ward, Delice Bison, DO  sertraline (ZOLOFT) 50 MG tablet Take 50 mg by mouth daily.     [provider]  tiZANidine (ZANAFLEX) 2 MG tablet Take 2 mg by mouth 3 times/day as needed-between meals & bedtime.     [provider]  traMADol (ULTRAM) 50 MG tablet Take 1 tablet (50 mg total) by mouth as needed. Patient taking differently: Take 50 mg by mouth 2 (two) times daily as needed for moderate pain.  03/18/17   Reche Dixon, PA-C  triamcinolone ointment (KENALOG) 0.5 % APPLY  OINTMENT TOPICALLY TO AFFECTED AREA AT BEDTIME 11/29/19  Harlin Heys, MD  Vitamin D, Ergocalciferol, (DRISDOL) 50000 units CAPS capsule Take 50,000 Units by mouth every 7 (seven) days.    [provider]  zolpidem (AMBIEN) 10 MG tablet Take 10 mg by mouth at bedtime.     [provider]    Allergies Patient has no known allergies.  Family History  Problem Relation Age of Onset  . Osteoarthritis Mother   . Heart failure Father   . Diabetes Father   . Hypertension Father   . Osteoarthritis Sister   . Breast cancer Neg Hx   . Ovarian cancer Neg Hx   . Colon cancer Neg Hx     Social History Social History   Tobacco Use  . Smoking status: Former Smoker     Packs/day: 0.50    Types: Cigarettes    Quit date: 03/01/2015    Years since quitting: 5.8  . Smokeless tobacco: Never Used  Vaping Use  . Vaping Use: Never used  Substance Use Topics  . Alcohol use: Yes    Alcohol/week: 0.0 standard drinks    Comment: occasionally  . Drug use: No    Review of Systems  Constitutional: No fever/chills Eyes: No visual changes.  Matted eyelids. ENT: No sore throat. Cardiovascular: Denies chest pain. Respiratory: Denies shortness of breath. Gastrointestinal: No abdominal pain.  No nausea, no vomiting.  No diarrhea.  No constipation. Genitourinary: Negative for dysuria. Musculoskeletal: Negative for back pain. Skin: Negative for rash. Neurological: Negative for headaches, focal weakness or numbness. Psychiatric:  Anxiety and depression Endocrine:  Hypertension  ____________________________________________   PHYSICAL EXAM:  VITAL SIGNS: ED Triage Vitals  Enc Vitals Group     BP 01/20/21 1020 (!) 160/84     Pulse Rate 01/20/21 1020 (!) 59     Resp 01/20/21 1020 20     Temp 01/20/21 1020 98.5 F (36.9 C)     Temp Source 01/20/21 1020 Oral     SpO2 01/20/21 1020 96 %     Weight 01/20/21 1019 230 lb (104.3 kg)     Height 01/20/21 1019 5\' 3"  (1.6 m)     Head Circumference --      Peak Flow --      Pain Score 01/20/21 1020 6     Pain Loc --      Pain Edu? --      Excl. in [place]? --     Constitutional: Alert and oriented. Well appearing and in no acute distress. Eyes: Conjunctivae are tenderness with yellow/green discharge.  PERRL. EOMI. Cardiovascular: Normal rate, regular rhythm. Grossly normal heart sounds.  Good peripheral circulation.  Elevated blood pressure Respiratory: Normal respiratory effort.  No retractions. Lungs CTAB. Neurologic:  Normal speech and language. No gross focal neurologic deficits are appreciated. No gait instability. Skin:  Skin is warm, dry and intact. No rash noted. Psychiatric: Mood and affect are normal. Speech  and behavior are normal.  ____________________________________________   LABS (all labs ordered are listed, but only abnormal results are displayed)  Labs Reviewed - No data to display ____________________________________________  EKG   ____________________________________________  RADIOLOGY I, Sable Feil, personally viewed and evaluated these images (plain radiographs) as part of my medical decision making, as well as reviewing the written report by the radiologist.  ED MD interpretation:    Official radiology report(s): No results found.  ____________________________________________   PROCEDURES  Procedure(s) performed (including Critical Care):  Procedures   ____________________________________________   INITIAL IMPRESSION / ASSESSMENT AND PLAN /  ED COURSE  As part of my medical decision making, I reviewed the following data within the [place]         Patient presents with bilateral matted eyelids consistent with bacterial conjunctivitis.  Patient given discharge care instruction advised use eyedrops as directed.  Follow-up with PCP.      ____________________________________________   FINAL CLINICAL IMPRESSION(S) / ED DIAGNOSES  Final diagnoses:  Bacterial conjunctivitis of left eye     ED Discharge Orders         Ordered    gentamicin (GARAMYCIN) 0.3 % ophthalmic solution  Every 4 hours        01/20/21 1242    naphazoline-pheniramine (NAPHCON-A) 0.025-0.3 % ophthalmic solution  4 times daily PRN        01/20/21 1242          *Please note:  Caitlyn [name] was evaluated in Emergency Department on 01/20/2021 for the symptoms described in the history of present illness. She was evaluated in the context of the global COVID-19 pandemic, which necessitated consideration that the patient might be at risk for infection with the SARS-CoV-2 virus that causes COVID-19. Institutional protocols and algorithms that pertain to the  evaluation of patients at risk for COVID-19 are in a state of rapid change based on information released by regulatory bodies including the CDC and federal and state organizations. These policies and algorithms were followed during the patient's care in the ED.  Some ED evaluations and interventions may be delayed as a result of limited staffing during and the pandemic.*   Note:  This document was prepared using Dragon voice recognition software and may include unintentional dictation errors.    Sable Feil, PA-C 01/20/21 1246    Delman Kitten, MD 01/20/21 [id number]

## 2021-01-27 ENCOUNTER — Other Ambulatory Visit: Payer: Self-pay

## 2021-01-27 ENCOUNTER — Emergency Department
Admission: EM | Admit: 2021-01-27 | Discharge: 2021-01-27 | Disposition: A | Discharge status: Home / Self Care | Payer: Medicare Other | Attending: Emergency Medicine | Admitting: Emergency Medicine

## 2021-01-27 ENCOUNTER — Emergency Department: Payer: Medicare Other

## 2021-01-27 DIAGNOSIS — Z96653 Presence of artificial knee joint, bilateral: Secondary | ICD-10-CM | POA: Insufficient documentation

## 2021-01-27 DIAGNOSIS — Z87891 Personal history of nicotine dependence: Secondary | ICD-10-CM | POA: Insufficient documentation

## 2021-01-27 DIAGNOSIS — M779 Enthesopathy, unspecified: Secondary | ICD-10-CM | POA: Diagnosis not present

## 2021-01-27 DIAGNOSIS — Z8616 Personal history of COVID-19: Secondary | ICD-10-CM | POA: Insufficient documentation

## 2021-01-27 DIAGNOSIS — M25531 Pain in right wrist: Secondary | ICD-10-CM | POA: Diagnosis present

## 2021-01-27 DIAGNOSIS — Z79899 Other long term (current) drug therapy: Secondary | ICD-10-CM | POA: Diagnosis not present

## 2021-01-27 DIAGNOSIS — I10 Essential (primary) hypertension: Secondary | ICD-10-CM | POA: Diagnosis not present

## 2021-01-27 DIAGNOSIS — M778 Other enthesopathies, not elsewhere classified: Secondary | ICD-10-CM

## 2021-01-27 LAB — BASIC METABOLIC PANEL
Anion gap: 9 (ref 5–15)
BUN: 13 mg/dL (ref 6–20)
CO2: 26 mmol/L (ref 22–32)
Calcium: 8.9 mg/dL (ref 8.9–10.3)
Chloride: 102 mmol/L (ref 98–111)
Creatinine, Ser: 0.74 mg/dL (ref 0.44–1.00)
GFR, Estimated: >60 mL/min (ref >60)
Glucose, Bld: 249 mg/dL — ABNORMAL HIGH (ref 70–99)
Potassium: 3.9 mmol/L (ref 3.5–5.1)
Sodium: 137 mmol/L (ref 135–145)

## 2021-01-27 LAB — URIC ACID: Uric Acid, Serum: 5.6 mg/dL (ref 2.5–7.1)

## 2021-01-27 MED ORDER — HYDROCODONE-ACETAMINOPHEN 5-325 MG PO TABS
1.0000 | ORAL_TABLET | Freq: Once | ORAL | Status: AC
  Administered 2021-01-27: 1 via ORAL
  Filled 2021-01-27: qty 1

## 2021-01-27 MED ORDER — METHYLPREDNISOLONE 4 MG PO TBPK: ORAL_TABLET | ORAL | 0 refills | Status: DC

## 2021-01-27 NOTE — ED Triage Notes (Signed)
Pt comes with c/o right arm pain and wrist pain. Pt states pain and swelling. Pt denies any known injury.  Pt states trouble moving fingers. Pt states it started two days ago.

## 2021-01-27 NOTE — Discharge Instructions (Addendum)
You do not have gout, Your glucose is elevated today  Watch your glucose closely while taking the steroid Apply ice to the right wrist Follow up with your regular doctor or orthopedics

## 2021-01-27 NOTE — ED Notes (Signed)
See triage note  Presents with right wrist pain for 2 days  Denies any injury  Min swelling noted  Area is tender to touch  Good pulses

## 2021-01-27 NOTE — ED Provider Notes (Signed)
[facility] Emergency Department Provider Note  ____________________________________________   Event Date/Time   First MD Initiated Contact with Patient 01/27/21 [phone]     (approximate)  I have reviewed the triage vital signs and the nursing notes.   HISTORY  Chief Complaint Arm Injury    HPI Caitlyn [name] is a 55 y.o. female presents emergency department complaining of right wrist and arm pain.  No known injury.  No recent IV or blood draw on this arm.  Patient has history of chronic pain syndrome and has been evaluated at the pain clinic from [facility].  She states it is very tender along the thumb that radiates up into the elbow.  Pain is increased with movement.  No numbness or tingling    Past Medical History:  Diagnosis Date  . Anemia   . Anemia   . Anxiety   . Arthritis   . COVID-19   . Depression   . Dyspepsia   . GERD (gastroesophageal reflux disease)   . Heart burn   . Hypertension   . Irregular menstrual cycle     Patient Active Problem List   Diagnosis Date Noted  . Chronic pain syndrome 10/16/2019  . Cervicalgia 10/16/2019  . Chronic neck pain (1ry area of Pain) (Bilateral) (L>R) 10/16/2019  . Chronic shoulder pain (Left) 10/16/2019  . Chronic upper extremity pain (Bilateral) 10/16/2019  . Chronic low back pain (Bilateral) w/o sciatica 10/16/2019  . Chronic lower extremity pain (Bilateral) (R>L) 10/16/2019  . 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 Pharmacologic therapy 10/16/2019  . Disorder of skeletal system 10/16/2019  . Problems influencing health status 10/16/2019  . DDD (degenerative disc disease), cervical 10/16/2019  . DDD (degenerative disc disease), lumbosacral 10/16/2019  . Abnormal MRI, cervical spine (09/29/2019) 10/16/2019  . Bursitis of left shoulder 08/10/2019  . Left cervical radiculopathy 08/10/2019  . Bilateral arm pain 01/11/2019  . Vitamin D deficiency 05/03/2018   . Nonrheumatic aortic valve insufficiency 02/28/2018  . Non-rheumatic mitral regurgitation 02/28/2018  . Non-rheumatic tricuspid valve insufficiency 02/28/2018  . Heart palpitations 02/07/2018  . Hypertension 02/07/2018  . Chronic pain of wrist (Left) 10/27/2017  . Myofascial muscle pain 10/27/2017  . Synovitis of left knee 10/21/2017  . Chest pain on breathing 05/27/2017  . Dizziness 05/27/2017  . Episodic lightheadedness 05/27/2017  . Myalgia 05/12/2017  . Primary localized osteoarthritis of left knee 03/16/2017  . Surgical menopause, symptomatic 01/26/2017  . Status post abdominal hysterectomy and left salpingo-oophorectomy 01/26/2017  . S/P TAH (total abdominal hysterectomy) 01/20/2017  . 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 Primary localized osteoarthritis of right knee 07/07/2016  . Abnormal hepatitis serology 04/10/2016  . Polyarthralgia 04/03/2016  . Bilateral anterior knee pain 04/03/2016  . Anemia 10/23/2015    Past Surgical History:  Procedure Laterality Date  . ABDOMINAL HYSTERECTOMY Left 01/18/2017   Procedure: HYSTERECTOMY ABDOMINAL WITH LEFT SALPINGO OOPHERECTOMY;  Surgeon: Brayton Mars, MD;  Location: [facility] ORS;  Service: Gynecology;  Laterality: Left;  . CHOLECYSTECTOMY    . CHOLECYSTECTOMY, LAPAROSCOPIC    . COLONOSCOPY WITH PROPOFOL N/A 11/04/2017   Procedure: COLONOSCOPY WITH PROPOFOL;  Surgeon: Lollie Sails, MD;  Location: [facility] ENDOSCOPY;  Service: Endoscopy;  Laterality: N/A;  . ESOPHAGOGASTRODUODENOSCOPY N/A 11/04/2017   Procedure: ESOPHAGOGASTRODUODENOSCOPY (EGD);  Surgeon: Lollie Sails, MD;  Location: [facility] ENDOSCOPY;  Service: Endoscopy;  Laterality: N/A;  . ESOPHAGOGASTRODUODENOSCOPY    . ESOPHAGOGASTRODUODENOSCOPY (EGD) WITH PROPOFOL N/A 05/30/2019   Procedure: ESOPHAGOGASTRODUODENOSCOPY (EGD)  WITH PROPOFOL;  Surgeon: Toledo, Benay Pike, MD;  Location: [facility] ENDOSCOPY;  Service: Gastroenterology;  Laterality: N/A;  . JOINT REPLACEMENT    . KNEE  ARTHROSCOPY Left 12/03/2016   Procedure: ARTHROSCOPY KNEE, partial medial menisectomy;  Surgeon: Hessie Knows, MD;  Location: [facility] ORS;  Service: Orthopedics;  Laterality: Left;  . KNEE ARTHROTOMY Left 10/21/2017   Procedure: KNEE ARTHROTOMY LEFT LATERAL RELEASE MEDIAL RETINACULART REPAIR POLY EXCHANGE;  Surgeon: Hessie Knows, MD;  Location: [facility] ORS;  Service: Orthopedics;  Laterality: Left;  . KNEE CLOSED REDUCTION Left 04/15/2017   Procedure: CLOSED MANIPULATION KNEE;  Surgeon: Hessie Knows, MD;  Location: [facility] ORS;  Service: Orthopedics;  Laterality: Left;  . LAPAROSCOPIC OVARIAN CYSTECTOMY Right   . MENISCECTOMY Right   . TOTAL KNEE ARTHROPLASTY Right 07/07/2016   Procedure: TOTAL KNEE ARTHROPLASTY;  Surgeon: Hessie Knows, MD;  Location: [facility] ORS;  Service: Orthopedics;  Laterality: Right;  . TOTAL KNEE ARTHROPLASTY Left 03/16/2017   Procedure: TOTAL KNEE ARTHROPLASTY;  Surgeon: Hessie Knows, MD;  Location: [facility] ORS;  Service: Orthopedics;  Laterality: Left;  . TUBAL LIGATION      Prior to Admission medications   Medication Sig Start Date End Date Taking? Authorizing Provider  methylPREDNISolone (MEDROL DOSEPAK) 4 MG TBPK tablet Take 6 pills on day one then decrease by 1 pill each day 01/27/21  Yes Kaylon Hitz, Linden Dolin, PA-C  albuterol (VENTOLIN HFA) 108 (90 Base) MCG/ACT inhaler Inhale 2 puffs into the lungs every 4 (four) hours as needed for wheezing or shortness of breath. 10/27/20   Ward, Delice Bison, DO  cyclobenzaprine (FLEXERIL) 5 MG tablet Take 5 mg by mouth 3 (three) times daily as needed. 09/19/19   [provider]  DICLOFENAC PO Take by mouth.    [provider]  gabapentin (NEURONTIN) 100 MG capsule Take 100 mg by mouth 2 (two) times daily.    [provider]  gentamicin (GARAMYCIN) 0.3 % ophthalmic solution Place 1 drop into both eyes every 4 (four) hours. 01/20/21   Sable Feil, PA-C  lisinopril-hydrochlorothiazide (ZESTORETIC) 20-25 MG tablet Take 1 tablet  by mouth daily. 09/28/19   [provider]  metoprolol succinate (TOPROL-XL) 25 MG 24 hr tablet Take 25 mg by mouth daily. 10/09/18   [provider]  naphazoline-pheniramine (NAPHCON-A) 0.025-0.3 % ophthalmic solution Place 1 drop into both eyes 4 (four) times daily as needed for eye irritation. 01/20/21   Sable Feil, PA-C  omeprazole (PRILOSEC) 40 MG capsule  05/12/19   [provider]  pantoprazole (PROTONIX) 40 MG tablet Take 40 mg by mouth 2 (two) times daily as needed.     [provider]  sertraline (ZOLOFT) 50 MG tablet Take 50 mg by mouth daily.     [provider]  tiZANidine (ZANAFLEX) 2 MG tablet Take 2 mg by mouth 3 times/day as needed-between meals & bedtime.     [provider]  traMADol (ULTRAM) 50 MG tablet Take 1 tablet (50 mg total) by mouth as needed. Patient taking differently: Take 50 mg by mouth 2 (two) times daily as needed for moderate pain.  03/18/17   Reche Dixon, PA-C  triamcinolone ointment (KENALOG) 0.5 % APPLY  OINTMENT TOPICALLY TO AFFECTED AREA AT BEDTIME 11/29/19   Harlin Heys, MD  Vitamin D, Ergocalciferol, (DRISDOL) 50000 units CAPS capsule Take 50,000 Units by mouth every 7 (seven) days.    [provider]  zolpidem (AMBIEN) 10 MG tablet Take 10 mg by mouth at bedtime.  [provider]    Allergies Patient has no known allergies.  Family History  Problem Relation Age of Onset  . Osteoarthritis Mother   . Heart failure Father   . Diabetes Father   . Hypertension Father   . Osteoarthritis Sister   . Breast cancer Neg Hx   . Ovarian cancer Neg Hx   . Colon cancer Neg Hx     Social History Social History   Tobacco Use  . Smoking status: Former Smoker    Packs/day: 0.50    Types: Cigarettes    Quit date: 03/01/2015    Years since quitting: 5.9  . Smokeless tobacco: Never Used  Vaping Use  . Vaping Use: Never used  Substance Use Topics  . Alcohol use: Yes     Alcohol/week: 0.0 standard drinks    Comment: occasionally  . Drug use: No    Review of Systems  Constitutional: No fever/chills Eyes: No visual changes. ENT: No sore throat. Respiratory: Denies cough Genitourinary: Negative for dysuria. Musculoskeletal: Negative for back pain.  Positive for right wrist pain Skin: Negative for rash. Psychiatric: no mood changes,     ____________________________________________   PHYSICAL EXAM:  VITAL SIGNS: ED Triage Vitals  Enc Vitals Group     BP 01/27/21 0739 (!) 155/82     Pulse Rate 01/27/21 0739 68     Resp 01/27/21 0739 19     Temp 01/27/21 0739 97.8 F (36.6 C)     Temp Source 01/27/21 0739 Oral     SpO2 01/27/21 0739 100 %     Weight 01/27/21 0742 229 lb 15 oz (104.3 kg)     Height 01/27/21 0742 5\' 3"  (1.6 m)     Head Circumference --      Peak Flow --      Pain Score 01/27/21 0737 9     Pain Loc --      Pain Edu? --      Excl. in [place]? --     Constitutional: Alert and oriented. Well appearing and in no acute distress. Eyes: Conjunctivae are normal.  Head: Atraumatic. Nose: No congestion/rhinnorhea. Mouth/Throat: Mucous membranes are moist.   Neck:  supple no lymphadenopathy noted Cardiovascular: Normal rate, regular rhythm. Heart sounds are normal Respiratory: Normal respiratory effort.  No retractions, lungs c t a  GU: deferred Musculoskeletal: FROM all extremities, warm and well perfused, right wrist is tender at the radial aspect, pain is reproduced with movement, snuffbox is mildly tender, pain is reproduced with gripping Neurologic:  Normal speech and language.  Skin:  Skin is warm, dry and intact. No rash noted. Psychiatric: Mood and affect are normal. Speech and behavior are normal.  ____________________________________________   LABS (all labs ordered are listed, but only abnormal results are displayed)  Labs Reviewed  BASIC METABOLIC PANEL - Abnormal; Notable for the following components:      Result  Value   Glucose, Bld 249 (*)    All other components within normal limits  URIC ACID   ____________________________________________   ____________________________________________  RADIOLOGY  X-ray of the right wrist  ____________________________________________   PROCEDURES  Procedure(s) performed: No  Procedures    ____________________________________________   INITIAL IMPRESSION / ASSESSMENT AND PLAN / ED COURSE  Pertinent labs & imaging results that were available during my care of the patient were reviewed by me and considered in my medical decision making (see chart for details).   Patient is a 55 year old female presents with right wrist pain.  See HPI.  Physical exam shows patient appears stable.  X-ray of the right wrist reviewed by me confirmed by radiology to have no acute abnormality.  However radiologist does comment that it could possibly be gout.  Patient's basic metabolic panel shows elevated glucose, uric acid is normal  Patient is to follow-up with orthopedics Patient has a lot of chronic pain syndrome history in her past medical charts.    Patient was given pain medication while here in the ED.  She was placed in a wrist splint with thumb protector, she is to apply ice.  Return emergency department if worsening.  Discharged stable condition.     Caitlyn [name] was evaluated in Emergency Department on 01/27/2021 for the symptoms described in the history of present illness. She was evaluated in the context of the global COVID-19 pandemic, which necessitated consideration that the patient might be at risk for infection with the SARS-CoV-2 virus that causes COVID-19. Institutional protocols and algorithms that pertain to the evaluation of patients at risk for COVID-19 are in a state of rapid change based on information released by regulatory bodies including the CDC and federal and state organizations. These policies and algorithms were followed during  the patient's care in the ED.    As part of my medical decision making, I reviewed the following data within the [place] History obtained from family, Nursing notes reviewed and incorporated, Interpreter needed, Old chart reviewed, Radiograph reviewed , Notes from prior ED visits and [place] Controlled Substance Database  ____________________________________________   FINAL CLINICAL IMPRESSION(S) / ED DIAGNOSES  Final diagnoses:  Tendonitis of wrist, right      NEW MEDICATIONS STARTED DURING THIS VISIT:  Discharge Medication List as of 01/27/2021 10:29 AM    START taking these medications   Details  methylPREDNISolone (MEDROL DOSEPAK) 4 MG TBPK tablet Take 6 pills on day one then decrease by 1 pill each day, Normal         Note:  This document was prepared using Dragon voice recognition software and may include unintentional dictation errors.    Versie Starks, PA-C 01/27/21 1510    Lavonia Drafts, MD 01/27/21 [phone]

## 2021-09-21 ENCOUNTER — Emergency Department
Admission: EM | Admit: 2021-09-21 | Discharge: 2021-09-21 | Disposition: A | Discharge status: Home / Self Care | Payer: Medicare Other | Attending: Emergency Medicine | Admitting: Emergency Medicine

## 2021-09-21 ENCOUNTER — Encounter: Payer: Self-pay | Admitting: Emergency Medicine

## 2021-09-21 ENCOUNTER — Other Ambulatory Visit: Payer: Self-pay

## 2021-09-21 DIAGNOSIS — J029 Acute pharyngitis, unspecified: Secondary | ICD-10-CM | POA: Diagnosis present

## 2021-09-21 DIAGNOSIS — Z20822 Contact with and (suspected) exposure to covid-19: Secondary | ICD-10-CM | POA: Insufficient documentation

## 2021-09-21 DIAGNOSIS — H6982 Other specified disorders of Eustachian tube, left ear: Secondary | ICD-10-CM

## 2021-09-21 DIAGNOSIS — H6992 Unspecified Eustachian tube disorder, left ear: Secondary | ICD-10-CM | POA: Insufficient documentation

## 2021-09-21 DIAGNOSIS — J028 Acute pharyngitis due to other specified organisms: Secondary | ICD-10-CM

## 2021-09-21 DIAGNOSIS — B9689 Other specified bacterial agents as the cause of diseases classified elsewhere: Secondary | ICD-10-CM

## 2021-09-21 LAB — GROUP A STREP BY PCR: Group A Strep by PCR: NOT DETECTED

## 2021-09-21 LAB — RESP PANEL BY RT-PCR (FLU A&B, COVID) ARPGX2
Influenza A by PCR: NEGATIVE
Influenza B by PCR: NEGATIVE
SARS Coronavirus 2 by RT PCR: NEGATIVE

## 2021-09-21 MED ORDER — AMOXICILLIN 875 MG PO TABS: 875.0000 mg | ORAL_TABLET | Freq: Two times a day (BID) | ORAL | 0 refills | Status: DC

## 2021-09-21 MED ORDER — PREDNISONE 10 MG PO TABS: 30.0000 mg | ORAL_TABLET | Freq: Every day | ORAL | 0 refills | Status: DC

## 2021-09-21 NOTE — ED Notes (Signed)
Pt resting comfortably in bed. Sore throat, ear pain and cough for several days.  NAD. Respirations are even and non-labored

## 2021-09-21 NOTE — ED Provider Notes (Signed)
[facility] Provider Note    Event Date/Time   First MD Initiated Contact with Patient 09/21/21 1322     (approximate)   History   Sore Throat, Otalgia, and Cough   HPI  Caitlyn [name] is a 56 y.o. female present to the ER with complaints of sore throat, ear pain and cough for several days (3).  No fever, no cp/sob. No abd pain      Physical Exam   Triage Vital Signs: ED Triage Vitals  Enc Vitals Group     BP 09/21/21 1321 129/90     Pulse Rate 09/21/21 1321 75     Resp 09/21/21 1321 18     Temp 09/21/21 1329 98.9 F (37.2 C)     Temp Source 09/21/21 1329 Oral     SpO2 09/21/21 1321 97 %     Weight 09/21/21 1317 194 lb (88 kg)     Height 09/21/21 1317 5\' 1"  (1.549 m)     Head Circumference --      Peak Flow --      Pain Score 09/21/21 1317 5     Pain Loc --      Pain Edu? --      Excl. in [place]? --     Most recent vital signs: Vitals:   09/21/21 1321 09/21/21 1329  BP: 129/90   Pulse: 75 82  Resp: 18   Temp:  98.9 F (37.2 C)  SpO2: 97% 97%     General: Awake, no distress.  CV:  Good peripheral perfusion. regular rate and  rhythm Resp:  Normal effort. Lungs c t a Abd:  No distention.   Other:  Tms are dull and pink b/l, throat is mildly red, no exudate or swellng noted, neck is supple no lymph noted at cervical chain    ED Results / Procedures / Treatments   Labs (all labs ordered are listed, but only abnormal results are displayed) Labs Reviewed  GROUP A STREP BY PCR  RESP PANEL BY RT-PCR (FLU A&B, COVID) ARPGX2     EKG     RADIOLOGY     PROCEDURES:   Procedures   MEDICATIONS ORDERED IN ED: Medications - No data to display   IMPRESSION / MDM / [place] / ED COURSE  I reviewed the triage vital signs and the nursing notes.                              Differential diagnosis includes, but is not limited to, strep throat, COVID, influenza, otitis media, URI  Labs reassuring, patient  does not have strep throat or COVID/influenza.  Respiratory panel was shown to be negative.  Strep test is negative.  I did explain these findings to the patient.  Due to the amount of redness around the TM and the throat pain we did place her on amoxicillin and a 3-day burst of prednisone for 30 mg daily for 3 days.  Patient is in agreement with treatment plan.  She everything was conveyed via the video interpreter.  She was discharged in stable condition.  Instructions to follow-up with regular doctor if not improving in 3 days.  Return emergency department worsening.      FINAL CLINICAL IMPRESSION(S) / ED DIAGNOSES   Final diagnoses:  Acute bacterial pharyngitis  Eustachian tube dysfunction, left     Rx / DC Orders   ED Discharge Orders  Ordered    amoxicillin (AMOXIL) 875 MG tablet  2 times daily,   Status:  Discontinued        09/21/21 1435    predniSONE (DELTASONE) 10 MG tablet  Daily with breakfast        09/21/21 1435    amoxicillin (AMOXIL) 875 MG tablet  2 times daily        09/21/21 1435             Note:  This document was prepared using Dragon voice recognition software and may include unintentional dictation errors.    Versie Starks, PA-C 09/21/21 1446    Delman Kitten, MD 09/24/21 0530

## 2021-09-21 NOTE — ED Triage Notes (Signed)
Pt via POV from home. Pt c/o sore throat, L ear pain, and cough for the past 3 days that has gotten worse. Pt is A&OX4 and NAD.   Spanish interpreter needed.

## 2021-11-15 ENCOUNTER — Emergency Department
Admission: EM | Admit: 2021-11-15 | Discharge: 2021-11-15 | Disposition: A | Discharge status: Home / Self Care | Payer: Medicare Other | Attending: Emergency Medicine | Admitting: Emergency Medicine

## 2021-11-15 ENCOUNTER — Other Ambulatory Visit: Payer: Self-pay

## 2021-11-15 DIAGNOSIS — I10 Essential (primary) hypertension: Secondary | ICD-10-CM | POA: Insufficient documentation

## 2021-11-15 DIAGNOSIS — J029 Acute pharyngitis, unspecified: Secondary | ICD-10-CM | POA: Diagnosis present

## 2021-11-15 DIAGNOSIS — J020 Streptococcal pharyngitis: Secondary | ICD-10-CM | POA: Diagnosis not present

## 2021-11-15 DIAGNOSIS — E119 Type 2 diabetes mellitus without complications: Secondary | ICD-10-CM | POA: Diagnosis not present

## 2021-11-15 DIAGNOSIS — Z20822 Contact with and (suspected) exposure to covid-19: Secondary | ICD-10-CM | POA: Insufficient documentation

## 2021-11-15 LAB — RESP PANEL BY RT-PCR (FLU A&B, COVID) ARPGX2
Influenza A by PCR: NEGATIVE
Influenza B by PCR: NEGATIVE
SARS Coronavirus 2 by RT PCR: NEGATIVE

## 2021-11-15 LAB — GROUP A STREP BY PCR: Group A Strep by PCR: DETECTED — AB

## 2021-11-15 MED ORDER — PENICILLIN G BENZATHINE 1200000 UNIT/2ML IM SUSY
1.2000 10*6.[IU] | PREFILLED_SYRINGE | Freq: Once | INTRAMUSCULAR | Status: AC
  Administered 2021-11-15: 1.2 10*6.[IU] via INTRAMUSCULAR
  Filled 2021-11-15: qty 2

## 2021-11-15 MED ORDER — DEXAMETHASONE 4 MG PO TABS
10.0000 mg | ORAL_TABLET | Freq: Once | ORAL | Status: AC
  Administered 2021-11-15: 10 mg via ORAL
  Filled 2021-11-15: qty 3

## 2021-11-15 MED ORDER — ACETAMINOPHEN 325 MG PO TABS
650.0000 mg | ORAL_TABLET | Freq: Once | ORAL | Status: AC | PRN
  Administered 2021-11-15: 650 mg via ORAL
  Filled 2021-11-15: qty 2

## 2021-11-15 NOTE — ED Notes (Signed)
Pt AOX4, NAD noted. No redness or swelling of the tonsils/throat noted, airway is patent. Pt can speak without difficulty/muffling. Respirations even and unlabored.  ?

## 2021-11-15 NOTE — Discharge Instructions (Addendum)
-  Follow-up with your primary care provider as needed if your symptoms worsen. ?-Return to the emergency department anytime if you begin to experience any new or worsening symptoms. ?-Avoid contact with other people for at least 5 days to avoid spreading the bacteria. ?

## 2021-11-15 NOTE — ED Provider Notes (Signed)
? ?[facility] ?Provider Note ? ? ? Event Date/Time  ? First MD Initiated Contact with Patient 11/15/21 1103   ?  (approximate) ? ? ?History  ? ?Chief Complaint ?Sore Throat ? ? ?HPI ?Caitlyn [name] is a 56 y.o. female, history of hypertension, diabetes, mitral regurgitation, presents to the emergency department for evaluation of sore throat.  Patient states that she has been experiencing sore throat and fever for the past 24 hours.  Reports headache and aching in her ears as well.  She states that she has been around some of her sick kids who have had vomiting/diarrhea, but otherwise no known sick contacts.  Denies chest pain, shortness of breath, abdominal pain, urinary symptoms, back pain, numbness/tingling in upper or lower extremities, or rashes.  ? ?History Limitations: No limitations ? ?  ? ? ?Physical Exam  ?Triage Vital Signs: ?ED Triage Vitals  ?Enc Vitals Group  ?   BP 11/15/21 1048 136/90  ?   Pulse Rate 11/15/21 1048 (!) 101  ?   Resp 11/15/21 1048 20  ?   Temp 11/15/21 1048 (!) 101.6 ?F (38.7 ?C)  ?   Temp Source 11/15/21 1048 Oral  ?   SpO2 11/15/21 1048 99 %  ?   Weight 11/15/21 1049 190 lb (86.2 kg)  ?   Height 11/15/21 1049 '5\' 4"'$  (1.626 m)  ?   Head Circumference --   ?   Peak Flow --   ?   Pain Score 11/15/21 1048 10  ?   Pain Loc --   ?   Pain Edu? --   ?   Excl. in [place]? --   ? ? ?Most recent vital signs: ?Vitals:  ? 11/15/21 1048 11/15/21 1115  ?BP: 136/90 133/86  ?Pulse: (!) 101 93  ?Resp: 20 18  ?Temp: (!) 101.6 ?F (38.7 ?C)   ?SpO2: 99% 95%  ? ? ?General: Awake, NAD.  ?Skin: Warm, dry.  No rashes or lesions. ?CV: Good peripheral perfusion.  ?Resp: Normal effort.  Lungs are clear bilaterally in the apices and bases. ?Abd: Soft, non-tender. No distention.  ?Neuro: At baseline. No gross neurological deficits.  ?Other: Oropharynx erythematous with bilateral tonsillar swelling.  Exudates present.  Uvula midline.  Significant tenderness when palpating the submandibular  lymph nodes bilaterally, more so on the right side.  TMs erythematous bilaterally, no bulging. ? ?Physical Exam ? ? ? ?ED Results / Procedures / Treatments  ?Labs ?(all labs ordered are listed, but only abnormal results are displayed) ?Labs Reviewed  ?GROUP A STREP BY PCR - Abnormal; Notable for the following components:  ?    Result Value  ? Group A Strep by PCR DETECTED (*)   ? All other components within normal limits  ?RESP PANEL BY RT-PCR (FLU A&B, COVID) [id number]  ? ? ? ?EKG ?Not applicable ? ? ?RADIOLOGY ? ?ED Provider Interpretation: Not applicable. ? ?No results found. ? ?PROCEDURES: ? ?Critical Care performed: None. ? ?Procedures ? ? ? ?MEDICATIONS ORDERED IN ED: ?Medications  ?acetaminophen (TYLENOL) tablet 650 mg (650 mg Oral Given 11/15/21 1052)  ?penicillin g benzathine (BICILLIN LA) 1200000 UNIT/2ML injection 1.2 Million Units (1.2 Million Units Intramuscular Given 11/15/21 1226)  ?dexamethasone (DECADRON) tablet 10 mg (10 mg Oral Given 11/15/21 1223)  ? ? ? ?IMPRESSION / MDM / ASSESSMENT AND PLAN / ED COURSE  ?I reviewed the triage vital signs and the nursing notes. ?             ?               ? ? ?  Differential diagnosis includes, but is not limited to, influenza, COVID-19, strep pharyngitis, peritonsillar abscess, retropharyngeal abscess, viral URI. ? ?ED Course ?Appears well.  Notably febrile at 101.6.  We will go ahead and treat with acetaminophen. ? ?Respiratory panel negative for COVID-19 or influenza.  Strep PCR positive. ? ?Assessment/Plan ?Presentation consistent with strep pharyngitis.  Unlikely associated with peritonsillar abscess or retropharyngeal abscess given stable presentation and unremarkable physical exam.  We will go ahead and treat here with oral dexamethasone given the patient's endorsement of difficulty swallowing.  Gave the patient option between one-time dose of Bicillin versus 10-day course of amoxicillin.  Patient shows the Bicillin.  We will go ahead and treat her here with  that.  We will then plan to discharge. ? ?Patient was provided with anticipatory guidance, return precautions, and educational material. Encouraged the patient to return to the emergency department at any time if they begin to experience any new or worsening symptoms.  ? ?  ? ? ?FINAL CLINICAL IMPRESSION(S) / ED DIAGNOSES  ? ?Final diagnoses:  ?Strep throat  ? ? ? ?Rx / DC Orders  ? ?ED Discharge Orders   ? ? None  ? ?  ? ? ? ?Note:  This document was prepared using Dragon voice recognition software and may include unintentional dictation errors. ?  ?Teodoro Spray, [place] ?11/15/21 1232 ? ?  ?Rada Hay, MD ?11/15/21 1430 ? ?

## 2021-11-15 NOTE — ED Triage Notes (Signed)
Pt states that she has a sore throat more so on the right side with an ear ache- pt states it started yesterday- pt also having chills ?

## 2021-11-27 ENCOUNTER — Emergency Department
Admission: EM | Admit: 2021-11-27 | Discharge: 2021-11-28 | Disposition: A | Discharge status: Home / Self Care | Payer: Medicare Other | Attending: Emergency Medicine | Admitting: Emergency Medicine

## 2021-11-27 ENCOUNTER — Emergency Department: Payer: Medicare Other

## 2021-11-27 DIAGNOSIS — M545 Low back pain, unspecified: Secondary | ICD-10-CM | POA: Diagnosis not present

## 2021-11-27 DIAGNOSIS — R109 Unspecified abdominal pain: Secondary | ICD-10-CM

## 2021-11-27 DIAGNOSIS — D72829 Elevated white blood cell count, unspecified: Secondary | ICD-10-CM | POA: Insufficient documentation

## 2021-11-27 DIAGNOSIS — I10 Essential (primary) hypertension: Secondary | ICD-10-CM | POA: Insufficient documentation

## 2021-11-27 DIAGNOSIS — R197 Diarrhea, unspecified: Secondary | ICD-10-CM | POA: Diagnosis not present

## 2021-11-27 DIAGNOSIS — R110 Nausea: Secondary | ICD-10-CM | POA: Insufficient documentation

## 2021-11-27 DIAGNOSIS — R1032 Left lower quadrant pain: Secondary | ICD-10-CM | POA: Diagnosis present

## 2021-11-27 DIAGNOSIS — Z8616 Personal history of COVID-19: Secondary | ICD-10-CM | POA: Insufficient documentation

## 2021-11-27 LAB — URINALYSIS, ROUTINE W REFLEX MICROSCOPIC
Bacteria, UA: NONE SEEN
Bilirubin Urine: NEGATIVE
Glucose, UA: NEGATIVE mg/dL
Hgb urine dipstick: NEGATIVE
Ketones, ur: NEGATIVE mg/dL
Leukocytes,Ua: NEGATIVE
Nitrite: NEGATIVE
Protein, ur: 30 mg/dL — AB
Specific Gravity, Urine: 1.026 (ref 1.005–1.030)
pH: 7 (ref 5.0–8.0)

## 2021-11-27 LAB — BASIC METABOLIC PANEL
Anion gap: 7 (ref 5–15)
BUN: 19 mg/dL (ref 6–20)
CO2: 30 mmol/L (ref 22–32)
Calcium: 8.9 mg/dL (ref 8.9–10.3)
Chloride: 101 mmol/L (ref 98–111)
Creatinine, Ser: 0.97 mg/dL (ref 0.44–1.00)
GFR, Estimated: >60 mL/min (ref >60)
Glucose, Bld: 130 mg/dL — ABNORMAL HIGH (ref 70–99)
Potassium: 3.6 mmol/L (ref 3.5–5.1)
Sodium: 138 mmol/L (ref 135–145)

## 2021-11-27 LAB — CBC
HCT: 37.1 % (ref 36.0–46.0)
Hemoglobin: 12.5 g/dL (ref 12.0–15.0)
MCH: 28.2 pg (ref 26.0–34.0)
MCHC: 33.7 g/dL (ref 30.0–36.0)
MCV: 83.6 fL (ref 80.0–100.0)
Platelets: 235 10*3/uL (ref 150–400)
RBC: 4.44 MIL/uL (ref 3.87–5.11)
RDW: 11.9 % (ref 11.5–15.5)
WBC: 12.4 10*3/uL — ABNORMAL HIGH (ref 4.0–10.5)
nRBC: 0 % (ref 0.0–0.2)

## 2021-11-27 LAB — POC URINE PREG, ED: Preg Test, Ur: NEGATIVE

## 2021-11-27 MED ORDER — KETOROLAC TROMETHAMINE 15 MG/ML IJ SOLN
15.0000 mg | Freq: Once | INTRAMUSCULAR | Status: AC
  Administered 2021-11-28: 15 mg via INTRAMUSCULAR
  Filled 2021-11-27: qty 1

## 2021-11-27 NOTE — ED Notes (Signed)
ED Provider at bedside. 

## 2021-11-27 NOTE — ED Notes (Signed)
Patient transported to CT 

## 2021-11-27 NOTE — ED Triage Notes (Signed)
Pt presents tonight with complaints of left sided flank/abdominal pain with associated nausea starting 2 days ago intermittently. She notes that she has had increased urination along with the pain but denies burning. ?Denies CP or SOB ?

## 2021-11-28 DIAGNOSIS — R1032 Left lower quadrant pain: Secondary | ICD-10-CM | POA: Diagnosis not present

## 2021-11-28 MED ORDER — OXYCODONE-ACETAMINOPHEN 5-325 MG PO TABS
1.0000 | ORAL_TABLET | Freq: Once | ORAL | Status: AC
  Administered 2021-11-28: 1 via ORAL
  Filled 2021-11-28: qty 1

## 2021-11-28 NOTE — ED Provider Notes (Signed)
? ?[facility] ?Provider Note ? ? ? Event Date/Time  ? First MD Initiated Contact with Patient 11/27/21 2212   ?  (approximate) ? ? ?History  ? ?Flank Pain ? ? ?HPI ? ?Caitlyn [name] is a 56 y.o. female with past medical history of GERD, hypertension, arthritis and anemia presents with left-sided abdominal/flank/back pain.  Symptoms started several days ago.  Pain is located both in the low back radiating around to the left flank and lower abdomen.  Describes as feeling like contractions and sharp pain that occasionally goes down her left leg.  Denies paresthesias.  She has some associated nausea and loose stool today.  Denies urinary symptoms.  No fevers or chills.  No history of similar. ?  ? ?Past Medical History:  ?Diagnosis Date  ? Anemia   ? Anemia   ? Anxiety   ? Arthritis   ? COVID-19   ? Depression   ? Dyspepsia   ? GERD (gastroesophageal reflux disease)   ? Heart burn   ? Hypertension   ? Irregular menstrual cycle   ? ? ?Patient Active Problem List  ? Diagnosis Date Noted  ? Chronic pain syndrome 10/16/2019  ? Cervicalgia 10/16/2019  ? Chronic neck pain (1ry area of Pain) (Bilateral) (L>R) 10/16/2019  ? Chronic shoulder pain (Left) 10/16/2019  ? Chronic upper extremity pain (Bilateral) 10/16/2019  ? Chronic low back pain (Bilateral) w/o sciatica 10/16/2019  ? Chronic lower extremity pain (Bilateral) (R>L) 10/16/2019  ? 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 Pharmacologic therapy 10/16/2019  ? Disorder of skeletal system 10/16/2019  ? Problems influencing health status 10/16/2019  ? DDD (degenerative disc disease), cervical 10/16/2019  ? DDD (degenerative disc disease), lumbosacral 10/16/2019  ? Abnormal MRI, cervical spine (09/29/2019) 10/16/2019  ? Bursitis of left shoulder 08/10/2019  ? Left cervical radiculopathy 08/10/2019  ? Bilateral arm pain 01/11/2019  ? Vitamin D deficiency 05/03/2018  ? Nonrheumatic aortic valve insufficiency  02/28/2018  ? Non-rheumatic mitral regurgitation 02/28/2018  ? Non-rheumatic tricuspid valve insufficiency 02/28/2018  ? Heart palpitations 02/07/2018  ? Hypertension 02/07/2018  ? Chronic pain of wrist (Left) 10/27/2017  ? Myofascial muscle pain 10/27/2017  ? Synovitis of left knee 10/21/2017  ? Chest pain on breathing 05/27/2017  ? Dizziness 05/27/2017  ? Episodic lightheadedness 05/27/2017  ? Myalgia 05/12/2017  ? Primary localized osteoarthritis of left knee 03/16/2017  ? Surgical menopause, symptomatic 01/26/2017  ? Status post abdominal hysterectomy and left salpingo-oophorectomy 01/26/2017  ? S/P TAH (total abdominal hysterectomy) 01/20/2017  ? 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 Primary localized osteoarthritis of right knee 07/07/2016  ? Abnormal hepatitis serology 04/10/2016  ? Polyarthralgia 04/03/2016  ? Bilateral anterior knee pain 04/03/2016  ? Anemia 10/23/2015  ? ? ? ?Physical Exam  ?Triage Vital Signs: ?ED Triage Vitals  ?Enc Vitals Group  ?   BP 11/27/21 2203 127/85  ?   Pulse Rate 11/27/21 2203 85  ?   Resp 11/27/21 2203 20  ?   Temp 11/27/21 2203 99.2 ?F (37.3 ?C)  ?   Temp Source 11/27/21 2203 Oral  ?   SpO2 11/27/21 2203 97 %  ?   Weight 11/27/21 2204 190 lb (86.2 kg)  ?   Height 11/27/21 2204 '5\' 4"'$  (1.626 m)  ?   Head Circumference --   ?   Peak Flow --   ?   Pain Score 11/27/21 2207 10  ?   Pain Loc --   ?  Pain Edu? --   ?   Excl. in [place]? --   ? ? ?Most recent vital signs: ?Vitals:  ? 11/27/21 2203  ?BP: 127/85  ?Pulse: 85  ?Resp: 20  ?Temp: 99.2 ?F (37.3 ?C)  ?SpO2: 97%  ? ? ? ?General: Awake, no distress.  ?CV:  Good peripheral perfusion.  ?Resp:  Normal effort.  ?Abd:  No distention.  Minimal tenderness in the left lower quadrant and left mid abdomen, no guarding ?Neuro:             Awake, Alert, Oriented x 3  ?Other:  No CVA tenderness, tender to palpation over the left SI joint ? ? ?ED Results / Procedures / Treatments  ?Labs ?(all labs ordered are listed, but only abnormal results are  displayed) ?Labs Reviewed  ?URINALYSIS, ROUTINE W REFLEX MICROSCOPIC - Abnormal; Notable for the following components:  ?    Result Value  ? Color, Urine YELLOW (*)   ? APPearance CLEAR (*)   ? Protein, ur 30 (*)   ? All other components within normal limits  ?BASIC METABOLIC PANEL - Abnormal; Notable for the following components:  ? Glucose, Bld 130 (*)   ? All other components within normal limits  ?CBC - Abnormal; Notable for the following components:  ? WBC 12.4 (*)   ? All other components within normal limits  ?URINE CULTURE  ?POC URINE PREG, ED  ? ? ? ?EKG ? ? ? ? ?RADIOLOGY ?Reviewed CT renal study which is negative for acute pathology, does show scattered periaortic lymph nodes ? ? ?PROCEDURES: ? ?Critical Care performed: No ? ?Procedures ? ?The patient is on the cardiac monitor to evaluate for evidence of arrhythmia and/or significant heart rate changes. ? ? ?MEDICATIONS ORDERED IN ED: ?Medications  ?ketorolac (TORADOL) 15 MG/ML injection 15 mg (15 mg Intramuscular Given 11/28/21 0020)  ?oxyCODONE-acetaminophen (PERCOCET/ROXICET) 5-325 MG per tablet 1 tablet (1 tablet Oral Given 11/28/21 0020)  ? ? ? ?IMPRESSION / MDM / ASSESSMENT AND PLAN / ED COURSE  ?I reviewed the triage vital signs and the nursing notes. ?             ?               ? ?Differential diagnosis includes, but is not limited to, kidney stone, pyelonephritis, UTI, musculoskeletal, diverticulitis ? ?The patient is a 56 year old female presenting with left back/flank/abdominal pain.  She has some associated nausea and loose stool today but no fever or urinary symptoms.  On exam she has no CVA tenderness somewhat tender over the left SI joint and has minimal left lower quadrant tenderness on exam abdomen is benign.  She is describing a shooting sharp pain that occasionally goes down to the front of her thigh receiving this could be musculoskeletal.  UA obtained has no RBCs or WBCs to suggest infection.  She has a mild leukocytosis to 12 BMP  otherwise reassuring with normal renal function.  CT renal study was obtained both to evaluate for stone also to assess for other intra-abdominal pathology as source of her pain and is overall reassuring.  No stone or acute process.  Nonspecific enlarged periaortic lymph nodes.  Ultimate unclear what the source of her pain is, could be musculoskeletal.  Ultimately my suspicion for serious underlying pathology is low.  Advised NSAIDs and PCP follow-up.  ?  ? ? ?FINAL CLINICAL IMPRESSION(S) / ED DIAGNOSES  ? ?Final diagnoses:  ?Flank pain  ? ? ? ?Rx / DC Orders  ? ?  ED Discharge Orders   ? ? None  ? ?  ? ? ? ?Note:  This document was prepared using Dragon voice recognition software and may include unintentional dictation errors. ?  ?Rada Hay, MD ?11/28/21 [phone] ? ?

## 2021-11-28 NOTE — Discharge Instructions (Signed)
Your CT scan of the abdomen did not show an explanation for your pain. Your urine sample and blood work were otherwise reassuring. Please follow-up with your primary care provider.  If you develop fever worsening abdominal pain or unable to eat or drink, please return to emergency department. ?

## 2021-11-29 LAB — URINE CULTURE: Culture: NO GROWTH

## 2021-12-22 ENCOUNTER — Emergency Department: Payer: Medicare Other

## 2021-12-22 ENCOUNTER — Emergency Department
Admission: EM | Admit: 2021-12-22 | Discharge: 2021-12-22 | Disposition: A | Discharge status: Home / Self Care | Payer: Medicare Other | Attending: Emergency Medicine | Admitting: Emergency Medicine

## 2021-12-22 ENCOUNTER — Other Ambulatory Visit: Payer: Self-pay

## 2021-12-22 DIAGNOSIS — I10 Essential (primary) hypertension: Secondary | ICD-10-CM | POA: Diagnosis not present

## 2021-12-22 DIAGNOSIS — S022XXA Fracture of nasal bones, initial encounter for closed fracture: Secondary | ICD-10-CM | POA: Insufficient documentation

## 2021-12-22 DIAGNOSIS — S0992XA Unspecified injury of nose, initial encounter: Secondary | ICD-10-CM | POA: Diagnosis present

## 2021-12-22 DIAGNOSIS — W010XXA Fall on same level from slipping, tripping and stumbling without subsequent striking against object, initial encounter: Secondary | ICD-10-CM | POA: Insufficient documentation

## 2021-12-22 MED ORDER — HYDROCODONE-ACETAMINOPHEN 5-325 MG PO TABS: 1.0000 | ORAL_TABLET | Freq: Four times a day (QID) | ORAL | 0 refills | Status: DC | PRN

## 2021-12-22 NOTE — ED Triage Notes (Signed)
Pt to ED with husband, pt tripped and fell onto face and nose while she was stepping over a box on the ground. Pt has had bilateral knee surgery and states that at times legs give out on her but this time she is not sure exactly why she fell. Did not feel dizzy at the time.  ? ?Denies LOC. States nose was bleeding a lot when she first fell, not bleeding at this time. States heard a crack when fell on nose. Nose appears slightly swollen with superficial abrasion but does not appear deviated or grossly swollen at this time. ? ? ?

## 2021-12-22 NOTE — ED Provider Notes (Signed)
? ?[facility] ?Provider Note ? ? ? Event Date/Time  ? First MD Initiated Contact with Patient 12/22/21 1346   ?  (approximate) ? ? ?History  ? ?Fall and Facial Injury ? ? ?HPI ?Spanish interpreter per [employer] ?Caitlyn [name] is a 56 y.o. female presents to the ED with family member complaining of nasal pain and also left nostril bleeding after she fell today.  Patient states that she was standing near a planter and when she went to take a step fell forward.  She denies any head injury or loss of consciousness.  She also denies any dizziness, visual changes, nausea or vomiting.  Her only injury is her nose.  She denies any injury to her lower extremities.  Patient continues to ambulate without any assistance.  Patient has a history of hypertension, anxiety, arthritis, depression, status post bilateral knee replacements and degenerative disc disease cervical and lumbosacral area.  She rates her pain as a 5 out of 10. ?  ? ? ?Physical Exam  ? ?Triage Vital Signs: ?ED Triage Vitals  ?Enc Vitals Group  ?   BP 12/22/21 1309 (!) 148/86  ?   Pulse Rate 12/22/21 1309 68  ?   Resp 12/22/21 1309 18  ?   Temp 12/22/21 1309 98.2 ?F (36.8 ?C)  ?   Temp Source 12/22/21 1309 Oral  ?   SpO2 12/22/21 1309 93 %  ?   Weight 12/22/21 1310 190 lb (86.2 kg)  ?   Height 12/22/21 1310 '5\' 4"'$  (1.626 m)  ?   Head Circumference --   ?   Peak Flow --   ?   Pain Score 12/22/21 1309 5  ?   Pain Loc --   ?   Pain Edu? --   ?   Excl. in [place]? --   ? ? ?Most recent vital signs: ?Vitals:  ? 12/22/21 1309  ?BP: (!) 148/86  ?Pulse: 68  ?Resp: 18  ?Temp: 98.2 ?F (36.8 ?C)  ?SpO2: 93%  ? ? ? ?General: Awake, no distress.  Talkative, alert and answering questions appropriately. ?CV:  Good peripheral perfusion.  Heart regular rate and rhythm. ?Resp:  Normal effort.  Lungs are clear bilaterally. ?Abd:  No distention.  ?Other:  On examination of the face there is no tenderness on palpation of the forehead, maxillary area or chin.   There is no dental injury noted however there is some small ecchymosis noted to the upper inner lip without active bleeding.  Nasal bone is tender and there is soft tissue edema surrounding this.  No cervical tenderness is noted.  Palpation of the lower extremities is negative and no abrasions or skin discoloration is noted. ? ? ?ED Results / Procedures / Treatments  ? ?Labs ?(all labs ordered are listed, but only abnormal results are displayed) ?Labs Reviewed - No data to display ? ? ?RADIOLOGY ? ?Nasal bone x-ray images were reviewed by myself independently of the radiologist and is positive for a fracture.  Radiology report states there is not displaced fracture. ? ? ?PROCEDURES: ? ?Critical Care performed:  ? ?Procedures ? ? ?MEDICATIONS ORDERED IN ED: ?Medications - No data to display ? ? ?IMPRESSION / MDM / ASSESSMENT AND PLAN / ED COURSE  ?I reviewed the triage vital signs and the nursing notes. ? ? ?Differential diagnosis includes, but is not limited to, contusion face, fractured nose, dental injury secondary to fall. ? ?57 year old female presents to the ED after a fall that occurred today  when she was standing close to a planter and took an awkward step falling forward.  Patient states that the only injury she has is to her nose and there was no loss of consciousness.  Family members are present and agree.  She states that occasionally because of her bilateral knee replacements she takes awkward steps and she reports she was too close to the plantar.  There is evidence of some nasal bleeding that is resolved from the left nostril.  Soft tissue edema with some erythema is present.  No open wounds present.  On exam there is no no tenderness on palpation of the remainder of the face and patient insists that the only thing that is injured is her nose.  All history and physical exam was done while using the Spanish interpreter on [employer].  Patient was made aware that there was a nondisplaced fracture of her  nasal bone and that she can expect to be swollen for approximately 3 to 4 weeks.  She was given the name of the ENT specialist on call today to make an appointment if she has any difficulty breathing through her nose after the swelling goes down.  Patient reports that she does need something for pain and has taken codeine in the past without any difficulties.  A prescription for Norco was sent to her pharmacy and she was instructed to use ice to help with swelling.  She is return to the emergency department if any severe worsening of her symptoms. ? ? ? ?  ? ? ?FINAL CLINICAL IMPRESSION(S) / ED DIAGNOSES  ? ?Final diagnoses:  ?Closed fracture of nasal bone, initial encounter  ? ? ? ?Rx / DC Orders  ? ?ED Discharge Orders   ? ?      Ordered  ?  HYDROcodone-acetaminophen (NORCO/VICODIN) 5-325 MG tablet  Every 6 hours PRN       ? 12/22/21 1502  ? ?  ?  ? ?  ? ? ? ?Note:  This document was prepared using Dragon voice recognition software and may include unintentional dictation errors. ?  ?Johnn Hai, PA-C ?12/22/21 1511 ? ?  ?Vanessa Milford, MD ?12/26/21 1516 ? ?

## 2021-12-22 NOTE — Discharge Instructions (Addendum)
Ice to nose ?Pain medication at pharmacy ?See Dr. [name] if any problems breathing through your nose after swelling improves in 3-4 weeks ?

## 2022-02-09 ENCOUNTER — Other Ambulatory Visit: Payer: Self-pay

## 2022-02-09 ENCOUNTER — Encounter: Payer: Self-pay | Admitting: Emergency Medicine

## 2022-02-09 ENCOUNTER — Emergency Department: Payer: Medicare Other

## 2022-02-09 ENCOUNTER — Emergency Department
Admission: EM | Admit: 2022-02-09 | Discharge: 2022-02-09 | Disposition: A | Discharge status: Home / Self Care | Payer: Medicare Other | Attending: Emergency Medicine | Admitting: Emergency Medicine

## 2022-02-09 DIAGNOSIS — R1032 Left lower quadrant pain: Secondary | ICD-10-CM | POA: Insufficient documentation

## 2022-02-09 DIAGNOSIS — R109 Unspecified abdominal pain: Secondary | ICD-10-CM

## 2022-02-09 LAB — URINALYSIS, ROUTINE W REFLEX MICROSCOPIC
Bilirubin Urine: NEGATIVE
Glucose, UA: NEGATIVE mg/dL
Hgb urine dipstick: NEGATIVE
Ketones, ur: NEGATIVE mg/dL
Leukocytes,Ua: NEGATIVE
Nitrite: NEGATIVE
Protein, ur: NEGATIVE mg/dL
Specific Gravity, Urine: 1.013 (ref 1.005–1.030)
pH: 5 (ref 5.0–8.0)

## 2022-02-09 LAB — COMPREHENSIVE METABOLIC PANEL
ALT: 25 U/L (ref 0–44)
AST: 23 U/L (ref 15–41)
Albumin: 4.2 g/dL (ref 3.5–5.0)
Alkaline Phosphatase: 54 U/L (ref 38–126)
Anion gap: 9 (ref 5–15)
BUN: 16 mg/dL (ref 6–20)
CO2: 25 mmol/L (ref 22–32)
Calcium: 9.3 mg/dL (ref 8.9–10.3)
Chloride: 104 mmol/L (ref 98–111)
Creatinine, Ser: 0.76 mg/dL (ref 0.44–1.00)
GFR, Estimated: >60 mL/min (ref >60)
Glucose, Bld: 171 mg/dL — ABNORMAL HIGH (ref 70–99)
Potassium: 3.5 mmol/L (ref 3.5–5.1)
Sodium: 138 mmol/L (ref 135–145)
Total Bilirubin: 0.7 mg/dL (ref 0.3–1.2)
Total Protein: 7.5 g/dL (ref 6.5–8.1)

## 2022-02-09 LAB — CBC
HCT: 38.1 % (ref 36.0–46.0)
Hemoglobin: 12.7 g/dL (ref 12.0–15.0)
MCH: 28 pg (ref 26.0–34.0)
MCHC: 33.3 g/dL (ref 30.0–36.0)
MCV: 84.1 fL (ref 80.0–100.0)
Platelets: 258 10*3/uL (ref 150–400)
RBC: 4.53 MIL/uL (ref 3.87–5.11)
RDW: 12.2 % (ref 11.5–15.5)
WBC: 10 10*3/uL (ref 4.0–10.5)
nRBC: 0 % (ref 0.0–0.2)

## 2022-02-09 MED ORDER — KETOROLAC TROMETHAMINE 60 MG/2ML IM SOLN
60.0000 mg | Freq: Once | INTRAMUSCULAR | Status: AC
  Administered 2022-02-09: 60 mg via INTRAMUSCULAR
  Filled 2022-02-09: qty 2

## 2022-02-09 MED ORDER — METHYLPREDNISOLONE 4 MG PO TBPK: ORAL_TABLET | ORAL | 0 refills | Status: DC

## 2022-02-09 MED ORDER — ONDANSETRON 4 MG PO TBDP
4.0000 mg | ORAL_TABLET | Freq: Once | ORAL | Status: AC
Start: 2022-02-09 — End: 2022-02-09
  Administered 2022-02-09: 4 mg via ORAL
  Filled 2022-02-09: qty 1

## 2022-02-09 MED ORDER — HYDROCODONE-ACETAMINOPHEN 5-325 MG PO TABS
1.0000 | ORAL_TABLET | Freq: Four times a day (QID) | ORAL | 0 refills | Status: DC | PRN
Start: 2022-02-09 — End: 2022-05-19

## 2022-02-09 MED ORDER — HYDROCODONE-ACETAMINOPHEN 5-325 MG PO TABS
2.0000 | ORAL_TABLET | Freq: Once | ORAL | Status: AC
  Administered 2022-02-09: 2 via ORAL
  Filled 2022-02-09: qty 2

## 2022-02-09 MED ORDER — CYCLOBENZAPRINE HCL 10 MG PO TABS: 10.0000 mg | ORAL_TABLET | Freq: Three times a day (TID) | ORAL | 0 refills | Status: DC | PRN

## 2022-02-09 NOTE — ED Triage Notes (Signed)
Pt in with co left flank pain states for 2 months pt also yesterday she noted some blood on toilet paper after she voided. NO hx of kidney stones.

## 2022-02-09 NOTE — ED Notes (Signed)
See triage note  presents with lower back/flank pain  states pain started about 2 months ago .  denies any injury .ambulates well to treatment room .states she noticed some blood on paper after voiding

## 2022-02-09 NOTE — ED Provider Notes (Signed)
[facility] Provider Note    Event Date/Time   First MD Initiated Contact with Patient 02/09/22 [phone]     (approximate)   History   Flank Pain   HPI  Caitlyn [name] is a 56 y.o. female with past medical history of chronic pain, degenerative disc disease, polyarthralgias, here with left-sided flank pain.  Patient states that off-and-on for the last 2 months, she has had fairly constant, left-sided, flank pain.  Radiates down towards her left groin.  Pain is aching and throbbing, worse with some position changes.  She states that it is worsened over the last 2 days and is now severe.  Denies any specific trauma.  She does note that she had some possible blood streaks in her urine intermittently for the last 2 days, that this is not persistent.  No overt dysuria.  No fevers or chills.  No history of kidney stones.  She does have a history of cervical radiculopathy, no known history of lumbar radiculopathy.  No lower extremity weakness or numbness.  Pain does not radiate down the leg.     Physical Exam   Triage Vital Signs: ED Triage Vitals [02/09/22 0858]  Enc Vitals Group     BP 126/74     Pulse Rate 80     Resp 18     Temp 98.6 F (37 C)     Temp Source Oral     SpO2 98 %     Weight 190 lb (86.2 kg)     Height '5\' 4"'$  (1.626 m)     Head Circumference      Peak Flow      Pain Score 3     Pain Loc      Pain Edu?      Excl. in [place]?     Most recent vital signs: Vitals:   02/09/22 0858 02/09/22 1206  BP: 126/74 128/70  Pulse: 80 78  Resp: 18 18  Temp: 98.6 F (37 C)   SpO2: 98% 98%     General: Awake, no distress.  CV:  Good peripheral perfusion.  Regular rate and rhythm.  No murmurs. Resp:  Normal effort.  Lungs clear. Abd:  No distention.  Minimal diffuse left-sided tenderness.  No rebound or guarding.  No CVA tenderness. Other:  Moist mucous membranes.  No skin rash or lesions noted on the abdomen.  ED Results / Procedures /  Treatments   Labs (all labs ordered are listed, but only abnormal results are displayed) Labs Reviewed  COMPREHENSIVE METABOLIC PANEL - Abnormal; Notable for the following components:      Result Value   Glucose, Bld 171 (*)    All other components within normal limits  URINALYSIS, ROUTINE W REFLEX MICROSCOPIC - Abnormal; Notable for the following components:   Color, Urine YELLOW (*)    APPearance CLEAR (*)    All other components within normal limits  CBC     EKG    RADIOLOGY CT stone: No evidence of urolithiasis, hydronephrosis,   I also independently reviewed and agree with radiologist interpretations.   PROCEDURES:  Critical Care performed: No    MEDICATIONS ORDERED IN ED: Medications  ketorolac (TORADOL) injection 60 mg (60 mg Intramuscular Given 02/09/22 1001)  HYDROcodone-acetaminophen (NORCO/VICODIN) 5-325 MG per tablet 2 tablet (2 tablets Oral Given 02/09/22 1000)  ondansetron (ZOFRAN-ODT) disintegrating tablet 4 mg (4 mg Oral Given 02/09/22 1000)     IMPRESSION / MDM / ASSESSMENT AND PLAN / ED COURSE  I reviewed the triage vital signs and the nursing notes.                               The patient is on the cardiac monitor to evaluate for evidence of arrhythmia and/or significant heart rate changes.   Ddx:  Differential includes the following, with pertinent life- or limb-threatening emergencies considered:  Nephrolithiasis, pyelonephritis, diverticulitis, colitis, obstruction, early zoster without rash, thoracic or lumbar radiculopathy  Patient's presentation is most consistent with acute presentation with potential threat to life or bodily function.  MDM:  56 year old female here with left sided flank pain.  Clinically, suspect possible musculoskeletal pain versus nephrolithiasis.  Patient also has a history of chronic pain which could be contributing.  She is status post hysterectomy.  Lab work overall is very reassuring.  No leukocytosis or  anemia.  CMP with normal renal function and LFTs.  Urinalysis negative for UTI or stone.  CT scan also obtained, shows no evidence of acute abnormality.  Specifically, no evidence of stone.  No evidence of diverticulitis or colitis.  Patient feeling better after analgesia.  She does have a somewhat positional component to her pain in a radicular distribution, will treat for possible musculoskeletal pain versus radicular pain with brief course of anti-inflammatories and steroids.  Return precautions given.  Patient updated and is in agreement with this plan.   MEDICATIONS GIVEN IN ED: Medications  ketorolac (TORADOL) injection 60 mg (60 mg Intramuscular Given 02/09/22 1001)  HYDROcodone-acetaminophen (NORCO/VICODIN) 5-325 MG per tablet 2 tablet (2 tablets Oral Given 02/09/22 1000)  ondansetron (ZOFRAN-ODT) disintegrating tablet 4 mg (4 mg Oral Given 02/09/22 1000)     Consults:  None   EMR reviewed  Reviewed prior cardiology notes with Dr. Saralyn Pilar, ED visits at [facility] for abdominal pain     FINAL CLINICAL IMPRESSION(S) / ED DIAGNOSES   Final diagnoses:  Flank pain     Rx / DC Orders   ED Discharge Orders          Ordered    HYDROcodone-acetaminophen (NORCO/VICODIN) 5-325 MG tablet  Every 6 hours PRN        02/09/22 1217    cyclobenzaprine (FLEXERIL) 10 MG tablet  3 times daily PRN        02/09/22 1217    methylPREDNISolone (MEDROL DOSEPAK) 4 MG TBPK tablet        02/09/22 1217             Note:  This document was prepared using Dragon voice recognition software and may include unintentional dictation errors.   Duffy Bruce, MD 02/09/22 1315

## 2022-04-03 ENCOUNTER — Encounter: Payer: Medicare Other | Admitting: Obstetrics and Gynecology

## 2022-04-23 ENCOUNTER — Ambulatory Visit: Payer: Medicare Other | Attending: Neurology

## 2022-04-23 DIAGNOSIS — Z6832 Body mass index (BMI) 32.0-32.9, adult: Secondary | ICD-10-CM | POA: Diagnosis not present

## 2022-04-23 DIAGNOSIS — I10 Essential (primary) hypertension: Secondary | ICD-10-CM | POA: Insufficient documentation

## 2022-04-23 DIAGNOSIS — G4733 Obstructive sleep apnea (adult) (pediatric): Secondary | ICD-10-CM | POA: Diagnosis not present

## 2022-04-23 DIAGNOSIS — R0683 Snoring: Secondary | ICD-10-CM | POA: Diagnosis not present

## 2022-04-23 DIAGNOSIS — E669 Obesity, unspecified: Secondary | ICD-10-CM | POA: Diagnosis not present

## 2022-04-23 DIAGNOSIS — F5101 Primary insomnia: Secondary | ICD-10-CM | POA: Insufficient documentation

## 2022-04-23 DIAGNOSIS — F32A Depression, unspecified: Secondary | ICD-10-CM | POA: Diagnosis not present

## 2022-04-28 ENCOUNTER — Other Ambulatory Visit: Payer: Self-pay | Admitting: Obstetrics and Gynecology

## 2022-04-28 ENCOUNTER — Other Ambulatory Visit: Payer: Self-pay | Admitting: Physician Assistant

## 2022-05-19 ENCOUNTER — Encounter: Payer: Self-pay | Admitting: Obstetrics and Gynecology

## 2022-05-19 ENCOUNTER — Ambulatory Visit (INDEPENDENT_AMBULATORY_CARE_PROVIDER_SITE_OTHER): Payer: Medicare Other | Admitting: Obstetrics and Gynecology

## 2022-05-19 VITALS — BP 104/68 | HR 65 | Ht 64.0 in | Wt 193.3 lb

## 2022-05-19 DIAGNOSIS — N393 Stress incontinence (female) (male): Secondary | ICD-10-CM

## 2022-05-19 DIAGNOSIS — Z1231 Encounter for screening mammogram for malignant neoplasm of breast: Secondary | ICD-10-CM

## 2022-05-19 DIAGNOSIS — Z01419 Encounter for gynecological examination (general) (routine) without abnormal findings: Secondary | ICD-10-CM | POA: Diagnosis not present

## 2022-05-19 LAB — POCT URINALYSIS DIPSTICK
Bilirubin, UA: NEGATIVE
Blood, UA: NEGATIVE
Glucose, UA: NEGATIVE
Ketones, UA: NEGATIVE
Leukocytes, UA: NEGATIVE
Nitrite, UA: NEGATIVE
Protein, UA: NEGATIVE
Spec Grav, UA: 1.01 (ref 1.010–1.025)
Urobilinogen, UA: 0.2 E.U./dL
pH, UA: 6 (ref 5.0–8.0)

## 2022-05-19 NOTE — Progress Notes (Signed)
HPI:      [name] is a 56 y.o. [phone] who LMP was Patient's last menstrual period was 01/07/2017 (exact date).  Subjective:   She presents today initially for an annual exam but decided that she would rather talk about her urine loss.  She reports that her urine loss has become worse over the last 6 months to the point where she leaks urine almost all day long off and on.  She also has some incontinence at night and in the morning when she first wakes up.  She says it is mostly with coughing laughing sneezing or straining. She has previously had a hysterectomy for heavy bleeding. She also reports that she regularly feels irritated "down there". She reports no problems with her bowel movements and reports nothing "bulging from the vagina".    Hx: The following portions of the patient's history were reviewed and updated as appropriate:             She  has a past medical history of Anemia, Anemia, Anxiety, Arthritis, COVID-19, Depression, Dyspepsia, GERD (gastroesophageal reflux disease), Heart burn, Hypertension, and Irregular menstrual cycle. She does not have any pertinent problems on file. She  has a past surgical history that includes Cholecystectomy, laparoscopic; Laparoscopic ovarian cystectomy (Right); Tubal ligation; Meniscectomy (Right); Total knee arthroplasty (Right, 07/07/2016); Cholecystectomy; Joint replacement; Knee arthroscopy (Left, 12/03/2016); Abdominal hysterectomy (Left, 01/18/2017); Total knee arthroplasty (Left, 03/16/2017); Knee Closed Reduction (Left, 04/15/2017); Knee arthrotomy (Left, 10/21/2017); Esophagogastroduodenoscopy (N/A, 11/04/2017); Colonoscopy with propofol (N/A, 11/04/2017); Esophagogastroduodenoscopy; and Esophagogastroduodenoscopy (egd) with propofol (N/A, 05/30/2019). Her family history includes Diabetes in her father; Heart failure in her father; Hypertension in her father; Osteoarthritis in her mother and sister. She  reports that she quit smoking  about 7 years ago. Her smoking use included cigarettes. She smoked an average of .5 packs per day. She has never used smokeless tobacco. She reports current alcohol use. She reports that she does not use drugs. She has a current medication list which includes the following prescription(s): albuterol, cyclobenzaprine, diclofenac, gabapentin, gentamicin, lisinopril-hydrochlorothiazide, metoprolol succinate, naphazoline-pheniramine, pantoprazole, prednisone, sertraline, tizanidine, tramadol, trazodone, triamcinolone ointment, zolpidem, and vitamin d (ergocalciferol), and the following Facility-Administered Medications: triamcinolone cream. She has No Known Allergies.       Review of Systems:  Review of Systems  Constitutional: Denied constitutional symptoms, night sweats, recent illness, fatigue, fever, insomnia and weight loss.  Eyes: Denied eye symptoms, eye pain, photophobia, vision change and visual disturbance.  Ears/Nose/Throat/Neck: Denied ear, nose, throat or neck symptoms, hearing loss, nasal discharge, sinus congestion and sore throat.  Cardiovascular: Denied cardiovascular symptoms, arrhythmia, chest pain/pressure, edema, exercise intolerance, orthopnea and palpitations.  Respiratory: Denied pulmonary symptoms, asthma, pleuritic pain, productive sputum, cough, dyspnea and wheezing.  Gastrointestinal: Denied, gastro-esophageal reflux, melena, nausea and vomiting.  Genitourinary: See HPI for additional information.  Musculoskeletal: Denied musculoskeletal symptoms, stiffness, swelling, muscle weakness and myalgia.  Dermatologic: Denied dermatology symptoms, rash and scar.  Neurologic: Denied neurology symptoms, dizziness, headache, neck pain and syncope.  Psychiatric: Denied psychiatric symptoms, anxiety and depression.  Endocrine: Denied endocrine symptoms including hot flashes and night sweats.   Meds:   Current Outpatient Medications on File Prior to Visit  Medication Sig Dispense  Refill   albuterol (VENTOLIN HFA) 108 (90 Base) MCG/ACT inhaler Inhale 2 puffs into the lungs every 4 (four) hours as needed for wheezing or shortness of breath. 1 each 0   cyclobenzaprine (FLEXERIL) 10 MG tablet Take 1 tablet (10 mg total) by mouth 3 (  three) times daily as needed for muscle spasms. 20 tablet 0   DICLOFENAC PO Take by mouth.     gabapentin (NEURONTIN) 100 MG capsule Take 100 mg by mouth 2 (two) times daily.     gentamicin (GARAMYCIN) 0.3 % ophthalmic solution Place 1 drop into both eyes every 4 (four) hours. 5 mL 0   lisinopril-hydrochlorothiazide (ZESTORETIC) 20-25 MG tablet Take 1 tablet by mouth daily.     metoprolol succinate (TOPROL-XL) 25 MG 24 hr tablet Take 25 mg by mouth daily.     naphazoline-pheniramine (NAPHCON-A) 0.025-0.3 % ophthalmic solution Place 1 drop into both eyes 4 (four) times daily as needed for eye irritation. 15 mL 0   pantoprazole (PROTONIX) 40 MG tablet Take 40 mg by mouth 2 (two) times daily as needed.      predniSONE (DELTASONE) 10 MG tablet Take 3 tablets (30 mg total) by mouth daily with breakfast. 9 tablet 0   sertraline (ZOLOFT) 50 MG tablet Take 50 mg by mouth daily.      tiZANidine (ZANAFLEX) 2 MG tablet Take 2 mg by mouth 3 times/day as needed-between meals & bedtime.      traMADol (ULTRAM) 50 MG tablet Take 1 tablet (50 mg total) by mouth as needed. (Patient taking differently: Take 50 mg by mouth 2 (two) times daily as needed for moderate pain.) 40 tablet 1   traZODone (DESYREL) 50 MG tablet Take 50 mg by mouth at bedtime.     triamcinolone ointment (KENALOG) 0.5 % APPLY  OINTMENT TOPICALLY TO AFFECTED AREA AT BEDTIME 30 g 0   zolpidem (AMBIEN) 10 MG tablet Take 10 mg by mouth at bedtime.     Vitamin D, Ergocalciferol, (DRISDOL) 50000 units CAPS capsule Take 50,000 Units by mouth every 7 (seven) days. (Patient not taking: Reported on 05/19/2022)     Current Facility-Administered Medications on File Prior to Visit  Medication Dose Route  Frequency Provider Last Rate Last Admin   triamcinolone cream (KENALOG) 0.1 %   Topical QHS Harlin Heys, MD          Objective:     Vitals:   05/19/22 1448  BP: 104/68  Pulse: 65   Filed Weights   05/19/22 1448  Weight: 193 lb 4.8 oz (87.7 kg)                        Assessment:    [id number] Patient Active Problem List   Diagnosis Date Noted   Chronic pain syndrome 10/16/2019   Cervicalgia 10/16/2019   Chronic neck pain (1ry area of Pain) (Bilateral) (L>R) 10/16/2019   Chronic shoulder pain (Left) 10/16/2019   Chronic upper extremity pain (Bilateral) 10/16/2019   Chronic low back pain (Bilateral) w/o sciatica 10/16/2019   Chronic lower extremity pain (Bilateral) (R>L) 10/16/2019   Chronic knee pain after total replacement (Bilateral) (L>R) 10/16/2019   Pharmacologic therapy 10/16/2019   Disorder of skeletal system 10/16/2019   Problems influencing health status 10/16/2019   DDD (degenerative disc disease), cervical 10/16/2019   DDD (degenerative disc disease), lumbosacral 10/16/2019   Abnormal MRI, cervical spine (09/29/2019) 10/16/2019   Bursitis of left shoulder 08/10/2019   Left cervical radiculopathy 08/10/2019   Bilateral arm pain 01/11/2019   Vitamin D deficiency 05/03/2018   Nonrheumatic aortic valve insufficiency 02/28/2018   Non-rheumatic mitral regurgitation 02/28/2018   Non-rheumatic tricuspid valve insufficiency 02/28/2018   Heart palpitations 02/07/2018   Hypertension 02/07/2018   Chronic pain of wrist (Left) 10/27/2017  Myofascial muscle pain 10/27/2017   Synovitis of left knee 10/21/2017   Chest pain on breathing 05/27/2017   Dizziness 05/27/2017   Episodic lightheadedness 05/27/2017   Myalgia 05/12/2017   Primary localized osteoarthritis of left knee 03/16/2017   Surgical menopause, symptomatic 01/26/2017   Status post abdominal hysterectomy and left salpingo-oophorectomy 01/26/2017   S/P TAH (total abdominal hysterectomy) 01/20/2017    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 Well woman exam with routine gynecological exam   2. Stress incontinence        Plan:            1.  We discussed multiple options with the most likely option being surgery with TOT.  I have described the surgery in detail to her.  Use of a urethral sling discussed in detail as well as the possibility of a Foley catheter immediately after surgery.  I have asked her to decide regarding future intercourse as she is currently not sexually active-this will decide how aggressive we are on the repairs if she decides upon surgery. She is currently leaning toward surgery and will consider the matter but seems likely to schedule a preop appointment in the near future for pelvic relaxation and stress urinary incontinence. Orders Orders Placed This Encounter  Procedures   Basic metabolic panel   CBC   TSH   Lipid panel   Hemoglobin A1c    No orders of the defined types were placed in this encounter.     F/U  Return in about 3 weeks (around 06/09/2022). I spent 31 minutes involved in the care of this patient preparing to see the patient by obtaining and reviewing her medical history (including labs, imaging tests and prior procedures), documenting clinical information in the electronic health record (EHR), counseling and coordinating care plans, writing and sending prescriptions, ordering tests or procedures and in direct communicating with the patient and medical staff discussing pertinent items from her history and physical exam.  Finis Bud, M.D. 05/19/2022 3:48 PM

## 2022-05-19 NOTE — Progress Notes (Signed)
Patients presents for annual exam today. She states over the last year her incontinence issues have gotten worse, she states mainly stress incontinence symptoms. Patient states not currently using any creams for irritation, complains of itching. History of hysterectomy. Patient is due for mammogram, scheduled for 06/02/22. Annual labs are ordered, will return when fasting.  Patient states no other questions or concerns at this time.

## 2022-07-01 ENCOUNTER — Other Ambulatory Visit: Payer: Self-pay | Admitting: Family Medicine

## 2022-07-01 DIAGNOSIS — N63 Unspecified lump in unspecified breast: Secondary | ICD-10-CM

## 2022-07-03 ENCOUNTER — Inpatient Hospital Stay
Admission: RE | Admit: 2022-07-03 | Discharge: 2022-07-03 | Disposition: A | Discharge status: Home / Self Care | Payer: Self-pay | Source: Ambulatory Visit | Attending: *Deleted | Admitting: *Deleted

## 2022-07-03 ENCOUNTER — Other Ambulatory Visit: Payer: Self-pay | Admitting: *Deleted

## 2022-07-03 DIAGNOSIS — Z1231 Encounter for screening mammogram for malignant neoplasm of breast: Secondary | ICD-10-CM

## 2022-07-22 ENCOUNTER — Ambulatory Visit: Admission: RE | Admit: 2022-07-22 | Payer: Medicare Other | Source: Ambulatory Visit

## 2022-07-22 ENCOUNTER — Ambulatory Visit: Payer: Medicare Other

## 2022-08-14 ENCOUNTER — Other Ambulatory Visit: Payer: Self-pay | Admitting: Family Medicine

## 2022-08-14 ENCOUNTER — Ambulatory Visit
Admission: RE | Admit: 2022-08-14 | Discharge: 2022-08-14 | Disposition: A | Discharge status: Home / Self Care | Payer: Medicare Other | Source: Ambulatory Visit | Attending: Family Medicine | Admitting: Family Medicine

## 2022-08-14 DIAGNOSIS — N63 Unspecified lump in unspecified breast: Secondary | ICD-10-CM

## 2022-08-14 DIAGNOSIS — Z1231 Encounter for screening mammogram for malignant neoplasm of breast: Secondary | ICD-10-CM | POA: Diagnosis not present

## 2022-08-14 DIAGNOSIS — N6082 Other benign mammary dysplasias of left breast: Secondary | ICD-10-CM | POA: Insufficient documentation

## 2022-10-16 ENCOUNTER — Emergency Department: Payer: 59

## 2022-10-16 ENCOUNTER — Emergency Department
Admission: EM | Admit: 2022-10-16 | Discharge: 2022-10-16 | Disposition: A | Discharge status: Home / Self Care | Payer: 59 | Attending: Emergency Medicine | Admitting: Emergency Medicine

## 2022-10-16 ENCOUNTER — Other Ambulatory Visit: Payer: Self-pay

## 2022-10-16 DIAGNOSIS — J069 Acute upper respiratory infection, unspecified: Secondary | ICD-10-CM | POA: Insufficient documentation

## 2022-10-16 DIAGNOSIS — R059 Cough, unspecified: Secondary | ICD-10-CM | POA: Diagnosis present

## 2022-10-16 DIAGNOSIS — Z20822 Contact with and (suspected) exposure to covid-19: Secondary | ICD-10-CM | POA: Diagnosis not present

## 2022-10-16 LAB — RESP PANEL BY RT-PCR (RSV, FLU A&B, COVID)  RVPGX2
Influenza A by PCR: NEGATIVE
Influenza B by PCR: NEGATIVE
Resp Syncytial Virus by PCR: NEGATIVE
SARS Coronavirus 2 by RT PCR: NEGATIVE

## 2022-10-16 LAB — GROUP A STREP BY PCR: Group A Strep by PCR: NOT DETECTED

## 2022-10-16 MED ORDER — KETOROLAC TROMETHAMINE 30 MG/ML IJ SOLN
30.0000 mg | Freq: Once | INTRAMUSCULAR | Status: AC
  Administered 2022-10-16: 30 mg via INTRAMUSCULAR
  Filled 2022-10-16: qty 1

## 2022-10-16 MED ORDER — LIDOCAINE VISCOUS HCL 2 % MT SOLN
15.0000 mL | Freq: Once | OROMUCOSAL | Status: AC
  Administered 2022-10-16: 15 mL via OROMUCOSAL
  Filled 2022-10-16: qty 15

## 2022-10-16 MED ORDER — BENZONATATE 100 MG PO CAPS: 100.0000 mg | ORAL_CAPSULE | Freq: Three times a day (TID) | ORAL | 0 refills | Status: AC | PRN

## 2022-10-16 NOTE — ED Provider Notes (Signed)
[facility] Provider Note  Patient Contact: 8:38 PM (approximate)   History   Sore Throat and Otalgia (bilateral)   HPI  Caitlyn [name] is a 57 y.o. female presents to the emergency department with viral URI-like symptoms for the past 2 to 3 days.  Patient has cough, nasal congestion, pharyngitis and low back pain.  No vomiting or diarrhea.  She reports that her grandson has recently been sick with similar symptoms.  No chest pain, chest tightness or shortness of breath.      Physical Exam   Triage Vital Signs: ED Triage Vitals  Enc Vitals Group     BP 10/16/22 1929 (!) 166/91     Pulse Rate 10/16/22 1929 85     Resp 10/16/22 1929 16     Temp 10/16/22 1929 98.6 F (37 C)     Temp Source 10/16/22 1929 Oral     SpO2 10/16/22 1929 98 %     Weight 10/16/22 1930 194 lb 0.1 oz (88 kg)     Height 10/16/22 1930 5' 4"$  (1.626 m)     Head Circumference --      Peak Flow --      Pain Score 10/16/22 1930 10     Pain Loc --      Pain Edu? --      Excl. in [place]? --     Most recent vital signs: Vitals:   10/16/22 1929  BP: (!) 166/91  Pulse: 85  Resp: 16  Temp: 98.6 F (37 C)  SpO2: 98%     Constitutional: Alert and oriented. Patient is lying supine. Eyes: Conjunctivae are normal. PERRL. EOMI. Head: Atraumatic. ENT:      Ears: Tympanic membranes are mildly injected with mild effusion bilaterally.       Nose: No congestion/rhinnorhea.      Mouth/Throat: Mucous membranes are moist. Posterior pharynx is mildly erythematous.  Hematological/Lymphatic/Immunilogical: No cervical lymphadenopathy.  Cardiovascular: Normal rate, regular rhythm. Normal S1 and S2.  Good peripheral circulation. Respiratory: Normal respiratory effort without tachypnea or retractions. Lungs CTAB. Good air entry to the bases with no decreased or absent breath sounds. Gastrointestinal: Bowel sounds 4 quadrants. Soft and nontender to palpation. No guarding or rigidity. No  palpable masses. No distention. No CVA tenderness. Musculoskeletal: Full range of motion to all extremities. No gross deformities appreciated. Neurologic:  Normal speech and language. No gross focal neurologic deficits are appreciated.  Skin:  Skin is warm, dry and intact. No rash noted. Psychiatric: Mood and affect are normal. Speech and behavior are normal. Patient exhibits appropriate insight and judgement.   ED Results / Procedures / Treatments   Labs (all labs ordered are listed, but only abnormal results are displayed) Labs Reviewed  RESP PANEL BY RT-PCR (RSV, FLU A&B, COVID)  RVPGX2  GROUP A STREP BY PCR        RADIOLOGY  I personally viewed and evaluated these images as part of my medical decision making, as well as reviewing the written report by the radiologist.  ED Provider Interpretation: Chest x-ray shows no acute abnormality.   PROCEDURES:  Critical Care performed: No  Procedures   MEDICATIONS ORDERED IN ED: Medications  lidocaine (XYLOCAINE) 2 % viscous mouth solution 15 mL (has no administration in time range)  ketorolac (TORADOL) 30 MG/ML injection 30 mg (has no administration in time range)     IMPRESSION / MDM / ASSESSMENT AND PLAN / ED COURSE  I reviewed the triage vital signs and  the nursing notes.                              Assessment and plan Viral URI 57 year old female presents to the emergency department with viral URI-like symptoms.  Patient was hypertensive at triage but vital signs were otherwise reassuring.  On exam, patient alert, active and nontoxic-appearing.  She tested negative for group A strep, COVID-19, influenza and RSV.  Supportive measures were encouraged at home and I will prescribe patient Tessalon Perles and viscous lidocaine.  Patient was given injection of Toradol prior to discharge.     FINAL CLINICAL IMPRESSION(S) / ED DIAGNOSES   Final diagnoses:  Viral upper respiratory tract infection     Rx / DC Orders    ED Discharge Orders          Ordered    benzonatate (TESSALON PERLES) 100 MG capsule  3 times daily PRN        10/16/22 2037             Note:  This document was prepared using Dragon voice recognition software and may include unintentional dictation errors.   Vallarie Mare [place], Hershal Coria 10/16/22 2041    Harvest Dark, MD 10/17/22 2243

## 2022-10-16 NOTE — ED Triage Notes (Signed)
Pt to ED from home for sore throat, bilateral ear pain and congestion in her nose. Pt is CAOx4 and in no acute distress/ Started yesterday. Ambulatory in triage.

## 2022-10-16 NOTE — Discharge Instructions (Addendum)
You can take 2 Tessalon Perles at night before bed for cough. You can gargle with viscous lidocaine to help with pharyngitis. Please alternate Tylenol and ibuprofen at home. He tested negative for COVID-19, influenza, RSV and group A strep.

## 2022-10-18 ENCOUNTER — Emergency Department
Admission: EM | Admit: 2022-10-18 | Discharge: 2022-10-18 | Disposition: A | Discharge status: Home / Self Care | Payer: 59 | Attending: Emergency Medicine | Admitting: Emergency Medicine

## 2022-10-18 ENCOUNTER — Emergency Department: Payer: 59

## 2022-10-18 ENCOUNTER — Encounter: Payer: Self-pay | Admitting: Emergency Medicine

## 2022-10-18 DIAGNOSIS — Z20822 Contact with and (suspected) exposure to covid-19: Secondary | ICD-10-CM | POA: Insufficient documentation

## 2022-10-18 DIAGNOSIS — I10 Essential (primary) hypertension: Secondary | ICD-10-CM | POA: Insufficient documentation

## 2022-10-18 DIAGNOSIS — J039 Acute tonsillitis, unspecified: Secondary | ICD-10-CM | POA: Diagnosis not present

## 2022-10-18 DIAGNOSIS — J029 Acute pharyngitis, unspecified: Secondary | ICD-10-CM | POA: Diagnosis present

## 2022-10-18 LAB — BASIC METABOLIC PANEL
Anion gap: 10 (ref 5–15)
BUN: 14 mg/dL (ref 6–20)
CO2: 26 mmol/L (ref 22–32)
Calcium: 9 mg/dL (ref 8.9–10.3)
Chloride: 102 mmol/L (ref 98–111)
Creatinine, Ser: 0.72 mg/dL (ref 0.44–1.00)
GFR, Estimated: >60 mL/min (ref >60)
Glucose, Bld: 92 mg/dL (ref 70–99)
Potassium: 3.5 mmol/L (ref 3.5–5.1)
Sodium: 138 mmol/L (ref 135–145)

## 2022-10-18 LAB — CBC WITH DIFFERENTIAL/PLATELET
Abs Immature Granulocytes: 0.04 10*3/uL (ref 0.00–0.07)
Basophils Absolute: 0.1 10*3/uL (ref 0.0–0.1)
Basophils Relative: 1 %
Eosinophils Absolute: 0.8 10*3/uL — ABNORMAL HIGH (ref 0.0–0.5)
Eosinophils Relative: 7 %
HCT: 41.5 % (ref 36.0–46.0)
Hemoglobin: 13.7 g/dL (ref 12.0–15.0)
Immature Granulocytes: 0 %
Lymphocytes Relative: 22 %
Lymphs Abs: 2.6 10*3/uL (ref 0.7–4.0)
MCH: 27.3 pg (ref 26.0–34.0)
MCHC: 33 g/dL (ref 30.0–36.0)
MCV: 82.7 fL (ref 80.0–100.0)
Monocytes Absolute: 0.7 10*3/uL (ref 0.1–1.0)
Monocytes Relative: 6 %
Neutro Abs: 7.6 10*3/uL (ref 1.7–7.7)
Neutrophils Relative %: 64 %
Platelets: 252 10*3/uL (ref 150–400)
RBC: 5.02 MIL/uL (ref 3.87–5.11)
RDW: 12.2 % (ref 11.5–15.5)
WBC: 11.9 10*3/uL — ABNORMAL HIGH (ref 4.0–10.5)
nRBC: 0 % (ref 0.0–0.2)

## 2022-10-18 LAB — RESP PANEL BY RT-PCR (RSV, FLU A&B, COVID)  RVPGX2
Influenza A by PCR: NEGATIVE
Influenza B by PCR: NEGATIVE
Resp Syncytial Virus by PCR: NEGATIVE
SARS Coronavirus 2 by RT PCR: NEGATIVE

## 2022-10-18 LAB — GROUP A STREP BY PCR: Group A Strep by PCR: NOT DETECTED

## 2022-10-18 MED ORDER — KETOROLAC TROMETHAMINE 15 MG/ML IJ SOLN: 15.0000 mg | Freq: Once | INTRAMUSCULAR | Status: DC

## 2022-10-18 MED ORDER — SODIUM CHLORIDE 0.9 % IV BOLUS: 1000.0000 mL | Freq: Once | INTRAVENOUS | Status: DC

## 2022-10-18 MED ORDER — AMOXICILLIN-POT CLAVULANATE 875-125 MG PO TABS: 1.0000 | ORAL_TABLET | Freq: Two times a day (BID) | ORAL | 0 refills | Status: AC

## 2022-10-18 MED ORDER — IOHEXOL 300 MG/ML  SOLN
75.0000 mL | Freq: Once | INTRAMUSCULAR | Status: AC | PRN
  Administered 2022-10-18: 75 mL via INTRAVENOUS

## 2022-10-18 NOTE — ED Triage Notes (Signed)
Using interpreter, pt reports sore throat and unable to keep anything down. States she was recently seen here and given medication for cough. Reports her gums and tongue hurt. Denies vomiting or diarrhea.

## 2022-10-18 NOTE — Discharge Instructions (Signed)
You may take the antibiotics as prescribed.  Please return for any new, worsening, or change in symptoms or other concerns.  It was a pleasure caring for you today.

## 2022-10-18 NOTE — ED Provider Notes (Signed)
[facility] Provider Note    Event Date/Time   First MD Initiated Contact with Patient 10/18/22 1150     (approximate)   History   Sore Throat and Cough   HPI  Caitlyn [name] is a 57 y.o. female with a past medical history of chronic pain syndrome, polyarthralgia, hypertension who presents today for evaluation of sore throat.  She reports that her symptoms began on Thursday and she was subsequently seen in the emergency department 2 days ago.  She had COVID/flu/RSV and strep testing which was negative and she was discharged home.  Patient reports that she feels that her symptoms are worsening.  She reports that she has been unable to eat because of pain when she swallows.  She denies any vomiting or diarrhea.  No fevers or chills.  She reports that her husband is sick with similar symptoms.  No chest pain, shortness of breath, or abdominal pain.  Patient Active Problem List   Diagnosis Date Noted   Chronic pain syndrome 10/16/2019   Cervicalgia 10/16/2019   Chronic neck pain (1ry area of Pain) (Bilateral) (L>R) 10/16/2019   Chronic shoulder pain (Left) 10/16/2019   Chronic upper extremity pain (Bilateral) 10/16/2019   Chronic low back pain (Bilateral) w/o sciatica 10/16/2019   Chronic lower extremity pain (Bilateral) (R>L) 10/16/2019   Chronic knee pain after total replacement (Bilateral) (L>R) 10/16/2019   Pharmacologic therapy 10/16/2019   Disorder of skeletal system 10/16/2019   Problems influencing health status 10/16/2019   DDD (degenerative disc disease), cervical 10/16/2019   DDD (degenerative disc disease), lumbosacral 10/16/2019   Abnormal MRI, cervical spine (09/29/2019) 10/16/2019   Bursitis of left shoulder 08/10/2019   Left cervical radiculopathy 08/10/2019   Bilateral arm pain 01/11/2019   Vitamin D deficiency 05/03/2018   Nonrheumatic aortic valve insufficiency 02/28/2018   Non-rheumatic mitral regurgitation 02/28/2018    Non-rheumatic tricuspid valve insufficiency 02/28/2018   Heart palpitations 02/07/2018   Hypertension 02/07/2018   Chronic pain of wrist (Left) 10/27/2017   Myofascial muscle pain 10/27/2017   Synovitis of left knee 10/21/2017   Chest pain on breathing 05/27/2017   Dizziness 05/27/2017   Episodic lightheadedness 05/27/2017   Myalgia 05/12/2017   Primary localized osteoarthritis of left knee 03/16/2017   Surgical menopause, symptomatic 01/26/2017   Status post abdominal hysterectomy and left salpingo-oophorectomy 01/26/2017   S/P TAH (total abdominal hysterectomy) 01/20/2017   Leiomyoma uteri 01/18/2017   Primary localized osteoarthritis of right knee 07/07/2016   Abnormal hepatitis serology 04/10/2016   Polyarthralgia 04/03/2016   Bilateral anterior knee pain 04/03/2016   Anemia 10/23/2015          Physical Exam   Triage Vital Signs: ED Triage Vitals  Enc Vitals Group     BP 10/18/22 1135 (!) 159/107     Pulse Rate 10/18/22 1135 99     Resp 10/18/22 1135 18     Temp 10/18/22 1135 99 F (37.2 C)     Temp Source 10/18/22 1135 Oral     SpO2 10/18/22 1135 96 %     Weight 10/18/22 1137 194 lb 0.1 oz (88 kg)     Height 10/18/22 1137 5' 4"$  (1.626 m)     Head Circumference --      Peak Flow --      Pain Score 10/18/22 1136 10     Pain Loc --      Pain Edu? --      Excl. in [place]? --  Most recent vital signs: Vitals:   10/18/22 1135  BP: (!) 159/107  Pulse: 99  Resp: 18  Temp: 99 F (37.2 C)  SpO2: 96%    Physical Exam Vitals and nursing note reviewed.  Constitutional:      General: Awake and alert. No acute distress.    Appearance: Normal appearance. The patient is normal weight.  HENT:     Head: Normocephalic and atraumatic.     Mouth: Mucous membranes are moist. Uvula midline.  Mild posterior oropharyngeal erythema.  No tonsillar exudate.  No soft palate fluctuance.  No trismus.  No voice change.  No sublingual swelling.  No tender cervical lymphadenopathy.   No nuchal rigidity Eyes:     General: PERRL. Normal EOMs        Right eye: No discharge.        Left eye: No discharge.     Conjunctiva/sclera: Conjunctivae normal.  Cardiovascular:     Rate and Rhythm: Normal rate and regular rhythm.     Pulses: Normal pulses.  Pulmonary:     Effort: Pulmonary effort is normal. No respiratory distress.     Breath sounds: Normal breath sounds.  Abdominal:     Abdomen is soft. There is no abdominal tenderness. No rebound or guarding. No distention. Musculoskeletal:        General: No swelling. Normal range of motion.     Cervical back: Normal range of motion and neck supple.  Skin:    General: Skin is warm and dry.     Capillary Refill: Capillary refill takes less than 2 seconds.     Findings: No rash.  Neurological:     Mental Status: The patient is awake and alert.      ED Results / Procedures / Treatments   Labs (all labs ordered are listed, but only abnormal results are displayed) Labs Reviewed  CBC WITH DIFFERENTIAL/PLATELET - Abnormal; Notable for the following components:      Result Value   WBC 11.9 (*)    Eosinophils Absolute 0.8 (*)    All other components within normal limits  RESP PANEL BY RT-PCR (RSV, FLU A&B, COVID)  RVPGX2  GROUP A STREP BY PCR  BASIC METABOLIC PANEL     EKG     RADIOLOGY I independently reviewed and interpreted imaging and agree with radiologists findings.     PROCEDURES:  Critical Care performed:   Procedures   MEDICATIONS ORDERED IN ED: Medications  iohexol (OMNIPAQUE) 300 MG/ML solution 75 mL (75 mLs Intravenous Contrast Given 10/18/22 1343)     IMPRESSION / MDM / ASSESSMENT AND PLAN / ED COURSE  I reviewed the triage vital signs and the nursing notes.   Differential diagnosis includes, but is not limited to, strep pharyngitis, COVID-19, laryngitis, vocal cord polyp, retropharyngeal abscess, peritonsillar abscess, influenza.  Patient is awake and alert, hemodynamically stable  and afebrile.  She is able to speak easily in complete sentences.  There is no trismus or drooling.  There is no uvular deviation.  I reviewed the patient's chart.  Patient was seen in the emergency department 2 days ago with similar complaints.  At that time she had COVID/flu/RSV and strep swabs which were negative.  Further workup is indicated today given that her symptoms are not improving in she feels that they are worsening.  IV was established and labs were obtained.  Labs revealed leukocytosis to 11.1.  Otherwise reassuring.  Her repeat strep, COVID, flu, and RSV swab was negative again.  Given her pain, and her subjective difficulty swallowing, CT scan was obtained for evaluation of other etiology including mass or abscess.  CT scan reveals findings consistent with tonsillitis without abscess.  Patient feels strongly that she would like an antibiotic, and she was subsequently given Augmentin.  We discussed however that many of these cases are viral.  We discussed return precautions and the importance of close outpatient follow-up. She was discharged in stable condition with her husband.  Also recommended that she follow-up with her PCP to ensure that her symptoms and lymphadenopathy have resolved.  Patient understands and agrees with plan.    Spanish interpreter was used for the full duration of history, physical exam, discussion of results and recommendations and return precautions.   Patient's presentation is most consistent with acute complicated illness / injury requiring diagnostic workup.    FINAL CLINICAL IMPRESSION(S) / ED DIAGNOSES   Final diagnoses:  Tonsillitis     Rx / DC Orders   ED Discharge Orders          Ordered    amoxicillin-clavulanate (AUGMENTIN) 875-125 MG tablet  2 times daily        10/18/22 1433             Note:  This document was prepared using Dragon voice recognition software and may include unintentional dictation errors.   Marquette Old,  PA-C 10/18/22 1449    Vanessa Fairfield, MD 10/19/22 [phone]

## 2022-11-25 ENCOUNTER — Other Ambulatory Visit: Payer: Self-pay

## 2022-11-25 DIAGNOSIS — R1012 Left upper quadrant pain: Secondary | ICD-10-CM

## 2022-12-14 ENCOUNTER — Ambulatory Visit
Admission: RE | Admit: 2022-12-14 | Discharge: 2022-12-14 | Disposition: A | Discharge status: Home / Self Care | Payer: 59 | Source: Ambulatory Visit | Attending: Gastroenterology | Admitting: Gastroenterology

## 2022-12-14 DIAGNOSIS — R1012 Left upper quadrant pain: Secondary | ICD-10-CM | POA: Diagnosis present

## 2023-05-05 ENCOUNTER — Encounter: Payer: Self-pay | Admitting: Obstetrics and Gynecology

## 2023-05-05 ENCOUNTER — Ambulatory Visit (INDEPENDENT_AMBULATORY_CARE_PROVIDER_SITE_OTHER): Payer: 59 | Admitting: Obstetrics and Gynecology

## 2023-05-05 VITALS — BP 180/101 | HR 66 | Ht 64.0 in | Wt 200.5 lb

## 2023-05-05 DIAGNOSIS — N8189 Other female genital prolapse: Secondary | ICD-10-CM | POA: Diagnosis not present

## 2023-05-05 DIAGNOSIS — Z01818 Encounter for other preprocedural examination: Secondary | ICD-10-CM

## 2023-05-05 DIAGNOSIS — N393 Stress incontinence (female) (male): Secondary | ICD-10-CM

## 2023-05-05 NOTE — Progress Notes (Signed)
Patient presents today to discuss her surgical options. She states her stress incontinence remains a large daily problem for her. No additional concerns.

## 2023-05-05 NOTE — Progress Notes (Signed)
HPI:      [name] is a 57 y.o. [phone] who LMP was Patient's last menstrual period was 01/07/2017 (exact date).  Subjective:   She presents today because she has reconsidered her surgical options for pelvic prolapse and stress incontinence.  She now states that she would like surgery as soon as possible.  She reports that her urine loss has become significantly worse in the last year.  She says that she wears a pad 24 hours/day.  She leaks urine every single day with coughing laughing and sneezing. Of significant note, she has had a prior hysterectomy.  She reports that after her hysterectomy she lost her desire for intercourse. She is married and thinks that perhaps after this surgery she will feel more able to have intercourse.    Hx: The following portions of the patient's history were reviewed and updated as appropriate:             She  has a past medical history of Anemia, Anemia, Anxiety, Arthritis, COVID-19, Depression, Dyspepsia, GERD (gastroesophageal reflux disease), Heart burn, Hypertension, and Irregular menstrual cycle. She does not have any pertinent problems on file. She  has a past surgical history that includes Cholecystectomy, laparoscopic; Laparoscopic ovarian cystectomy (Right); Tubal ligation; Meniscectomy (Right); Total knee arthroplasty (Right, 07/07/2016); Cholecystectomy; Joint replacement; Knee arthroscopy (Left, 12/03/2016); Abdominal hysterectomy (Left, 01/18/2017); Total knee arthroplasty (Left, 03/16/2017); Knee Closed Reduction (Left, 04/15/2017); Knee arthrotomy (Left, 10/21/2017); Esophagogastroduodenoscopy (N/A, 11/04/2017); Colonoscopy with propofol (N/A, 11/04/2017); Esophagogastroduodenoscopy; and Esophagogastroduodenoscopy (egd) with propofol (N/A, 05/30/2019). Her family history includes Diabetes in her father; Heart failure in her father; Hypertension in her father; Osteoarthritis in her mother and sister. She  reports that she quit smoking about 8 years  ago. Her smoking use included cigarettes. She has never used smokeless tobacco. She reports current alcohol use. She reports that she does not use drugs. She has a current medication list which includes the following prescription(s): diclofenac, gentamicin, lisinopril-hydrochlorothiazide, metoprolol succinate, naphazoline-pheniramine, pantoprazole, triamcinolone ointment, vitamin d (ergocalciferol), albuterol, cyclobenzaprine, gabapentin, prednisone, sertraline, tizanidine, tramadol, trazodone, and zolpidem, and the following Facility-Administered Medications: triamcinolone cream. She has No Known Allergies.       Review of Systems:  Review of Systems  Constitutional: Denied constitutional symptoms, night sweats, recent illness, fatigue, fever, insomnia and weight loss.  Eyes: Denied eye symptoms, eye pain, photophobia, vision change and visual disturbance.  Ears/Nose/Throat/Neck: Denied ear, nose, throat or neck symptoms, hearing loss, nasal discharge, sinus congestion and sore throat.  Cardiovascular: Denied cardiovascular symptoms, arrhythmia, chest pain/pressure, edema, exercise intolerance, orthopnea and palpitations.  Respiratory: Denied pulmonary symptoms, asthma, pleuritic pain, productive sputum, cough, dyspnea and wheezing.  Gastrointestinal: Denied, gastro-esophageal reflux, melena, nausea and vomiting.  Genitourinary: See HPI for additional information.  Musculoskeletal: Denied musculoskeletal symptoms, stiffness, swelling, muscle weakness and myalgia.  Dermatologic: Denied dermatology symptoms, rash and scar.  Neurologic: Denied neurology symptoms, dizziness, headache, neck pain and syncope.  Psychiatric: Denied psychiatric symptoms, anxiety and depression.  Endocrine: Denied endocrine symptoms including hot flashes and night sweats.   Meds:   Current Outpatient Medications on File Prior to Visit  Medication Sig Dispense Refill   DICLOFENAC PO Take by mouth.     gentamicin  (GARAMYCIN) 0.3 % ophthalmic solution Place 1 drop into both eyes every 4 (four) hours. 5 mL 0   lisinopril-hydrochlorothiazide (ZESTORETIC) 20-25 MG tablet Take 1 tablet by mouth daily.     metoprolol succinate (TOPROL-XL) 25 MG 24 hr tablet Take 25 mg by mouth daily.  naphazoline-pheniramine (NAPHCON-A) 0.025-0.3 % ophthalmic solution Place 1 drop into both eyes 4 (four) times daily as needed for eye irritation. 15 mL 0   pantoprazole (PROTONIX) 40 MG tablet Take 40 mg by mouth 2 (two) times daily as needed.      triamcinolone ointment (KENALOG) 0.5 % APPLY  OINTMENT TOPICALLY TO AFFECTED AREA AT BEDTIME 30 g 0   Vitamin D, Ergocalciferol, (DRISDOL) 50000 units CAPS capsule Take 50,000 Units by mouth every 7 (seven) days.     albuterol (VENTOLIN HFA) 108 (90 Base) MCG/ACT inhaler Inhale 2 puffs into the lungs every 4 (four) hours as needed for wheezing or shortness of breath. (Patient not taking: Reported on 05/05/2023) 1 each 0   cyclobenzaprine (FLEXERIL) 10 MG tablet Take 1 tablet (10 mg total) by mouth 3 (three) times daily as needed for muscle spasms. (Patient not taking: Reported on 05/05/2023) 20 tablet 0   gabapentin (NEURONTIN) 100 MG capsule Take 100 mg by mouth 2 (two) times daily. (Patient not taking: Reported on 05/05/2023)     predniSONE (DELTASONE) 10 MG tablet Take 3 tablets (30 mg total) by mouth daily with breakfast. (Patient not taking: Reported on 05/05/2023) 9 tablet 0   sertraline (ZOLOFT) 50 MG tablet Take 50 mg by mouth daily.  (Patient not taking: Reported on 05/05/2023)     tiZANidine (ZANAFLEX) 2 MG tablet Take 2 mg by mouth 3 times/day as needed-between meals & bedtime.  (Patient not taking: Reported on 05/05/2023)     traMADol (ULTRAM) 50 MG tablet Take 1 tablet (50 mg total) by mouth as needed. (Patient not taking: Reported on 05/05/2023) 40 tablet 1   traZODone (DESYREL) 50 MG tablet Take 50 mg by mouth at bedtime. (Patient not taking: Reported on 05/05/2023)     zolpidem (AMBIEN)  10 MG tablet Take 10 mg by mouth at bedtime. (Patient not taking: Reported on 05/05/2023)     Current Facility-Administered Medications on File Prior to Visit  Medication Dose Route Frequency Provider Last Rate Last Admin   triamcinolone cream (KENALOG) 0.1 %   Topical QHS Linzie Collin, MD          Objective:     Vitals:   05/05/23 1322 05/05/23 1329  BP: (!) 173/107 (!) 180/101  Pulse: 66    Filed Weights   05/05/23 1322  Weight: 200 lb 8 oz (90.9 kg)                        Assessment:    [id number] Patient Active Problem List   Diagnosis Date Noted   Chronic pain syndrome 10/16/2019   Cervicalgia 10/16/2019   Chronic neck pain (1ry area of Pain) (Bilateral) (L>R) 10/16/2019   Chronic shoulder pain (Left) 10/16/2019   Chronic upper extremity pain (Bilateral) 10/16/2019   Chronic low back pain (Bilateral) w/o sciatica 10/16/2019   Chronic lower extremity pain (Bilateral) (R>L) 10/16/2019   Chronic knee pain after total replacement (Bilateral) (L>R) 10/16/2019   Pharmacologic therapy 10/16/2019   Disorder of skeletal system 10/16/2019   Problems influencing health status 10/16/2019   DDD (degenerative disc disease), cervical 10/16/2019   DDD (degenerative disc disease), lumbosacral 10/16/2019   Abnormal MRI, cervical spine (09/29/2019) 10/16/2019   Bursitis of left shoulder 08/10/2019   Left cervical radiculopathy 08/10/2019   Bilateral arm pain 01/11/2019   Vitamin D deficiency 05/03/2018   Nonrheumatic aortic valve insufficiency 02/28/2018   Non-rheumatic mitral regurgitation 02/28/2018   Non-rheumatic tricuspid valve insufficiency  02/28/2018   Heart palpitations 02/07/2018   Hypertension 02/07/2018   Chronic pain of wrist (Left) 10/27/2017   Myofascial muscle pain 10/27/2017   Synovitis of left knee 10/21/2017   Chest pain on breathing 05/27/2017   Dizziness 05/27/2017   Episodic lightheadedness 05/27/2017   Myalgia 05/12/2017   Primary localized  osteoarthritis of left knee 03/16/2017   Surgical menopause, symptomatic 01/26/2017   Status post abdominal hysterectomy and left salpingo-oophorectomy 01/26/2017   S/P TAH (total abdominal hysterectomy) 01/20/2017   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 Stress incontinence   2. Pelvic relaxation disorder     Worsening stress incontinence with daily urine loss   Plan:            1.  Patient to return for preop examination and further discussion regarding AMP repair with TOT. We have discussed expectations for TOT and limitations of lifting after surgery.  We have discussed the possibility of indwelling Foley catheter after her surgery.  I believe she has a good understanding of her current condition and the expectations for surgery.  She will reschedule for preop. Orders No orders of the defined types were placed in this encounter.   No orders of the defined types were placed in this encounter.     F/U  Return in about 1 week (around 05/12/2023). I spent 31 minutes involved in the care of this patient preparing to see the patient by obtaining and reviewing her medical history (including labs, imaging tests and prior procedures), documenting clinical information in the electronic health record (EHR), counseling and coordinating care plans, writing and sending prescriptions, ordering tests or procedures and in direct communicating with the patient and medical staff discussing pertinent items from her history and physical exam.  Elonda Husky, M.D. 05/05/2023 2:06 PM

## 2023-05-13 ENCOUNTER — Encounter: Payer: 59 | Admitting: Obstetrics and Gynecology

## 2023-05-13 DIAGNOSIS — N393 Stress incontinence (female) (male): Secondary | ICD-10-CM

## 2023-05-13 DIAGNOSIS — N8189 Other female genital prolapse: Secondary | ICD-10-CM

## 2023-05-13 DIAGNOSIS — Z01818 Encounter for other preprocedural examination: Secondary | ICD-10-CM

## 2023-05-18 ENCOUNTER — Encounter: Payer: Self-pay | Admitting: Obstetrics and Gynecology

## 2023-05-18 ENCOUNTER — Ambulatory Visit (INDEPENDENT_AMBULATORY_CARE_PROVIDER_SITE_OTHER): Payer: 59 | Admitting: Obstetrics and Gynecology

## 2023-05-18 VITALS — BP 117/80 | HR 67 | Ht 64.0 in | Wt 198.8 lb

## 2023-05-18 DIAGNOSIS — Z01818 Encounter for other preprocedural examination: Secondary | ICD-10-CM

## 2023-05-18 DIAGNOSIS — N393 Stress incontinence (female) (male): Secondary | ICD-10-CM

## 2023-05-18 DIAGNOSIS — N8189 Other female genital prolapse: Secondary | ICD-10-CM

## 2023-05-18 MED ORDER — METRONIDAZOLE 500 MG PO TABS: 500.0000 mg | ORAL_TABLET | Freq: Two times a day (BID) | ORAL | 0 refills | Status: DC

## 2023-05-18 NOTE — Progress Notes (Signed)
Patient presents today for a pre-op exam prior to A&P repair with TOT. She states no additional concerns today.

## 2023-05-18 NOTE — H&P (Signed)
PRE-OPERATIVE HISTORY AND PHYSICAL EXAM  PCP:  Preston Fleeting, MD Subjective:   HPI:  Caitlyn [name] is a 57 y.o. [id number].  Patient's last menstrual period was 01/07/2017 (exact date).  She presents today for a pre-op discussion and PE.  She has the following symptoms: Pelvic prolapse and stress urinary incontinence.  Review of Systems:   Constitutional: Denied constitutional symptoms, night sweats, recent illness, fatigue, fever, insomnia and weight loss.  Eyes: Denied eye symptoms, eye pain, photophobia, vision change and visual disturbance.  Ears/Nose/Throat/Neck: Denied ear, nose, throat or neck symptoms, hearing loss, nasal discharge, sinus congestion and sore throat.  Cardiovascular: Denied cardiovascular symptoms, arrhythmia, chest pain/pressure, edema, exercise intolerance, orthopnea and palpitations.  Respiratory: Denied pulmonary symptoms, asthma, pleuritic pain, productive sputum, cough, dyspnea and wheezing.  Gastrointestinal: Denied, gastro-esophageal reflux, melena, nausea and vomiting.  Genitourinary: See HPI for additional information.  Musculoskeletal: Denied musculoskeletal symptoms, stiffness, swelling, muscle weakness and myalgia.  Dermatologic: Denied dermatology symptoms, rash and scar.  Neurologic: Denied neurology symptoms, dizziness, headache, neck pain and syncope.  Psychiatric: Denied psychiatric symptoms, anxiety and depression.  Endocrine: Denied endocrine symptoms including hot flashes and night sweats.   OB History  Gravida Para Term Preterm AB Living  4 3 3   1 3   SAB IAB Ectopic Multiple Live Births  1       3    # Outcome Date GA Lbr Len/2nd Weight Sex Type Anes PTL Lv  4 Term 02/02/91    M Vag-Spont   LIV  3 SAB 1992        FD  2 Term 04/16/89    M Vag-Spont   LIV  1 Term 06/24/84    [name]   LIV    Past Medical History:  Diagnosis Date   Anemia    Anemia    Anxiety    Arthritis    COVID-19    Depression     Dyspepsia    GERD (gastroesophageal reflux disease)    Heart burn    Hypertension    Irregular menstrual cycle     Past Surgical History:  Procedure Laterality Date   ABDOMINAL HYSTERECTOMY Left 01/18/2017   Procedure: HYSTERECTOMY ABDOMINAL WITH LEFT SALPINGO OOPHERECTOMY;  Surgeon: Herold Harms, MD;  Location: [facility];  Service: Gynecology;  Laterality: Left;   CHOLECYSTECTOMY     CHOLECYSTECTOMY, LAPAROSCOPIC     COLONOSCOPY WITH PROPOFOL N/A 11/04/2017   Procedure: COLONOSCOPY WITH PROPOFOL;  Surgeon: Christena Deem, MD;  Location: [facility] ENDOSCOPY;  Service: Endoscopy;  Laterality: N/A;   ESOPHAGOGASTRODUODENOSCOPY N/A 11/04/2017   Procedure: ESOPHAGOGASTRODUODENOSCOPY (EGD);  Surgeon: Christena Deem, MD;  Location: [facility];  Service: Endoscopy;  Laterality: N/A;   ESOPHAGOGASTRODUODENOSCOPY     ESOPHAGOGASTRODUODENOSCOPY (EGD) WITH PROPOFOL N/A 05/30/2019   Procedure: ESOPHAGOGASTRODUODENOSCOPY (EGD) WITH PROPOFOL;  Surgeon: Toledo, Boykin Nearing, MD;  Location: [facility] ENDOSCOPY;  Service: Gastroenterology;  Laterality: N/A;   JOINT REPLACEMENT     KNEE ARTHROSCOPY Left 12/03/2016   Procedure: ARTHROSCOPY KNEE, partial medial menisectomy;  Surgeon: Kennedy Bucker, MD;  Location: [facility];  Service: Orthopedics;  Laterality: Left;   KNEE ARTHROTOMY Left 10/21/2017   Procedure: KNEE ARTHROTOMY LEFT LATERAL RELEASE MEDIAL RETINACULART REPAIR POLY EXCHANGE;  Surgeon: Kennedy Bucker, MD;  Location: [facility];  Service: Orthopedics;  Laterality: Left;   KNEE CLOSED REDUCTION Left 04/15/2017   Procedure: CLOSED MANIPULATION KNEE;  Surgeon: Kennedy Bucker, MD;  Location: [facility];  Service: Orthopedics;  Laterality: Left;   LAPAROSCOPIC OVARIAN CYSTECTOMY Right    MENISCECTOMY Right    TOTAL KNEE ARTHROPLASTY Right 07/07/2016   Procedure: TOTAL KNEE ARTHROPLASTY;  Surgeon: Kennedy Bucker, MD;  Location: [facility];  Service: Orthopedics;  Laterality: Right;   TOTAL KNEE ARTHROPLASTY Left  03/16/2017   Procedure: TOTAL KNEE ARTHROPLASTY;  Surgeon: Kennedy Bucker, MD;  Location: [facility];  Service: Orthopedics;  Laterality: Left;   TUBAL LIGATION        SOCIAL HISTORY:  Social History   Tobacco Use  Smoking Status Former   Current packs/day: 0.00   Types: Cigarettes   Quit date: 03/01/2015   Years since quitting: 8.2  Smokeless Tobacco Never   Social History   Substance and Sexual Activity  Alcohol Use Yes   Alcohol/week: 0.0 standard drinks of alcohol   Comment: occasionally    Social History   Substance and Sexual Activity  Drug Use No    Family History  Problem Relation Age of Onset   Osteoarthritis Mother    Heart failure Father    Diabetes Father    Hypertension Father    Osteoarthritis Sister    Breast cancer Neg Hx    Ovarian cancer Neg Hx    Colon cancer Neg Hx     ALLERGIES:  Patient has no known allergies.  MEDS:   Current Outpatient Medications on File Prior to Visit  Medication Sig Dispense Refill   DICLOFENAC PO Take by mouth.     gentamicin (GARAMYCIN) 0.3 % ophthalmic solution Place 1 drop into both eyes every 4 (four) hours. 5 mL 0   lisinopril-hydrochlorothiazide (ZESTORETIC) 20-25 MG tablet Take 1 tablet by mouth daily.     metoprolol succinate (TOPROL-XL) 25 MG 24 hr tablet Take 25 mg by mouth daily.     naphazoline-pheniramine (NAPHCON-A) 0.025-0.3 % ophthalmic solution Place 1 drop into both eyes 4 (four) times daily as needed for eye irritation. 15 mL 0   pantoprazole (PROTONIX) 40 MG tablet Take 40 mg by mouth 2 (two) times daily as needed.      triamcinolone ointment (KENALOG) 0.5 % APPLY  OINTMENT TOPICALLY TO AFFECTED AREA AT BEDTIME 30 g 0   Vitamin D, Ergocalciferol, (DRISDOL) 50000 units CAPS capsule Take 50,000 Units by mouth every 7 (seven) days.     albuterol (VENTOLIN HFA) 108 (90 Base) MCG/ACT inhaler Inhale 2 puffs into the lungs every 4 (four) hours as needed for wheezing or shortness of breath. (Patient not  taking: Reported on 05/05/2023) 1 each 0   cyclobenzaprine (FLEXERIL) 10 MG tablet Take 1 tablet (10 mg total) by mouth 3 (three) times daily as needed for muscle spasms. (Patient not taking: Reported on 05/05/2023) 20 tablet 0   gabapentin (NEURONTIN) 100 MG capsule Take 100 mg by mouth 2 (two) times daily. (Patient not taking: Reported on 05/05/2023)     predniSONE (DELTASONE) 10 MG tablet Take 3 tablets (30 mg total) by mouth daily with breakfast. (Patient not taking: Reported on 05/05/2023) 9 tablet 0   sertraline (ZOLOFT) 50 MG tablet Take 50 mg by mouth daily.  (Patient not taking: Reported on 05/05/2023)     tiZANidine (ZANAFLEX) 2 MG tablet Take 2 mg by mouth 3 times/day as needed-between meals & bedtime.  (Patient not taking: Reported on 05/05/2023)     traMADol (ULTRAM) 50 MG tablet Take 1 tablet (50 mg total) by mouth as needed. (Patient not taking: Reported on 05/05/2023) 40 tablet 1   traZODone (DESYREL) 50 MG tablet  Take 50 mg by mouth at bedtime. (Patient not taking: Reported on 05/05/2023)     zolpidem (AMBIEN) 10 MG tablet Take 10 mg by mouth at bedtime. (Patient not taking: Reported on 05/05/2023)     Current Facility-Administered Medications on File Prior to Visit  Medication Dose Route Frequency Provider Last Rate Last Admin   triamcinolone cream (KENALOG) 0.1 %   Topical QHS Linzie Collin, MD        No orders of the defined types were placed in this encounter.    Physical examination BP 117/80   Pulse 67   Ht 5\' 4"  (1.626 m)   Wt 198 lb 12.8 oz (90.2 kg)   LMP 01/07/2017 (Exact Date) Comment: tah/bso  BMI 34.12 kg/m   General NAD, Conversant  HEENT Atraumatic; Op clear with mmm.  Normo-cephalic. Pupils reactive. Anicteric sclerae  Thyroid/Neck Smooth without nodularity or enlargement. Normal ROM.  Neck Supple.  Skin No rashes, lesions or ulceration. Normal palpated skin turgor. No nodularity.  Breasts: No masses or discharge.  Symmetric.  No axillary adenopathy.  Lungs: Clear  to auscultation.No rales or wheezes. Normal Respiratory effort, no retractions.  Heart: NSR.  No murmurs or rubs appreciated. No peripheral edema  Abdomen: Soft.  Non-tender.  No masses.  No HSM. No hernia  Extremities: Moves all appropriately.  Normal ROM for age. No lymphadenopathy.  Neuro: Oriented to PPT.  Normal mood. Normal affect.     Pelvic:   Vulva: Normal appearance.  No lesions.  Vagina: No lesions or abnormalities noted.  Support: Second-degree cystocele second-degree rectocele  Urethra No masses tenderness or scarring.  Meatus Normal size without lesions or prolapse.  Cervix: Surgically absent  Anus: Normal exam.  No lesions.  Perineum: Normal exam.  No lesions.        Bimanual   Uterus: Surgically absent  Adnexae: No masses.  Non-tender to palpation.  Cul-de-sac: Negative for abnormality.   Assessment:   [id number] Patient Active Problem List   Diagnosis Date Noted   Chronic pain syndrome 10/16/2019   Cervicalgia 10/16/2019   Chronic neck pain (1ry area of Pain) (Bilateral) (L>R) 10/16/2019   Chronic shoulder pain (Left) 10/16/2019   Chronic upper extremity pain (Bilateral) 10/16/2019   Chronic low back pain (Bilateral) w/o sciatica 10/16/2019   Chronic lower extremity pain (Bilateral) (R>L) 10/16/2019   Chronic knee pain after total replacement (Bilateral) (L>R) 10/16/2019   Pharmacologic therapy 10/16/2019   Disorder of skeletal system 10/16/2019   Problems influencing health status 10/16/2019   DDD (degenerative disc disease), cervical 10/16/2019   DDD (degenerative disc disease), lumbosacral 10/16/2019   Abnormal MRI, cervical spine (09/29/2019) 10/16/2019   Bursitis of left shoulder 08/10/2019   Left cervical radiculopathy 08/10/2019   Bilateral arm pain 01/11/2019   Vitamin D deficiency 05/03/2018   Nonrheumatic aortic valve insufficiency 02/28/2018   Non-rheumatic mitral regurgitation 02/28/2018   Non-rheumatic tricuspid valve insufficiency 02/28/2018    Heart palpitations 02/07/2018   Hypertension 02/07/2018   Chronic pain of wrist (Left) 10/27/2017   Myofascial muscle pain 10/27/2017   Synovitis of left knee 10/21/2017   Chest pain on breathing 05/27/2017   Dizziness 05/27/2017   Episodic lightheadedness 05/27/2017   Myalgia 05/12/2017   Primary localized osteoarthritis of left knee 03/16/2017   Surgical menopause, symptomatic 01/26/2017   Status post abdominal hysterectomy and left salpingo-oophorectomy 01/26/2017   S/P TAH (total abdominal hysterectomy) 01/20/2017   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 Pre-op exam   2. Stress incontinence   3. Pelvic relaxation disorder      Patient symptomatic with pelvic prolapse and urinary incontinence.  She reports that this problem was bad a year ago but has gotten significantly worse over the last year.  She would like definitive management at this time.   Plan:   Orders: No orders of the defined types were placed in this encounter.    1.  Anterior and posterior repair with TOT

## 2023-05-18 NOTE — H&P (View-Only) (Signed)
PRE-OPERATIVE HISTORY AND PHYSICAL EXAM  PCP:  Caitlyn Fleeting, MD Subjective:   HPI:  Caitlyn [name] is a 57 y.o. [id number].  Patient's last menstrual period was 01/07/2017 (exact date).  She presents today for a pre-op discussion and PE.  She has the following symptoms: Pelvic prolapse and stress urinary incontinence.  Review of Systems:   Constitutional: Denied constitutional symptoms, night sweats, recent illness, fatigue, fever, insomnia and weight loss.  Eyes: Denied eye symptoms, eye pain, photophobia, vision change and visual disturbance.  Ears/Nose/Throat/Neck: Denied ear, nose, throat or neck symptoms, hearing loss, nasal discharge, sinus congestion and sore throat.  Cardiovascular: Denied cardiovascular symptoms, arrhythmia, chest pain/pressure, edema, exercise intolerance, orthopnea and palpitations.  Respiratory: Denied pulmonary symptoms, asthma, pleuritic pain, productive sputum, cough, dyspnea and wheezing.  Gastrointestinal: Denied, gastro-esophageal reflux, melena, nausea and vomiting.  Genitourinary: See HPI for additional information.  Musculoskeletal: Denied musculoskeletal symptoms, stiffness, swelling, muscle weakness and myalgia.  Dermatologic: Denied dermatology symptoms, rash and scar.  Neurologic: Denied neurology symptoms, dizziness, headache, neck pain and syncope.  Psychiatric: Denied psychiatric symptoms, anxiety and depression.  Endocrine: Denied endocrine symptoms including hot flashes and night sweats.   OB History  Gravida Para Term Preterm AB Living  4 3 3   1 3   SAB IAB Ectopic Multiple Live Births  1       3    # Outcome Date GA Lbr Len/2nd Weight Sex Type Anes PTL Lv  4 Term 02/02/91    M Vag-Spont   LIV  3 SAB 1992        FD  2 Term 04/16/89    M Vag-Spont   LIV  1 Term 06/24/84    Caitlyn [name]   LIV    Past Medical History:  Diagnosis Date   Anemia    Anemia    Anxiety    Arthritis    COVID-19    Depression     Dyspepsia    GERD (gastroesophageal reflux disease)    Heart burn    Hypertension    Irregular menstrual cycle     Past Surgical History:  Procedure Laterality Date   ABDOMINAL HYSTERECTOMY Left 01/18/2017   Procedure: HYSTERECTOMY ABDOMINAL WITH LEFT SALPINGO OOPHERECTOMY;  Surgeon: Herold Harms, MD;  Location: [facility];  Service: Gynecology;  Laterality: Left;   CHOLECYSTECTOMY     CHOLECYSTECTOMY, LAPAROSCOPIC     COLONOSCOPY WITH PROPOFOL N/A 11/04/2017   Procedure: COLONOSCOPY WITH PROPOFOL;  Surgeon: Christena Deem, MD;  Location: [facility] ENDOSCOPY;  Service: Endoscopy;  Laterality: N/A;   ESOPHAGOGASTRODUODENOSCOPY N/A 11/04/2017   Procedure: ESOPHAGOGASTRODUODENOSCOPY (EGD);  Surgeon: Christena Deem, MD;  Location: [facility];  Service: Endoscopy;  Laterality: N/A;   ESOPHAGOGASTRODUODENOSCOPY     ESOPHAGOGASTRODUODENOSCOPY (EGD) WITH PROPOFOL N/A 05/30/2019   Procedure: ESOPHAGOGASTRODUODENOSCOPY (EGD) WITH PROPOFOL;  Surgeon: Toledo, Boykin Nearing, MD;  Location: [facility] ENDOSCOPY;  Service: Gastroenterology;  Laterality: N/A;   JOINT REPLACEMENT     KNEE ARTHROSCOPY Left 12/03/2016   Procedure: ARTHROSCOPY KNEE, partial medial menisectomy;  Surgeon: Kennedy Bucker, MD;  Location: [facility];  Service: Orthopedics;  Laterality: Left;   KNEE ARTHROTOMY Left 10/21/2017   Procedure: KNEE ARTHROTOMY LEFT LATERAL RELEASE MEDIAL RETINACULART REPAIR POLY EXCHANGE;  Surgeon: Kennedy Bucker, MD;  Location: [facility];  Service: Orthopedics;  Laterality: Left;   KNEE CLOSED REDUCTION Left 04/15/2017   Procedure: CLOSED MANIPULATION KNEE;  Surgeon: Kennedy Bucker, MD;  Location: [facility];  Service: Orthopedics;  Laterality: Left;   LAPAROSCOPIC OVARIAN CYSTECTOMY Right    MENISCECTOMY Right    TOTAL KNEE ARTHROPLASTY Right 07/07/2016   Procedure: TOTAL KNEE ARTHROPLASTY;  Surgeon: Kennedy Bucker, MD;  Location: [facility];  Service: Orthopedics;  Laterality: Right;   TOTAL KNEE ARTHROPLASTY Left  03/16/2017   Procedure: TOTAL KNEE ARTHROPLASTY;  Surgeon: Kennedy Bucker, MD;  Location: [facility];  Service: Orthopedics;  Laterality: Left;   TUBAL LIGATION        SOCIAL HISTORY:  Social History   Tobacco Use  Smoking Status Former   Current packs/day: 0.00   Types: Cigarettes   Quit date: 03/01/2015   Years since quitting: 8.2  Smokeless Tobacco Never   Social History   Substance and Sexual Activity  Alcohol Use Yes   Alcohol/week: 0.0 standard drinks of alcohol   Comment: occasionally    Social History   Substance and Sexual Activity  Drug Use No    Family History  Problem Relation Age of Onset   Osteoarthritis Mother    Heart failure Father    Diabetes Father    Hypertension Father    Osteoarthritis Sister    Breast cancer Neg Hx    Ovarian cancer Neg Hx    Colon cancer Neg Hx     ALLERGIES:  Patient has no known allergies.  MEDS:   Current Outpatient Medications on File Prior to Visit  Medication Sig Dispense Refill   DICLOFENAC PO Take by mouth.     gentamicin (GARAMYCIN) 0.3 % ophthalmic solution Place 1 drop into both eyes every 4 (four) hours. 5 mL 0   lisinopril-hydrochlorothiazide (ZESTORETIC) 20-25 MG tablet Take 1 tablet by mouth daily.     metoprolol succinate (TOPROL-XL) 25 MG 24 hr tablet Take 25 mg by mouth daily.     naphazoline-pheniramine (NAPHCON-A) 0.025-0.3 % ophthalmic solution Place 1 drop into both eyes 4 (four) times daily as needed for eye irritation. 15 mL 0   pantoprazole (PROTONIX) 40 MG tablet Take 40 mg by mouth 2 (two) times daily as needed.      triamcinolone ointment (KENALOG) 0.5 % APPLY  OINTMENT TOPICALLY TO AFFECTED AREA AT BEDTIME 30 g 0   Vitamin D, Ergocalciferol, (DRISDOL) 50000 units CAPS capsule Take 50,000 Units by mouth every 7 (seven) days.     albuterol (VENTOLIN HFA) 108 (90 Base) MCG/ACT inhaler Inhale 2 puffs into the lungs every 4 (four) hours as needed for wheezing or shortness of breath. (Patient not  taking: Reported on 05/05/2023) 1 each 0   cyclobenzaprine (FLEXERIL) 10 MG tablet Take 1 tablet (10 mg total) by mouth 3 (three) times daily as needed for muscle spasms. (Patient not taking: Reported on 05/05/2023) 20 tablet 0   gabapentin (NEURONTIN) 100 MG capsule Take 100 mg by mouth 2 (two) times daily. (Patient not taking: Reported on 05/05/2023)     predniSONE (DELTASONE) 10 MG tablet Take 3 tablets (30 mg total) by mouth daily with breakfast. (Patient not taking: Reported on 05/05/2023) 9 tablet 0   sertraline (ZOLOFT) 50 MG tablet Take 50 mg by mouth daily.  (Patient not taking: Reported on 05/05/2023)     tiZANidine (ZANAFLEX) 2 MG tablet Take 2 mg by mouth 3 times/day as needed-between meals & bedtime.  (Patient not taking: Reported on 05/05/2023)     traMADol (ULTRAM) 50 MG tablet Take 1 tablet (50 mg total) by mouth as needed. (Patient not taking: Reported on 05/05/2023) 40 tablet 1   traZODone (DESYREL) 50 MG tablet  Take 50 mg by mouth at bedtime. (Patient not taking: Reported on 05/05/2023)     zolpidem (AMBIEN) 10 MG tablet Take 10 mg by mouth at bedtime. (Patient not taking: Reported on 05/05/2023)     Current Facility-Administered Medications on File Prior to Visit  Medication Dose Route Frequency Provider Last Rate Last Admin   triamcinolone cream (KENALOG) 0.1 %   Topical QHS Linzie Collin, MD        No orders of the defined types were placed in this encounter.    Physical examination BP 117/80   Pulse 67   Ht 5\' 4"  (1.626 m)   Wt 198 lb 12.8 oz (90.2 kg)   LMP 01/07/2017 (Exact Date) Comment: tah/bso  BMI 34.12 kg/m   General NAD, Conversant  HEENT Atraumatic; Op clear with mmm.  Normo-cephalic. Pupils reactive. Anicteric sclerae  Thyroid/Neck Smooth without nodularity or enlargement. Normal ROM.  Neck Supple.  Skin No rashes, lesions or ulceration. Normal palpated skin turgor. No nodularity.  Breasts: No masses or discharge.  Symmetric.  No axillary adenopathy.  Lungs: Clear  to auscultation.No rales or wheezes. Normal Respiratory effort, no retractions.  Heart: NSR.  No murmurs or rubs appreciated. No peripheral edema  Abdomen: Soft.  Non-tender.  No masses.  No HSM. No hernia  Extremities: Moves all appropriately.  Normal ROM for age. No lymphadenopathy.  Neuro: Oriented to PPT.  Normal mood. Normal affect.     Pelvic:   Vulva: Normal appearance.  No lesions.  Vagina: No lesions or abnormalities noted.  Support: Second-degree cystocele second-degree rectocele  Urethra No masses tenderness or scarring.  Meatus Normal size without lesions or prolapse.  Cervix: Surgically absent  Anus: Normal exam.  No lesions.  Perineum: Normal exam.  No lesions.        Bimanual   Uterus: Surgically absent  Adnexae: No masses.  Non-tender to palpation.  Cul-de-sac: Negative for abnormality.   Assessment:   [id number] Patient Active Problem List   Diagnosis Date Noted   Chronic pain syndrome 10/16/2019   Cervicalgia 10/16/2019   Chronic neck pain (1ry area of Pain) (Bilateral) (L>R) 10/16/2019   Chronic shoulder pain (Left) 10/16/2019   Chronic upper extremity pain (Bilateral) 10/16/2019   Chronic low back pain (Bilateral) w/o sciatica 10/16/2019   Chronic lower extremity pain (Bilateral) (R>L) 10/16/2019   Chronic knee pain after total replacement (Bilateral) (L>R) 10/16/2019   Pharmacologic therapy 10/16/2019   Disorder of skeletal system 10/16/2019   Problems influencing health status 10/16/2019   DDD (degenerative disc disease), cervical 10/16/2019   DDD (degenerative disc disease), lumbosacral 10/16/2019   Abnormal MRI, cervical spine (09/29/2019) 10/16/2019   Bursitis of left shoulder 08/10/2019   Left cervical radiculopathy 08/10/2019   Bilateral arm pain 01/11/2019   Vitamin D deficiency 05/03/2018   Nonrheumatic aortic valve insufficiency 02/28/2018   Non-rheumatic mitral regurgitation 02/28/2018   Non-rheumatic tricuspid valve insufficiency 02/28/2018    Heart palpitations 02/07/2018   Hypertension 02/07/2018   Chronic pain of wrist (Left) 10/27/2017   Myofascial muscle pain 10/27/2017   Synovitis of left knee 10/21/2017   Chest pain on breathing 05/27/2017   Dizziness 05/27/2017   Episodic lightheadedness 05/27/2017   Myalgia 05/12/2017   Primary localized osteoarthritis of left knee 03/16/2017   Surgical menopause, symptomatic 01/26/2017   Status post abdominal hysterectomy and left salpingo-oophorectomy 01/26/2017   S/P TAH (total abdominal hysterectomy) 01/20/2017   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 Pre-op exam   2. Stress incontinence   3. Pelvic relaxation disorder      Patient symptomatic with pelvic prolapse and urinary incontinence.  She reports that this problem was bad a year ago but has gotten significantly worse over the last year.  She would like definitive management at this time.   Plan:   Orders: No orders of the defined types were placed in this encounter.    1.  Anterior and posterior repair with TOT

## 2023-05-18 NOTE — Progress Notes (Signed)
PRE-OPERATIVE HISTORY AND PHYSICAL EXAM  PCP:  Preston Fleeting, MD Subjective:   HPI:  Caitlyn [name] is a 57 y.o. [id number].  Patient's last menstrual period was 01/07/2017 (exact date).  She presents today for a pre-op discussion and PE.  She has the following symptoms: Pelvic prolapse and stress urinary incontinence.  Review of Systems:   Constitutional: Denied constitutional symptoms, night sweats, recent illness, fatigue, fever, insomnia and weight loss.  Eyes: Denied eye symptoms, eye pain, photophobia, vision change and visual disturbance.  Ears/Nose/Throat/Neck: Denied ear, nose, throat or neck symptoms, hearing loss, nasal discharge, sinus congestion and sore throat.  Cardiovascular: Denied cardiovascular symptoms, arrhythmia, chest pain/pressure, edema, exercise intolerance, orthopnea and palpitations.  Respiratory: Denied pulmonary symptoms, asthma, pleuritic pain, productive sputum, cough, dyspnea and wheezing.  Gastrointestinal: Denied, gastro-esophageal reflux, melena, nausea and vomiting.  Genitourinary: See HPI for additional information.  Musculoskeletal: Denied musculoskeletal symptoms, stiffness, swelling, muscle weakness and myalgia.  Dermatologic: Denied dermatology symptoms, rash and scar.  Neurologic: Denied neurology symptoms, dizziness, headache, neck pain and syncope.  Psychiatric: Denied psychiatric symptoms, anxiety and depression.  Endocrine: Denied endocrine symptoms including hot flashes and night sweats.   OB History  Gravida Para Term Preterm AB Living  4 3 3   1 3   SAB IAB Ectopic Multiple Live Births  1       3    # Outcome Date GA Lbr Len/2nd Weight Sex Type Anes PTL Lv  4 Term 02/02/91    M Vag-Spont   LIV  3 SAB 1992        FD  2 Term 04/16/89    M Vag-Spont   LIV  1 Term 06/24/84    [name]   LIV    Past Medical History:  Diagnosis Date   Anemia    Anemia    Anxiety    Arthritis    COVID-19    Depression     Dyspepsia    GERD (gastroesophageal reflux disease)    Heart burn    Hypertension    Irregular menstrual cycle     Past Surgical History:  Procedure Laterality Date   ABDOMINAL HYSTERECTOMY Left 01/18/2017   Procedure: HYSTERECTOMY ABDOMINAL WITH LEFT SALPINGO OOPHERECTOMY;  Surgeon: Herold Harms, MD;  Location: [facility];  Service: Gynecology;  Laterality: Left;   CHOLECYSTECTOMY     CHOLECYSTECTOMY, LAPAROSCOPIC     COLONOSCOPY WITH PROPOFOL N/A 11/04/2017   Procedure: COLONOSCOPY WITH PROPOFOL;  Surgeon: Christena Deem, MD;  Location: [facility] ENDOSCOPY;  Service: Endoscopy;  Laterality: N/A;   ESOPHAGOGASTRODUODENOSCOPY N/A 11/04/2017   Procedure: ESOPHAGOGASTRODUODENOSCOPY (EGD);  Surgeon: Christena Deem, MD;  Location: [facility] ENDOSCOPY;  Service: Endoscopy;  Laterality: N/A;   ESOPHAGOGASTRODUODENOSCOPY     ESOPHAGOGASTRODUODENOSCOPY (EGD) WITH PROPOFOL N/A 05/30/2019   Procedure: ESOPHAGOGASTRODUODENOSCOPY (EGD) WITH PROPOFOL;  Surgeon: Toledo, Boykin Nearing, MD;  Location: [facility] ENDOSCOPY;  Service: Gastroenterology;  Laterality: N/A;   JOINT REPLACEMENT     KNEE ARTHROSCOPY Left 12/03/2016   Procedure: ARTHROSCOPY KNEE, partial medial menisectomy;  Surgeon: Kennedy Bucker, MD;  Location: [facility];  Service: Orthopedics;  Laterality: Left;   KNEE ARTHROTOMY Left 10/21/2017   Procedure: KNEE ARTHROTOMY LEFT LATERAL RELEASE MEDIAL RETINACULART REPAIR POLY EXCHANGE;  Surgeon: Kennedy Bucker, MD;  Location: [facility];  Service: Orthopedics;  Laterality: Left;   KNEE CLOSED REDUCTION Left 04/15/2017   Procedure: CLOSED MANIPULATION KNEE;  Surgeon: Kennedy Bucker, MD;  Location: [facility];  Service: Orthopedics;  Laterality: Left;   LAPAROSCOPIC OVARIAN CYSTECTOMY Right    MENISCECTOMY Right    TOTAL KNEE ARTHROPLASTY Right 07/07/2016   Procedure: TOTAL KNEE ARTHROPLASTY;  Surgeon: Kennedy Bucker, MD;  Location: [facility];  Service: Orthopedics;  Laterality: Right;   TOTAL KNEE ARTHROPLASTY Left  03/16/2017   Procedure: TOTAL KNEE ARTHROPLASTY;  Surgeon: Kennedy Bucker, MD;  Location: [facility];  Service: Orthopedics;  Laterality: Left;   TUBAL LIGATION        SOCIAL HISTORY:  Social History   Tobacco Use  Smoking Status Former   Current packs/day: 0.00   Types: Cigarettes   Quit date: 03/01/2015   Years since quitting: 8.2  Smokeless Tobacco Never   Social History   Substance and Sexual Activity  Alcohol Use Yes   Alcohol/week: 0.0 standard drinks of alcohol   Comment: occasionally    Social History   Substance and Sexual Activity  Drug Use No    Family History  Problem Relation Age of Onset   Osteoarthritis Mother    Heart failure Father    Diabetes Father    Hypertension Father    Osteoarthritis Sister    Breast cancer Neg Hx    Ovarian cancer Neg Hx    Colon cancer Neg Hx     ALLERGIES:  Patient has no known allergies.  MEDS:   Current Outpatient Medications on File Prior to Visit  Medication Sig Dispense Refill   DICLOFENAC PO Take by mouth.     gentamicin (GARAMYCIN) 0.3 % ophthalmic solution Place 1 drop into both eyes every 4 (four) hours. 5 mL 0   lisinopril-hydrochlorothiazide (ZESTORETIC) 20-25 MG tablet Take 1 tablet by mouth daily.     metoprolol succinate (TOPROL-XL) 25 MG 24 hr tablet Take 25 mg by mouth daily.     naphazoline-pheniramine (NAPHCON-A) 0.025-0.3 % ophthalmic solution Place 1 drop into both eyes 4 (four) times daily as needed for eye irritation. 15 mL 0   pantoprazole (PROTONIX) 40 MG tablet Take 40 mg by mouth 2 (two) times daily as needed.      triamcinolone ointment (KENALOG) 0.5 % APPLY  OINTMENT TOPICALLY TO AFFECTED AREA AT BEDTIME 30 g 0   Vitamin D, Ergocalciferol, (DRISDOL) 50000 units CAPS capsule Take 50,000 Units by mouth every 7 (seven) days.     albuterol (VENTOLIN HFA) 108 (90 Base) MCG/ACT inhaler Inhale 2 puffs into the lungs every 4 (four) hours as needed for wheezing or shortness of breath. (Patient not  taking: Reported on 05/05/2023) 1 each 0   cyclobenzaprine (FLEXERIL) 10 MG tablet Take 1 tablet (10 mg total) by mouth 3 (three) times daily as needed for muscle spasms. (Patient not taking: Reported on 05/05/2023) 20 tablet 0   gabapentin (NEURONTIN) 100 MG capsule Take 100 mg by mouth 2 (two) times daily. (Patient not taking: Reported on 05/05/2023)     predniSONE (DELTASONE) 10 MG tablet Take 3 tablets (30 mg total) by mouth daily with breakfast. (Patient not taking: Reported on 05/05/2023) 9 tablet 0   sertraline (ZOLOFT) 50 MG tablet Take 50 mg by mouth daily.  (Patient not taking: Reported on 05/05/2023)     tiZANidine (ZANAFLEX) 2 MG tablet Take 2 mg by mouth 3 times/day as needed-between meals & bedtime.  (Patient not taking: Reported on 05/05/2023)     traMADol (ULTRAM) 50 MG tablet Take 1 tablet (50 mg total) by mouth as needed. (Patient not taking: Reported on 05/05/2023) 40 tablet 1   traZODone (DESYREL) 50 MG tablet  Take 50 mg by mouth at bedtime. (Patient not taking: Reported on 05/05/2023)     zolpidem (AMBIEN) 10 MG tablet Take 10 mg by mouth at bedtime. (Patient not taking: Reported on 05/05/2023)     Current Facility-Administered Medications on File Prior to Visit  Medication Dose Route Frequency Provider Last Rate Last Admin   triamcinolone cream (KENALOG) 0.1 %   Topical QHS Linzie Collin, MD        Meds ordered this encounter  Medications   metroNIDAZOLE (FLAGYL) 500 MG tablet    Sig: Take 1 tablet (500 mg total) by mouth 2 (two) times daily. Begin 5 days prior to scheduled surgery as directed.    Dispense:  10 tablet    Refill:  0     Physical examination BP 117/80   Pulse 67   Ht 5\' 4"  (1.626 m)   Wt 198 lb 12.8 oz (90.2 kg)   LMP 01/07/2017 (Exact Date) Comment: tah/bso  BMI 34.12 kg/m   General NAD, Conversant  HEENT Atraumatic; Op clear with mmm.  Normo-cephalic. Pupils reactive. Anicteric sclerae  Thyroid/Neck Smooth without nodularity or enlargement. Normal ROM.   Neck Supple.  Skin No rashes, lesions or ulceration. Normal palpated skin turgor. No nodularity.  Breasts: No masses or discharge.  Symmetric.  No axillary adenopathy.  Lungs: Clear to auscultation.No rales or wheezes. Normal Respiratory effort, no retractions.  Heart: NSR.  No murmurs or rubs appreciated. No peripheral edema  Abdomen: Soft.  Non-tender.  No masses.  No HSM. No hernia  Extremities: Moves all appropriately.  Normal ROM for age. No lymphadenopathy.  Neuro: Oriented to PPT.  Normal mood. Normal affect.     Pelvic:   Vulva: Normal appearance.  No lesions.  Vagina: No lesions or abnormalities noted.  Support: Second-degree cystocele second-degree rectocele  Urethra No masses tenderness or scarring.  Meatus Normal size without lesions or prolapse.  Cervix: Surgically absent  Anus: Normal exam.  No lesions.  Perineum: Normal exam.  No lesions.        Bimanual   Uterus: Surgically absent  Adnexae: No masses.  Non-tender to palpation.  Cul-de-sac: Negative for abnormality.   Assessment:   [id number] Patient Active Problem List   Diagnosis Date Noted   Chronic pain syndrome 10/16/2019   Cervicalgia 10/16/2019   Chronic neck pain (1ry area of Pain) (Bilateral) (L>R) 10/16/2019   Chronic shoulder pain (Left) 10/16/2019   Chronic upper extremity pain (Bilateral) 10/16/2019   Chronic low back pain (Bilateral) w/o sciatica 10/16/2019   Chronic lower extremity pain (Bilateral) (R>L) 10/16/2019   Chronic knee pain after total replacement (Bilateral) (L>R) 10/16/2019   Pharmacologic therapy 10/16/2019   Disorder of skeletal system 10/16/2019   Problems influencing health status 10/16/2019   DDD (degenerative disc disease), cervical 10/16/2019   DDD (degenerative disc disease), lumbosacral 10/16/2019   Abnormal MRI, cervical spine (09/29/2019) 10/16/2019   Bursitis of left shoulder 08/10/2019   Left cervical radiculopathy 08/10/2019   Bilateral arm pain 01/11/2019   Vitamin D  deficiency 05/03/2018   Nonrheumatic aortic valve insufficiency 02/28/2018   Non-rheumatic mitral regurgitation 02/28/2018   Non-rheumatic tricuspid valve insufficiency 02/28/2018   Heart palpitations 02/07/2018   Hypertension 02/07/2018   Chronic pain of wrist (Left) 10/27/2017   Myofascial muscle pain 10/27/2017   Synovitis of left knee 10/21/2017   Chest pain on breathing 05/27/2017   Dizziness 05/27/2017   Episodic lightheadedness 05/27/2017   Myalgia 05/12/2017   Primary localized osteoarthritis of left knee  03/16/2017   Surgical menopause, symptomatic 01/26/2017   Status post abdominal hysterectomy and left salpingo-oophorectomy 01/26/2017   S/P TAH (total abdominal hysterectomy) 01/20/2017   Leiomyoma uteri 01/18/2017   Primary localized osteoarthritis of right knee 07/07/2016   Abnormal hepatitis serology 04/10/2016   Polyarthralgia 04/03/2016   Bilateral anterior knee pain 04/03/2016   Anemia 10/23/2015    1. Pre-op exam   2. Stress incontinence   3. Pelvic relaxation disorder      Patient symptomatic with pelvic prolapse and urinary incontinence.  She reports that this problem was bad a year ago but has gotten significantly worse over the last year.  She would like definitive management at this time.   Plan:   Orders: Meds ordered this encounter  Medications   metroNIDAZOLE (FLAGYL) 500 MG tablet    Sig: Take 1 tablet (500 mg total) by mouth 2 (two) times daily. Begin 5 days prior to scheduled surgery as directed.    Dispense:  10 tablet    Refill:  0     1.  Anterior and posterior repair with TOT  Pre-op discussions regarding Risks and Benefits of her scheduled surgery.  Anterior Repair I have discussed the procedure of anterior repair and Kelly placation.  I have informed the patient that this procedure often corrects or improves stress urinary incontinence, but that there is certainly no guarantee of her improvement.  The procedure itself was discussed.   The possible damage to the bowel, ureters or urethra was also discussed.  We have reviewed the repositioning of the bladder that often takes place at anterior repair and I have informed her that although unlikely, it is possible that a worsening of her incontinence could occur after this procedure.  I have also discussed with her the necessity of decreased lifting and physical activity following the procedure as well as the possibility that as she gets older, her stress urinary incontinence could slowly return.  The use of vaginal Estrogen or oral Estrogen as well as other medications in the role of both stress and bladder dysynergia incontinence were discussed.  I have discussed the complication of inability to void immediately following the procedure.  The patient is aware that she may go home using a Foley catheter or may be taught the technique of self-catheterization should a complication develop.  All of her questions have been answered and I believe that she has an informed understanding of anterior repair/Kelly plication. Posterior Repair Posterior repair was discussed with the patient.  The risks were reviewed and include:  possible damage to rectum and bowel, bleeding, infection and anesthesia.  The benefits were also discussed.  Post-op recovery with special attention to hospital stay and return to sexual function were specifically reviewed.  All her questions were answered, and I believe that she has an informed understanding of Posterior repair.  TOT I have discussed the procedure of tension free vaginal tape using the trans-obturator approach. (TOT).  I have informed the patient that this procedure often corrects or improves stress urinary incontinence.  For patients without urinary incontinence-this procedure is often performed to lower the risk of iatragenic urinary incontinence at the time of cystocele repair.The patient has been made aware that there is no guarantee of her improvement or the  length of time her improvement will last.  The procedure itself was discussed in detail including possible damage to bowel, ureters, urethra and bladder.  I have informed her that the mesh is permanent.  The risk of extrusion of  the mesh has also been reviewed.  The management of this complication has been discussed.  The risks of bleeding and infection were also reviewed.  I have specifically discussed the complication of inability to void following the procedure and the patient is aware that she may go home using a Foley catheter or using a self-catheterization technique.  In addition, I have discussed with the patient the use of cystoscopy to diagnose bladder injury should there be any question of this.    Regarding the polypropylene mesh and mid-urethral slings: All of her questions were answered.  She has been advised that should she have any additional questions or concerns regarding her sling procedure we would be happy to schedule a time before her surgery to discuss them. All of the patient's questions have been answered and I believe she has an informed understanding of TOT and cystoscopy.  I spent 34 minutes involved in the care of this patient preparing to see the patient by obtaining and reviewing her medical history (including labs, imaging tests and prior procedures), documenting clinical information in the electronic health record (EHR), counseling and coordinating care plans, writing and sending prescriptions, ordering tests or procedures and in direct communicating with the patient and medical staff discussing pertinent items from her history and physical exam.  Elonda Husky, M.D. 05/18/2023 4:43 PM

## 2023-06-01 ENCOUNTER — Other Ambulatory Visit: Payer: Self-pay

## 2023-06-01 ENCOUNTER — Encounter
Admission: RE | Admit: 2023-06-01 | Discharge: 2023-06-01 | Disposition: A | Discharge status: Home / Self Care | Payer: 59 | Source: Ambulatory Visit | Attending: Obstetrics and Gynecology | Admitting: Obstetrics and Gynecology

## 2023-06-01 DIAGNOSIS — E119 Type 2 diabetes mellitus without complications: Secondary | ICD-10-CM

## 2023-06-01 DIAGNOSIS — I10 Essential (primary) hypertension: Secondary | ICD-10-CM

## 2023-06-01 HISTORY — DX: Stress incontinence (female) (male): N39.3

## 2023-06-01 HISTORY — DX: Cardiac murmur, unspecified: R01.1

## 2023-06-01 NOTE — Patient Instructions (Addendum)
Your procedure is scheduled on: 06/07/23 - Monday Report to the Registration Desk on the 1st floor of the [facility]. To find out your arrival time, please call [phone] between 1PM - 3PM on:  06/04/23 - Friday If your arrival time is 6:00 am, do not arrive before that time as the [facility] entrance doors do not open until 6:00 am.  REMEMBER: Instructions that are not followed completely may result in serious medical risk, up to and including death; or upon the discretion of your surgeon and anesthesiologist your surgery may need to be rescheduled.  Do not eat food or drink any liquids after midnight the night before surgery.  No gum chewing or hard candies.  One week prior to surgery:  Stop Anti-inflammatories (NSAIDS) such as Advil, Aleve, Ibuprofen, Motrin, Naproxen, Naprosyn and Aspirin based products such as Excedrin, Goody's Powder, BC Powder. You may however, continue to take Tylenol if needed for pain up until the day of surgery.  Stop ANY OVER THE COUNTER supplements until after surgery.  Hold metFORMIN (GLUCOPHAGE) beginning 06/05/23.  Dulaglutide (TRULICITY) hold 7 days prior to surgery- hold your Wednesday 06/03/23    TAKE ONLY THESE MEDICATIONS THE MORNING OF SURGERY WITH A SIP OF WATER:  pantoprazole (PROTONIX)- (take one the night before and one on the morning of surgery - helps to prevent nausea after surgery.) metoprolol succinate    No Alcohol for 24 hours before or after surgery.  No Smoking including e-cigarettes for 24 hours before surgery.  No chewable tobacco products for at least 6 hours before surgery.  No nicotine patches on the day of surgery.  Do not use any "recreational" drugs for at least a week (preferably 2 weeks) before your surgery.  Please be advised that the combination of cocaine and anesthesia may have negative outcomes, up to and including death. If you test positive for cocaine, your surgery will be cancelled.  On the morning  of surgery brush your teeth with toothpaste and water, you may rinse your mouth with mouthwash if you wish. Do not swallow any toothpaste or mouthwash.  Do not wear jewelry, make-up, hairpins, clips or nail polish.  For welded (permanent) jewelry: bracelets, anklets, waist bands, etc.  Please have this removed prior to surgery.  If it is not removed, there is a chance that hospital personnel will need to cut it off on the day of surgery.  Do not wear lotions, powders, or perfumes.   Do not shave body hair from the neck down 48 hours before surgery.  Contact lenses, hearing aids and dentures may not be worn into surgery.  Do not bring valuables to the hospital. [facility] is not responsible for any missing/lost belongings or valuables.   Notify your doctor if there is any change in your medical condition (cold, fever, infection).  Wear comfortable clothing (specific to your surgery type) to the hospital.  After surgery, you can help prevent lung complications by doing breathing exercises.  Take deep breaths and cough every 1-2 hours. Your doctor may order a device called an Incentive Spirometer to help you take deep breaths. When coughing or sneezing, hold a pillow firmly against your incision with both hands. This is called "splinting." Doing this helps protect your incision. It also decreases belly discomfort.  If you are being admitted to the hospital overnight, leave your suitcase in the car. After surgery it may be brought to your room.  In case of increased patient census, it may be necessary  for you, the patient, to continue your postoperative care in the Same Day Surgery department.  If you are being discharged the day of surgery, you will not be allowed to drive home. You will need a responsible individual to drive you home and stay with you for 24 hours after surgery.   If you are taking public transportation, you will need to have a responsible individual with you.  Please  call the Pre-admissions Testing Dept. at [phone] if you have any questions about these instructions.  Surgery Visitation Policy:  Patients having surgery or a procedure may have two visitors.  Children under the age of 49 must have an adult with them who is not the patient.  Inpatient Visitation:    Visiting hours are 7 a.m. to 8 p.m. Up to four visitors are allowed at one time in a patient room. The visitors may rotate out with other people during the day.  One visitor age 80 or older may stay with the patient overnight and must be in the room by 8 p.m.   Su procedimiento est programado para el: 03/10/23 - lunes [place] en el mostrador de Tax adviser del [employer] Para saber su hora de llegada, llame al [phone] entre la 1:00 p. m. y las 3:00 p. m. el: 12/09/22 - viernes Si su hora de llegada es a las 6:00 am, no llegue antes de esa hora ya que las puertas de [place] del [facility] no se abren Teacher, adult education las 6:00 am.  RECORDAR: Las instrucciones que no se siguen completamente pueden generar riesgos mdicos graves, que pueden llegar hasta la [place]; o, segn el criterio de su cirujano y Scientific laboratory technician, es posible que sea Aeronautical engineer su Leisure centre manager.  No coma ni beba lquidos despus de la medianoche del da anterior a la ciruga.  No mascar chicle ni caramelos duros.  Una semana antes de la ciruga: Detenga los antiinflamatorios (AINE) como Advil, Aleve, Ibuprofeno, Motrin, Naproxen, Naprosyn y productos a base de aspirina como Excedrin, Goody's Powder, BC Powder. Sin embargo, puede Educational psychologist tomando Tylenol si es necesario para Marketing executive de la [place].  Suspenda CUALQUIER suplemento de venta libre hasta despus de la [place].   TOME SLO ESTOS MEDICAMENTOS LA MAANA DE LA CIRUGA CON UN SORBO DE AGUA:  1. pantoprazol (PROTONIX): (tome uno la noche anterior y otro la maana de la [place]; [place] a prevenir las nuseas despus de la  [place]). 2. metroNIDAZOL (FLAGILO) No consumir alcohol durante 24 horas antes o despus de la [place].  No fumar, incluidos los cigarrillos electrnicos, durante las 24 horas previas a la [place].  No consumir productos de tabaco masticables durante al menos 6 horas antes de la [place].  Sin parches de Optometrist de la [place].  No use ningn medicamento "recreativo" durante al menos una semana (preferiblemente 2 semanas) antes de la ciruga.  Tenga en cuenta que la combinacin de cocana y anestesia puede [employer], que pueden llegar hasta la [place]. Si su prueba de cocana da positivo, su ciruga ser cancelada.  La maana de la ciruga cepille sus dientes con pasta dental y agua, puede enjuagarse la boca con enjuague bucal si lo desea. No ingiera pasta de dientes ni enjuague bucal.  Utilice jabn o toallitas CHG como se indica en la hoja de instrucciones.  No use joyas, maquillaje, horquillas, clips ni esmalte de uas.  Para joyera soldada (permanente): pulseras, tobilleras, cinturillas, etc. Retrelos antes de la  ciruga.  Si no se extrae, existe la posibilidad de que el personal del hospital deba cortrselo el da de la [place].  No use lociones, polvos ni perfumes.   No se afeite el vello corporal desde el cuello hacia abajo 48 horas antes de la [place].  No se pueden usar lentes de contacto, audfonos ni dentaduras postizas durante la [place].  No lleve objetos de valor al hospital. [facility] no es responsable de ninguna pertenencia u objeto de valor perdido o perdido.   Notifique a su mdico si hay algn cambio en su condicin mdica (resfriado, fiebre, infeccin).  Lleve ropa cmoda (especfica para su tipo de [place]) al hospital.  Despus de la ciruga, usted puede ayudar a prevenir complicaciones pulmonares haciendo ejercicios de respiracin.  Respire profundamente y tosa cada 1 o 2 horas. Su mdico puede indicarle un dispositivo llamado  espirmetro incentivador para ayudarle a respirar profundamente. Al toser o Engineering geologist, sostenga firmemente una almohada contra la incisin con ambas manos. Esto se llama "ferulizacin". Hacer esto ayuda a proteger su incisin. Tambin disminuye las molestias abdominales.  Si va a pasar la noche en el hospital, deje su maleta en el coche. Despus de la [place], es posible que lo lleven a su habitacin.  En caso de un mayor censo de [place], puede ser necesario que usted, el [place], contine con su atencin posoperatoria en el departamento de [place] el [place]. Si le dan el alta el da de la [place], no se le permitir conducir a casa. Necesitar que una persona responsable lo lleve a su casa y se quede con usted durante 24 horas despus de la [place].   Si viaja en transporte pblico, deber ir acompaado de una persona responsable.  Llame al Departamento de pruebas previas a la admisin al [phone] si tiene alguna pregunta sobre estas instrucciones.  Poltica de visitas a ciruga:  Los [employer] se someten a [place] o un procedimiento pueden [employer] visitantes.  Los nios menores de 16 aos deben estar acompaados por un adulto que no sea el [place].  Visitas para pacientes hospitalizados:    El horario de visita es de 7 a 20 horas. Se permiten hasta cuatro visitantes a la vez en la habitacin de un paciente. Los visitantes podrn rotar con Garment/textile technologist.  Un visitante de 16 aos o ms puede quedarse con el paciente durante la noche y debe estar en la habitacin antes de las 8 p. m.

## 2023-06-02 ENCOUNTER — Encounter: Payer: Self-pay | Admitting: Urgent Care

## 2023-06-02 ENCOUNTER — Encounter
Admission: RE | Admit: 2023-06-02 | Discharge: 2023-06-02 | Disposition: A | Discharge status: Home / Self Care | Payer: 59 | Source: Ambulatory Visit | Attending: Obstetrics and Gynecology | Admitting: Obstetrics and Gynecology

## 2023-06-02 DIAGNOSIS — Z01818 Encounter for other preprocedural examination: Secondary | ICD-10-CM | POA: Diagnosis present

## 2023-06-02 DIAGNOSIS — Z01812 Encounter for preprocedural laboratory examination: Secondary | ICD-10-CM | POA: Diagnosis not present

## 2023-06-02 DIAGNOSIS — Z01810 Encounter for preprocedural cardiovascular examination: Secondary | ICD-10-CM | POA: Insufficient documentation

## 2023-06-02 DIAGNOSIS — D649 Anemia, unspecified: Secondary | ICD-10-CM | POA: Diagnosis not present

## 2023-06-02 DIAGNOSIS — D259 Leiomyoma of uterus, unspecified: Secondary | ICD-10-CM

## 2023-06-02 DIAGNOSIS — I10 Essential (primary) hypertension: Secondary | ICD-10-CM | POA: Diagnosis not present

## 2023-06-02 HISTORY — DX: Other complications of anesthesia, initial encounter: T88.59XA

## 2023-06-02 HISTORY — DX: Procedure and treatment not carried out because of patient's decision for reasons of belief and group pressure: Z53.1

## 2023-06-02 HISTORY — DX: Type 2 diabetes mellitus without complications: E11.9

## 2023-06-02 LAB — CBC
HCT: 38.3 % (ref 36.0–46.0)
Hemoglobin: 13 g/dL (ref 12.0–15.0)
MCH: 27.7 pg (ref 26.0–34.0)
MCHC: 33.9 g/dL (ref 30.0–36.0)
MCV: 81.5 fL (ref 80.0–100.0)
Platelets: 261 10*3/uL (ref 150–400)
RBC: 4.7 MIL/uL (ref 3.87–5.11)
RDW: 12.2 % (ref 11.5–15.5)
WBC: 10.4 10*3/uL (ref 4.0–10.5)
nRBC: 0 % (ref 0.0–0.2)

## 2023-06-04 ENCOUNTER — Encounter: Payer: Self-pay | Admitting: Urgent Care

## 2023-06-04 NOTE — Progress Notes (Signed)
  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 In review of her medication reconciliation it was noted that patient is on a prescribed GLP-1 medication. Per guidelines issued by the American Society of Anesthesiologists (ASA), it is recommended that these medications be held for 7 days prior to the patient undergoing any type of elective surgical procedure. The patient is taking the following GLP-1 medication:  []  SEMAGLUTIDE   []  EXENATIDE  []  LIRAGLUTIDE   []  LIXISENATIDE  [x]  DULAGLUTIDE     []  TIRZEPATIDE (GLP-1/GIP)  Reached out to prescribing provider Tedd Sias, MD) to make them aware of the guidelines from anesthesia. Given that this patient takes the prescribed GLP-1 medication for her  diabetes diagnosis, rather than for weight loss, recommendations from the prescribing provider were solicited. Prescribing provider made aware of the following so that informed decision/POC can be developed for this patient that may be taking medications belonging to these drug classes:  Oral GLP-1 medications will be held 1 day prior to surgery.  Injectable GLP-1 medications will be held 7 days prior to surgery.  Metformin is routinely held 48 hours prior to surgery due to renal concerns, potential need for contrasted imaging perioperatively, and the potential for tissue hypoxia leading to drug induced lactic  acidosis.  All SGLT2i medications are held 72 hours prior to surgery as they can be associated with the increased potential for developing euglycemic diabetic ketoacidosis (EDKA).   Impression and Plan:  Caitlyn [name] is on a prescribed GLP-1 medication, which induces the known side effect of decreased gastric emptying. Efforts are bring made to mitigate the risk of perioperative hyperglycemic events, as elevated blood glucose levels have been found to contribute to intra/postoperative complications. Additionally, hyperglycemic extremes can potentially necessitate the postponing of a patient's elective case in order to better optimize perioperative glycemic control, again with the aforementioned guidelines in place. With this in mind, recommendations have been sought from the prescribing provider, who has cleared patient to proceed with holding the prescribed GLP-1 as per the guidelines from the ASA.   Provider recommending: no further recommendations received from the prescribing provider.  Copy of signed clearance and recommendations placed on patient's chart for inclusion in their medical record and for review by the surgical/anesthetic team on the day of her procedure.   Quentin Mulling, MSN, APRN, FNP-C, CEN [facility]  Perioperative Services Nurse Practitioner Phone: [phone] Fax: [phone] 06/04/23 2:14 PM  NOTE: This note has been prepared using Dragon dictation software. Despite my best ability to proofread, there is always the potential that unintentional transcriptional errors may still occur from this process.

## 2023-06-07 ENCOUNTER — Ambulatory Visit: Payer: 59 | Admitting: Urgent Care

## 2023-06-07 ENCOUNTER — Other Ambulatory Visit: Payer: Self-pay

## 2023-06-07 ENCOUNTER — Encounter: Payer: Self-pay | Admitting: Obstetrics and Gynecology

## 2023-06-07 ENCOUNTER — Ambulatory Visit
Admission: RE | Admit: 2023-06-07 | Discharge: 2023-06-07 | Disposition: A | Discharge status: Home / Self Care | Payer: 59 | Source: Ambulatory Visit | Attending: Obstetrics and Gynecology | Admitting: Obstetrics and Gynecology

## 2023-06-07 ENCOUNTER — Encounter
Admission: RE | Disposition: A | Discharge status: Home / Self Care | Payer: Self-pay | Source: Ambulatory Visit | Attending: Obstetrics and Gynecology

## 2023-06-07 DIAGNOSIS — N814 Uterovaginal prolapse, unspecified: Secondary | ICD-10-CM

## 2023-06-07 DIAGNOSIS — N993 Prolapse of vaginal vault after hysterectomy: Secondary | ICD-10-CM | POA: Diagnosis present

## 2023-06-07 DIAGNOSIS — G894 Chronic pain syndrome: Secondary | ICD-10-CM | POA: Insufficient documentation

## 2023-06-07 DIAGNOSIS — N393 Stress incontinence (female) (male): Secondary | ICD-10-CM | POA: Diagnosis not present

## 2023-06-07 DIAGNOSIS — Z7985 Long-term (current) use of injectable non-insulin antidiabetic drugs: Secondary | ICD-10-CM | POA: Diagnosis not present

## 2023-06-07 DIAGNOSIS — Z96653 Presence of artificial knee joint, bilateral: Secondary | ICD-10-CM | POA: Insufficient documentation

## 2023-06-07 DIAGNOSIS — E119 Type 2 diabetes mellitus without complications: Secondary | ICD-10-CM | POA: Diagnosis not present

## 2023-06-07 DIAGNOSIS — K219 Gastro-esophageal reflux disease without esophagitis: Secondary | ICD-10-CM | POA: Diagnosis not present

## 2023-06-07 DIAGNOSIS — Z87891 Personal history of nicotine dependence: Secondary | ICD-10-CM | POA: Insufficient documentation

## 2023-06-07 DIAGNOSIS — I10 Essential (primary) hypertension: Secondary | ICD-10-CM | POA: Insufficient documentation

## 2023-06-07 HISTORY — PX: ANTERIOR AND POSTERIOR REPAIR: SHX5121

## 2023-06-07 HISTORY — PX: BLADDER SUSPENSION: SHX72

## 2023-06-07 LAB — GLUCOSE, CAPILLARY
Glucose-Capillary: 116 mg/dL — ABNORMAL HIGH (ref 70–99)
Glucose-Capillary: 158 mg/dL — ABNORMAL HIGH (ref 70–99)
Glucose-Capillary: 159 mg/dL — ABNORMAL HIGH (ref 70–99)

## 2023-06-07 SURGERY — ANTERIOR (CYSTOCELE) AND POSTERIOR REPAIR (RECTOCELE)
Anesthesia: General

## 2023-06-07 MED ORDER — ROCURONIUM BROMIDE 10 MG/ML (PF) SYRINGE
PREFILLED_SYRINGE | INTRAVENOUS | Status: AC
  Filled 2023-06-07: qty 10

## 2023-06-07 MED ORDER — LIDOCAINE HCL (PF) 2 % IJ SOLN
INTRAMUSCULAR | Status: AC
  Filled 2023-06-07: qty 5

## 2023-06-07 MED ORDER — LACTATED RINGERS IV SOLN: INTRAVENOUS | Status: DC

## 2023-06-07 MED ORDER — OXYCODONE HCL 5 MG PO TABS
ORAL_TABLET | ORAL | Status: AC
  Filled 2023-06-07: qty 1

## 2023-06-07 MED ORDER — CEFAZOLIN SODIUM-DEXTROSE 2-4 GM/100ML-% IV SOLN
2.0000 g | INTRAVENOUS | Status: AC
  Administered 2023-06-07: 2 g via INTRAVENOUS

## 2023-06-07 MED ORDER — FENTANYL CITRATE (PF) 100 MCG/2ML IJ SOLN
INTRAMUSCULAR | Status: AC
  Filled 2023-06-07: qty 2

## 2023-06-07 MED ORDER — PROPOFOL 10 MG/ML IV BOLUS
INTRAVENOUS | Status: AC
  Filled 2023-06-07: qty 20

## 2023-06-07 MED ORDER — VASOPRESSIN 20 UNIT/ML IV SOLN
INTRAVENOUS | Status: DC | PRN
  Administered 2023-06-07: 51 mL via INTRAMUSCULAR

## 2023-06-07 MED ORDER — PHENYLEPHRINE HCL-NACL 20-0.9 MG/250ML-% IV SOLN
INTRAVENOUS | Status: AC
  Filled 2023-06-07: qty 250

## 2023-06-07 MED ORDER — 0.9 % SODIUM CHLORIDE (POUR BTL) OPTIME
TOPICAL | Status: DC | PRN
  Administered 2023-06-07: 500 mL

## 2023-06-07 MED ORDER — HYDROCODONE-ACETAMINOPHEN 5-325 MG PO TABS: 1.0000 | ORAL_TABLET | Freq: Four times a day (QID) | ORAL | 0 refills | Status: DC | PRN

## 2023-06-07 MED ORDER — LIDOCAINE-EPINEPHRINE 1 %-1:100000 IJ SOLN
INTRAMUSCULAR | Status: AC
  Filled 2023-06-07: qty 2

## 2023-06-07 MED ORDER — MIDAZOLAM HCL 2 MG/2ML IJ SOLN
INTRAMUSCULAR | Status: AC
  Filled 2023-06-07: qty 2

## 2023-06-07 MED ORDER — POVIDONE-IODINE 10 % EX SWAB: 2.0000 | Freq: Once | CUTANEOUS | Status: DC

## 2023-06-07 MED ORDER — OXYCODONE HCL 5 MG PO TABS
5.0000 mg | ORAL_TABLET | ORAL | Status: AC
  Administered 2023-06-07: 5 mg via ORAL

## 2023-06-07 MED ORDER — FENTANYL CITRATE (PF) 100 MCG/2ML IJ SOLN
INTRAMUSCULAR | Status: DC | PRN
  Administered 2023-06-07 (×2): 50 ug via INTRAVENOUS

## 2023-06-07 MED ORDER — LIDOCAINE HCL (CARDIAC) PF 100 MG/5ML IV SOSY
PREFILLED_SYRINGE | INTRAVENOUS | Status: DC | PRN
  Administered 2023-06-07: 60 mg via INTRAVENOUS

## 2023-06-07 MED ORDER — ONDANSETRON HCL 4 MG PO TABS: 4.0000 mg | ORAL_TABLET | Freq: Four times a day (QID) | ORAL | Status: DC | PRN

## 2023-06-07 MED ORDER — CHLORHEXIDINE GLUCONATE 0.12 % MT SOLN
15.0000 mL | Freq: Once | OROMUCOSAL | Status: AC
  Administered 2023-06-07: 15 mL via OROMUCOSAL

## 2023-06-07 MED ORDER — OXYCODONE-ACETAMINOPHEN 5-325 MG PO TABS: 1.0000 | ORAL_TABLET | ORAL | Status: DC | PRN

## 2023-06-07 MED ORDER — ROCURONIUM BROMIDE 100 MG/10ML IV SOLN
INTRAVENOUS | Status: DC | PRN
  Administered 2023-06-07: 50 mg via INTRAVENOUS

## 2023-06-07 MED ORDER — DEXAMETHASONE SODIUM PHOSPHATE 10 MG/ML IJ SOLN
INTRAMUSCULAR | Status: DC | PRN
  Administered 2023-06-07: 5 mg via INTRAVENOUS

## 2023-06-07 MED ORDER — ESTROGENS CONJUGATED 0.625 MG/GM VA CREA
TOPICAL_CREAM | VAGINAL | Status: AC
  Filled 2023-06-07: qty 30

## 2023-06-07 MED ORDER — OXYCODONE HCL 5 MG PO TABS
5.0000 mg | ORAL_TABLET | Freq: Once | ORAL | Status: AC
  Administered 2023-06-07: 5 mg via ORAL

## 2023-06-07 MED ORDER — PROPOFOL 10 MG/ML IV BOLUS
INTRAVENOUS | Status: DC | PRN
  Administered 2023-06-07: 170 mg via INTRAVENOUS

## 2023-06-07 MED ORDER — CHLORHEXIDINE GLUCONATE 0.12 % MT SOLN
OROMUCOSAL | Status: AC
  Filled 2023-06-07: qty 15

## 2023-06-07 MED ORDER — ORAL CARE MOUTH RINSE: 15.0000 mL | Freq: Once | OROMUCOSAL | Status: AC

## 2023-06-07 MED ORDER — ONDANSETRON HCL 4 MG/2ML IJ SOLN: 4.0000 mg | Freq: Four times a day (QID) | INTRAMUSCULAR | Status: DC | PRN

## 2023-06-07 MED ORDER — SUGAMMADEX SODIUM 200 MG/2ML IV SOLN
INTRAVENOUS | Status: DC | PRN
  Administered 2023-06-07: 200 mg via INTRAVENOUS

## 2023-06-07 MED ORDER — MIDAZOLAM HCL 2 MG/2ML IJ SOLN
INTRAMUSCULAR | Status: DC | PRN
  Administered 2023-06-07: 2 mg via INTRAVENOUS

## 2023-06-07 MED ORDER — SODIUM CHLORIDE (PF) 0.9 % IJ SOLN
INTRAMUSCULAR | Status: AC
  Filled 2023-06-07: qty 50

## 2023-06-07 MED ORDER — EPHEDRINE SULFATE-NACL 50-0.9 MG/10ML-% IV SOSY
PREFILLED_SYRINGE | INTRAVENOUS | Status: DC | PRN
Start: 2023-06-07 — End: 2023-06-07
  Administered 2023-06-07: 5 mg via INTRAVENOUS

## 2023-06-07 MED ORDER — PHENYLEPHRINE HCL-NACL 20-0.9 MG/250ML-% IV SOLN
INTRAVENOUS | Status: DC | PRN
Start: 2023-06-07 — End: 2023-06-07
  Administered 2023-06-07: 25 ug/min via INTRAVENOUS

## 2023-06-07 MED ORDER — SIMETHICONE 80 MG PO CHEW: 80.0000 mg | CHEWABLE_TABLET | Freq: Four times a day (QID) | ORAL | Status: DC | PRN

## 2023-06-07 MED ORDER — ONDANSETRON HCL 4 MG/2ML IJ SOLN: 4.0000 mg | Freq: Once | INTRAMUSCULAR | Status: DC | PRN

## 2023-06-07 MED ORDER — VASOPRESSIN 20 UNIT/ML IV SOLN
INTRAVENOUS | Status: AC
  Filled 2023-06-07: qty 1

## 2023-06-07 MED ORDER — IBUPROFEN 600 MG PO TABS
600.0000 mg | ORAL_TABLET | Freq: Four times a day (QID) | ORAL | Status: DC
  Administered 2023-06-07: 600 mg via ORAL
  Filled 2023-06-07: qty 1

## 2023-06-07 MED ORDER — CEFAZOLIN SODIUM-DEXTROSE 2-4 GM/100ML-% IV SOLN
INTRAVENOUS | Status: AC
  Filled 2023-06-07: qty 100

## 2023-06-07 MED ORDER — ONDANSETRON HCL 4 MG/2ML IJ SOLN
INTRAMUSCULAR | Status: DC | PRN
  Administered 2023-06-07: 4 mg via INTRAVENOUS

## 2023-06-07 MED ORDER — PHENYLEPHRINE 80 MCG/ML (10ML) SYRINGE FOR IV PUSH (FOR BLOOD PRESSURE SUPPORT)
PREFILLED_SYRINGE | INTRAVENOUS | Status: DC | PRN
  Administered 2023-06-07 (×3): 80 ug via INTRAVENOUS
  Administered 2023-06-07: 120 ug via INTRAVENOUS
  Administered 2023-06-07: 80 ug via INTRAVENOUS

## 2023-06-07 MED ORDER — DEXTROSE IN LACTATED RINGERS 5 % IV SOLN: INTRAVENOUS | Status: DC

## 2023-06-07 MED ORDER — FENTANYL CITRATE (PF) 100 MCG/2ML IJ SOLN
25.0000 ug | INTRAMUSCULAR | Status: DC | PRN
  Administered 2023-06-07 (×7): 25 ug via INTRAVENOUS

## 2023-06-07 SURGICAL SUPPLY — 64 items
ADH LQ OCL WTPRF AMP STRL LF (MISCELLANEOUS)
ADH SKN CLS APL DERMABOND .7 (GAUZE/BANDAGES/DRESSINGS) ×1
ADHESIVE MASTISOL STRL (MISCELLANEOUS) IMPLANT
BAG DECANTER FOR FLEXI CONT (MISCELLANEOUS) ×1 IMPLANT
BAG DRN 6X6 URO DRBLE ADPR (MISCELLANEOUS) ×1
BAG DRN RND TRDRP ANRFLXCHMBR (UROLOGICAL SUPPLIES) ×1
BAG URINE DRAIN 2000ML AR STRL (UROLOGICAL SUPPLIES) ×1 IMPLANT
BAG URINE DRAIN UROCATCH STRL (MISCELLANEOUS) ×1 IMPLANT
BLADE SURG 15 STRL LF DISP TIS (BLADE) ×1 IMPLANT
BLADE SURG 15 STRL SS (BLADE) ×1
BLADE SURG SZ10 CARB STEEL (BLADE) ×1 IMPLANT
BNDG GAUZE DERMACEA FLUFF 4 (GAUZE/BANDAGES/DRESSINGS) IMPLANT
BNDG GZE DERMACEA 4 6PLY (GAUZE/BANDAGES/DRESSINGS)
CATH FOLEY 2WAY 5CC 16FR (CATHETERS)
CATH FOLEY 2WAY SIL 16X30 (CATHETERS) ×1 IMPLANT
CATH URTH 16FR FL 2W BLN LF (CATHETERS) IMPLANT
DERMABOND ADVANCED .7 DNX12 (GAUZE/BANDAGES/DRESSINGS) ×1 IMPLANT
DRAPE PERI LITHO V/GYN (MISCELLANEOUS) ×1 IMPLANT
DRAPE UNDER BUTTOCK W/FLU (DRAPES) ×1 IMPLANT
DRAPE UTILITY 15X26 TOWEL STRL (DRAPES) ×1 IMPLANT
ELECT CAUTERY BLADE 6.4 (BLADE) ×1 IMPLANT
ELECT REM PT RETURN 9FT ADLT (ELECTROSURGICAL) ×1
ELECTRODE REM PT RTRN 9FT ADLT (ELECTROSURGICAL) ×1 IMPLANT
GAUZE 4X4 16PLY ~~LOC~~+RFID DBL (SPONGE) ×4 IMPLANT
GAUZE PACK 2X3YD (PACKING) ×1 IMPLANT
GLOVE BIO SURGEON STRL SZ 6.5 (GLOVE) ×1 IMPLANT
GLOVE BIO SURGEON STRL SZ8 (GLOVE) ×1 IMPLANT
GLOVE INDICATOR 7.0 STRL GRN (GLOVE) ×1 IMPLANT
GLOVE PI ORTHO PRO STRL 7.5 (GLOVE) ×2 IMPLANT
GOWN STRL REUS W/ TWL LRG LVL3 (GOWN DISPOSABLE) ×2 IMPLANT
GOWN STRL REUS W/ TWL XL LVL3 (GOWN DISPOSABLE) ×1 IMPLANT
GOWN STRL REUS W/TWL LRG LVL3 (GOWN DISPOSABLE) ×3
GOWN STRL REUS W/TWL XL LVL3 (GOWN DISPOSABLE) ×1
IV LACTATED RINGERS 1000ML (IV SOLUTION) IMPLANT
MANIFOLD NEPTUNE II (INSTRUMENTS) ×1 IMPLANT
NDL HYPO 22X1.5 SAFETY MO (MISCELLANEOUS) ×1 IMPLANT
NDL SAFETY ECLIP 18X1.5 (MISCELLANEOUS) ×1 IMPLANT
NDL SPNL 22GX3.5 QUINCKE BK (NEEDLE) ×1 IMPLANT
NEEDLE HYPO 22X1.5 SAFETY MO (MISCELLANEOUS) ×1 IMPLANT
NEEDLE SPNL 22GX3.5 QUINCKE BK (NEEDLE) ×1 IMPLANT
NS IRRIG 500ML POUR BTL (IV SOLUTION) ×1 IMPLANT
PACK BASIN MINOR ARMC (MISCELLANEOUS) ×1 IMPLANT
PAD OB MATERNITY 4.3X12.25 (PERSONAL CARE ITEMS) ×1 IMPLANT
PAD PREP OB/GYN DISP 24X41 (PERSONAL CARE ITEMS) ×1 IMPLANT
RETRACTOR PHONTONGUIDE ADAPT (ADAPTER) IMPLANT
SCRUB CHG 4% DYNA-HEX 4OZ (MISCELLANEOUS) ×1 IMPLANT
SET CYSTO W/LG BORE CLAMP LF (SET/KITS/TRAYS/PACK) IMPLANT
SLING TRANSOBTURATOR OBTRYX (Sling) ×1 IMPLANT
STRAP SAFETY 5IN WIDE (MISCELLANEOUS) ×1 IMPLANT
STRIP CLOSURE SKIN 1/2X4 (GAUZE/BANDAGES/DRESSINGS) ×1 IMPLANT
SURGILUBE 2OZ TUBE FLIPTOP (MISCELLANEOUS) ×1 IMPLANT
SUT VIC AB 0 CT1 27 (SUTURE) ×2
SUT VIC AB 0 CT1 27XCR 8 STRN (SUTURE) ×2 IMPLANT
SUT VIC AB 0 CT1 36 (SUTURE) ×1 IMPLANT
SUT VIC AB 2-0 CT1 (SUTURE) ×2 IMPLANT
SUT VICRYL 0 UR6 27IN ABS (SUTURE) ×2 IMPLANT
SUT VICRYL+ 3-0 36IN CT-1 (SUTURE) ×1 IMPLANT
SYR 10ML LL (SYRINGE) ×2 IMPLANT
SYR CONTROL 10ML LL (SYRINGE) ×1 IMPLANT
SYR TOOMEY IRRIG 70ML (MISCELLANEOUS) ×1
SYRINGE TOOMEY IRRIG 70ML (MISCELLANEOUS) ×1 IMPLANT
TOWEL OR 17X26 4PK STRL BLUE (TOWEL DISPOSABLE) ×1 IMPLANT
TRAP FLUID SMOKE EVACUATOR (MISCELLANEOUS) ×1 IMPLANT
WATER STERILE IRR 500ML POUR (IV SOLUTION) ×1 IMPLANT

## 2023-06-07 NOTE — Discharge Instructions (Signed)

## 2023-06-07 NOTE — Interval H&P Note (Signed)
History and Physical Interval Note:  06/07/2023 9:14 AM  Caitlyn [name]  has presented today for surgery, with the diagnosis of Cystocele Rectocele stress urinary incontinence.  The various methods of treatment have been discussed with the patient and family. After consideration of risks, benefits and other options for treatment, the patient has consented to  Procedure(s): ANTERIOR (CYSTOCELE) AND POSTERIOR REPAIR (RECTOCELE) WITH TOT (N/A) TOT (N/A) as a surgical intervention.  The patient's history has been reviewed, patient examined, no change in status, stable for surgery.  I have reviewed the patient's chart and labs.  Questions were answered to the patient's satisfaction.     Brennan Bailey

## 2023-06-07 NOTE — Op Note (Addendum)
     OPERATIVE NOTE 06/07/2023 11:31 AM  PRE-OPERATIVE DIAGNOSIS:  1) Cystocele Rectocele stress urinary incontinence  POST-OPERATIVE DIAGNOSIS:  1)  Same  OPERATION: Procedure(s) (LRB): ANTERIOR (CYSTOCELE) AND POSTERIOR REPAIR (RECTOCELE) WITH TOT (N/A) TOT (N/A)  SURGEON(S): Surgeons and Role:    Linzie Collin, MD - Primary    * Hildred Laser, MD - Assisting   ANESTHESIA: Choice  ESTIMATED BLOOD LOSS: 300 mL  SPECIMEN: * No specimens in log *  COMPLICATIONS: None  DRAINS: None  DISPOSITION: Stable to recovery room  PROCEDURE: The vaginal mucosa beginning at the vaginal cuff and overlying the bladder, was grasped with Allis clamps and injected with a dilute Pitressin solution in the midline. A midline incision was made to the level of the urethra. The vaginal mucosa was dissected laterally from the underlying attenuated fascia. A Foley catheter was placed within the bladder and the bladder was emptied. Clear urine was noted. The obturator foramina were identified in the usual manner bilaterally and marked with a marking pen the skin and subcutaneous tissues were injected with a dilute Pitressin solution. Stab incisions were made and the TOT trochars were placed through these incisions onto the operator's finger in the vagina which was retracted and bladder medially. The vaginal tape was then placed on the trochars and reversed through these incisions. A Kelly clamp was placed under the tape and the sleeves of the tape were removed. The tape was noted to be correctly positioned underneath the urethra without twists. The excess tape was removed at the level of the skin. Dermabond was applied over these small skin incisions. A typical Kelly plication was performed carefully covering the tension-free vaginal tape with thickened fascia. The bladder was plicated several sutures of 3-0 Vicryl.  A shelf of fascia was then approximated in the midline placing the bladder back in its  more anatomic position.The excess vaginal mucosa was trimmed. Vaginal mucosa was then closed in the midline with interrupted sutures to the level of the vaginal cuff. The vaginal cuff was closed with Vicryl suture. Hemostasis was noted. The posterior fourchette at approximately the hymenal ring was grasped using Allis clamps. The posterior vaginal mucosa was injected in the midline with a dilute Pitressin solution. A midline incision was made through the vaginal mucosa to the level of the vaginal cuff, and the vaginal mucosa was dissected laterally exposing the underlying attenuated fascia. Peritoneum over lying fatty tissue was identified and the enterocoele sac was tied of and approximated in a purse-string manner. Beginning at the vaginal cuff the attenuated fascia was grasped laterally and approximated in the midline thickening and tightening the fascia. These sutures were carried down to the level of the perineum. The excess vaginal mucosa was trimmed. The vagina was closed with interrupted sutures beginning at the vaginal cuff and carried down toward the perineal body. The perineal body was reinforced with multiple sutures of Vicryl. The mucosa was then closed over the perineal body in a subcuticular manner. Hemostasis was noted. Dr. Valentino Saxon provided exposure, dissection, suctioning, retraction, and general support and assistance during the procedure.   Elonda Husky, M.D. 06/07/2023 11:31 AM

## 2023-06-07 NOTE — Anesthesia Procedure Notes (Signed)
Procedure Name: Intubation Date/Time: 06/07/2023 9:56 AM  Performed by: Lanell Matar, CRNAPre-anesthesia Checklist: Patient identified, Emergency Drugs available, Suction available and Patient being monitored Patient Re-evaluated:Patient Re-evaluated prior to induction Oxygen Delivery Method: Circle System Utilized Preoxygenation: Pre-oxygenation with 100% oxygen Induction Type: IV induction Ventilation: Mask ventilation without difficulty and Oral airway inserted - appropriate to patient size Laryngoscope Size: McGraph and 4 Grade View: Grade I Tube type: Oral Tube size: 7.0 mm Number of attempts: 1 Airway Equipment and Method: Stylet and Oral airway Placement Confirmation: ETT inserted through vocal cords under direct vision, positive ETCO2 and breath sounds checked- equal and bilateral Secured at: 22 cm Tube secured with: Tape Dental Injury: Teeth and Oropharynx as per pre-operative assessment

## 2023-06-07 NOTE — Transfer of Care (Signed)
Immediate Anesthesia Transfer of Care Note  Patient: Caitlyn [name]  Procedure(s) Performed: ANTERIOR (CYSTOCELE) AND POSTERIOR REPAIR (RECTOCELE) WITH TOT TOT  Patient Location: PACU  Anesthesia Type:General  Level of Consciousness: drowsy and patient cooperative  Airway & Oxygen Therapy: Patient Spontanous Breathing and Patient connected to face mask oxygen  Post-op Assessment: Report given to RN and Post -op Vital signs reviewed and stable  Post vital signs: Reviewed and stable  Last Vitals:  Vitals Value Taken Time  BP 131/103 06/07/23 1141  Temp    Pulse 70 06/07/23 1141  Resp 12 06/07/23 1141  SpO2 100 % 06/07/23 1141  Vitals shown include unfiled device data.  Last Pain:  Vitals:   06/07/23 0748  TempSrc: Temporal  PainSc: 0-No pain         Complications: No notable events documented.

## 2023-06-07 NOTE — Anesthesia Postprocedure Evaluation (Signed)
Anesthesia Post Note  Patient: Caitlyn [name]  Procedure(s) Performed: ANTERIOR (CYSTOCELE) AND POSTERIOR REPAIR (RECTOCELE) WITH TOT TOT  Patient location during evaluation: PACU Anesthesia Type: General Level of consciousness: awake Pain management: satisfactory to patient Vital Signs Assessment: post-procedure vital signs reviewed and stable Respiratory status: spontaneous breathing and nonlabored ventilation Cardiovascular status: stable Anesthetic complications: no   No notable events documented.   Last Vitals:  Vitals:   06/07/23 0748 06/07/23 1141  BP: 133/86 (!) 131/103  Pulse: 72 70  Resp: 16 18  Temp: 36.6 C   SpO2: 98% 96%    Last Pain:  Vitals:   06/07/23 0748  TempSrc: Temporal  PainSc: 0-No pain                 VAN STAVEREN,Consetta Cosner

## 2023-06-07 NOTE — Anesthesia Preprocedure Evaluation (Signed)
Anesthesia Evaluation  Patient identified by MRN, date of birth, ID band Patient awake    Reviewed: Allergy & Precautions, NPO status , Patient's Chart, lab work & pertinent test results  Airway Mallampati: III  TM Distance: >3 FB Neck ROM: full    Dental  (+) Teeth Intact   Pulmonary neg pulmonary ROS, Patient abstained from smoking., former smoker   Pulmonary exam normal  + decreased breath sounds      Cardiovascular Exercise Tolerance: Good hypertension, Pt. on medications negative cardio ROS Normal cardiovascular exam Rhythm:Regular Rate:Normal     Neuro/Psych   Anxiety     negative neurological ROS  negative psych ROS   GI/Hepatic negative GI ROS, Neg liver ROS,GERD  Medicated,,  Endo/Other  negative endocrine ROSdiabetes, Type 2, Oral Hypoglycemic Agents  Morbid obesity  Renal/GU negative Renal ROS  negative genitourinary   Musculoskeletal   Abdominal  (+) + obese  Peds negative pediatric ROS (+)  Hematology negative hematology ROS (+)   Anesthesia Other Findings Past Medical History: No date: Anemia No date: Anemia No date: Anxiety No date: Arthritis No date: Complication of anesthesia     Comment:  a.) anesthesia awareness - has woken up several times               during surgery in the past; could not move but could hear No date: COVID-19 No date: Depression No date: Dyspepsia No date: GERD (gastroesophageal reflux disease) No date: Heart burn No date: Heart murmur No date: Hypertension No date: Irregular menstrual cycle No date: Stress incontinence No date: T2DM (type 2 diabetes mellitus) (HCC) No date: Transfusion of blood product refused for religious reason  (Jehovah's witness)  Past Surgical History: 01/18/2017: ABDOMINAL HYSTERECTOMY; Left     Comment:  Procedure: HYSTERECTOMY ABDOMINAL WITH LEFT SALPINGO               OOPHERECTOMY;  Surgeon: Herold Harms, MD;                 Location: [facility];  Service: Gynecology;  Laterality:               Left; No date: CHOLECYSTECTOMY No date: CHOLECYSTECTOMY, LAPAROSCOPIC 11/04/2017: COLONOSCOPY WITH PROPOFOL; N/A     Comment:  Procedure: COLONOSCOPY WITH PROPOFOL;  Surgeon:               Christena Deem, MD;  Location: [facility] ENDOSCOPY;                Service: Endoscopy;  Laterality: N/A; 11/04/2017: ESOPHAGOGASTRODUODENOSCOPY; N/A     Comment:  Procedure: ESOPHAGOGASTRODUODENOSCOPY (EGD);  Surgeon:               Christena Deem, MD;  Location: [facility] ENDOSCOPY;                Service: Endoscopy;  Laterality: N/A; No date: ESOPHAGOGASTRODUODENOSCOPY 05/30/2019: ESOPHAGOGASTRODUODENOSCOPY (EGD) WITH PROPOFOL; N/A     Comment:  Procedure: ESOPHAGOGASTRODUODENOSCOPY (EGD) WITH               PROPOFOL;  Surgeon: Toledo, Boykin Nearing, MD;  Location:               [facility] ENDOSCOPY;  Service: Gastroenterology;  Laterality:               N/A; No date: JOINT REPLACEMENT 12/03/2016: KNEE ARTHROSCOPY; Left     Comment:  Procedure: ARTHROSCOPY KNEE, partial medial menisectomy;  Surgeon: Kennedy Bucker, MD;  Location: [facility];  Service:              Orthopedics;  Laterality: Left; 10/21/2017: KNEE ARTHROTOMY; Left     Comment:  Procedure: KNEE ARTHROTOMY LEFT LATERAL RELEASE MEDIAL               RETINACULART REPAIR POLY EXCHANGE;  Surgeon: Kennedy Bucker, MD;  Location: [facility];  Service: Orthopedics;               Laterality: Left; 04/15/2017: KNEE CLOSED REDUCTION; Left     Comment:  Procedure: CLOSED MANIPULATION KNEE;  Surgeon: Kennedy Bucker, MD;  Location: [facility];  Service: Orthopedics;               Laterality: Left; No date: LAPAROSCOPIC OVARIAN CYSTECTOMY; Right No date: MENISCECTOMY; Right 07/07/2016: TOTAL KNEE ARTHROPLASTY; Right     Comment:  Procedure: TOTAL KNEE ARTHROPLASTY;  Surgeon: Kennedy Bucker, MD;  Location: [facility];  Service: Orthopedics;                 Laterality: Right; 03/16/2017: TOTAL KNEE ARTHROPLASTY; Left     Comment:  Procedure: TOTAL KNEE ARTHROPLASTY;  Surgeon: Kennedy Bucker, MD;  Location: [facility];  Service: Orthopedics;               Laterality: Left; No date: TUBAL LIGATION     Reproductive/Obstetrics negative OB ROS                             Anesthesia Physical Anesthesia Plan  ASA: 3  Anesthesia Plan: General   Post-op Pain Management:    Induction: Intravenous  PONV Risk Score and Plan: Ondansetron, Dexamethasone, Midazolam and Treatment may vary due to age or medical condition  Airway Management Planned: Oral ETT  Additional Equipment:   Intra-op Plan:   Post-operative Plan: Extubation in OR  Informed Consent: I have reviewed the patients History and Physical, chart, labs and discussed the procedure including the risks, benefits and alternatives for the proposed anesthesia with the patient or authorized representative who has indicated his/her understanding and acceptance.     Dental Advisory Given  Plan Discussed with: CRNA and Surgeon  Anesthesia Plan Comments:        Anesthesia Quick Evaluation

## 2023-06-08 ENCOUNTER — Encounter: Payer: Self-pay | Admitting: Obstetrics and Gynecology

## 2023-06-11 ENCOUNTER — Ambulatory Visit: Payer: 59

## 2023-06-11 VITALS — BP 131/86 | HR 79 | Wt 194.8 lb

## 2023-06-11 DIAGNOSIS — Z466 Encounter for fitting and adjustment of urinary device: Secondary | ICD-10-CM

## 2023-06-11 DIAGNOSIS — N9489 Other specified conditions associated with female genital organs and menstrual cycle: Secondary | ICD-10-CM

## 2023-06-11 MED ORDER — NITROFURANTOIN MONOHYD MACRO 100 MG PO CAPS
100.0000 mg | ORAL_CAPSULE | Freq: Two times a day (BID) | ORAL | 1 refills | Status: DC
Start: 2023-06-11 — End: 2023-07-21

## 2023-06-11 NOTE — Progress Notes (Signed)
GYN ENCOUNTER  Encounter for Post-op follow up/urine catheter removal  Subjective  HPI: Caitlyn [name] is a 57 y.o. [id number] who presents today for removal of urinary catheter s/p TOT surgery on 06/07/2023.   She is having pain and itching post-op. States that medication she was prescribed has been effective in alleviating pain.   Past Medical History:  Diagnosis Date   Anemia    Anemia    Anxiety    Arthritis    Complication of anesthesia    a.) anesthesia awareness - has woken up several times during surgery in the past; could not move but could hear   COVID-19    Depression    Dyspepsia    GERD (gastroesophageal reflux disease)    Heart burn    Heart murmur    Hypertension    Irregular menstrual cycle    Stress incontinence    T2DM (type 2 diabetes mellitus) (HCC)    Transfusion of blood product refused for religious reason (Jehovah's witness)    Past Surgical History:  Procedure Laterality Date   ABDOMINAL HYSTERECTOMY Left 01/18/2017   Procedure: HYSTERECTOMY ABDOMINAL WITH LEFT SALPINGO OOPHERECTOMY;  Surgeon: Herold Harms, MD;  Location: [facility];  Service: Gynecology;  Laterality: Left;   ANTERIOR AND POSTERIOR REPAIR N/A 06/07/2023   Procedure: ANTERIOR (CYSTOCELE) AND POSTERIOR REPAIR (RECTOCELE) WITH TOT;  Surgeon: Linzie Collin, MD;  Location: [facility];  Service: Gynecology;  Laterality: N/A;   BLADDER SUSPENSION N/A 06/07/2023   Procedure: TOT;  Surgeon: Linzie Collin, MD;  Location: [facility];  Service: Gynecology;  Laterality: N/A;   CHOLECYSTECTOMY     CHOLECYSTECTOMY, LAPAROSCOPIC     COLONOSCOPY WITH PROPOFOL N/A 11/04/2017   Procedure: COLONOSCOPY WITH PROPOFOL;  Surgeon: Christena Deem, MD;  Location: [facility] ENDOSCOPY;  Service: Endoscopy;  Laterality: N/A;   ESOPHAGOGASTRODUODENOSCOPY N/A 11/04/2017   Procedure: ESOPHAGOGASTRODUODENOSCOPY (EGD);  Surgeon: Christena Deem, MD;  Location: [facility] ENDOSCOPY;  Service: Endoscopy;   Laterality: N/A;   ESOPHAGOGASTRODUODENOSCOPY     ESOPHAGOGASTRODUODENOSCOPY (EGD) WITH PROPOFOL N/A 05/30/2019   Procedure: ESOPHAGOGASTRODUODENOSCOPY (EGD) WITH PROPOFOL;  Surgeon: Toledo, Boykin Nearing, MD;  Location: [facility] ENDOSCOPY;  Service: Gastroenterology;  Laterality: N/A;   JOINT REPLACEMENT     KNEE ARTHROSCOPY Left 12/03/2016   Procedure: ARTHROSCOPY KNEE, partial medial menisectomy;  Surgeon: Kennedy Bucker, MD;  Location: [facility];  Service: Orthopedics;  Laterality: Left;   KNEE ARTHROTOMY Left 10/21/2017   Procedure: KNEE ARTHROTOMY LEFT LATERAL RELEASE MEDIAL RETINACULART REPAIR POLY EXCHANGE;  Surgeon: Kennedy Bucker, MD;  Location: [facility];  Service: Orthopedics;  Laterality: Left;   KNEE CLOSED REDUCTION Left 04/15/2017   Procedure: CLOSED MANIPULATION KNEE;  Surgeon: Kennedy Bucker, MD;  Location: [facility];  Service: Orthopedics;  Laterality: Left;   LAPAROSCOPIC OVARIAN CYSTECTOMY Right    MENISCECTOMY Right    TOTAL KNEE ARTHROPLASTY Right 07/07/2016   Procedure: TOTAL KNEE ARTHROPLASTY;  Surgeon: Kennedy Bucker, MD;  Location: [facility];  Service: Orthopedics;  Laterality: Right;   TOTAL KNEE ARTHROPLASTY Left 03/16/2017   Procedure: TOTAL KNEE ARTHROPLASTY;  Surgeon: Kennedy Bucker, MD;  Location: [facility];  Service: Orthopedics;  Laterality: Left;   TUBAL LIGATION     OB History     Gravida  4   Para  3   Term  3   Preterm      AB  1   Living  3      SAB  1   IAB  Ectopic      Multiple      Live Births  3          No Known Allergies  Review of Systems  12 point ROS negative except for pertinent positives noted in HPO above.   Objective  BP 131/86   Pulse 79   Wt 194 lb 12.8 oz (88.4 kg)   LMP 01/07/2017 (Exact Date) Comment: tah/bso  BMI 33.44 kg/m   Physical examination GENERAL APPEARANCE: alert, well appearing LUNGS: normal work of breathing HEART: normal heart rate  Procedure Procedure explained to patient via interpreter. Patient  clamped urinary catheter prior to coming to clinic at around 11:30 this morning. Catheter balloon deflated and catheter removed. Patient voided approximately 40 mL of urine, had continued urge to urinate but was unable to void any additional urine. After void, approximately 150 mL urine obtained via urinary catheter. Reviewed with patient plan to leave urinary catheter in place until next appointment with Dr. Logan Bores on 10/16.  Macrobid prescription provided for prophylaxis given continued catheter use.    [name], CNM  06/11/23 1:38 PM

## 2023-06-16 ENCOUNTER — Encounter: Payer: Self-pay | Admitting: Obstetrics and Gynecology

## 2023-06-16 ENCOUNTER — Ambulatory Visit: Payer: 59 | Admitting: Obstetrics and Gynecology

## 2023-06-16 VITALS — BP 155/94 | HR 80 | Ht 64.0 in | Wt 194.0 lb

## 2023-06-16 DIAGNOSIS — Z4889 Encounter for other specified surgical aftercare: Secondary | ICD-10-CM | POA: Diagnosis not present

## 2023-06-16 DIAGNOSIS — Z9889 Other specified postprocedural states: Secondary | ICD-10-CM

## 2023-06-16 DIAGNOSIS — R339 Retention of urine, unspecified: Secondary | ICD-10-CM

## 2023-06-16 NOTE — Progress Notes (Signed)
HPI:      Caitlyn [name] is a 57 y.o. [phone] who LMP was Patient's last menstrual period was 01/07/2017 (exact date).  Subjective:   She presents today 1 week from AMP repair with TOT.  She reports that she is not having much pain has very little bleeding.  She attempted voiding last Friday without success.  She has kept a catheter through the weekend.  She presented today after clamping it late this morning.    Hx: The following portions of the patient's history were reviewed and updated as appropriate:             She  has a past medical history of Anemia, Anemia, Anxiety, Arthritis, Complication of anesthesia, COVID-19, Depression, Dyspepsia, GERD (gastroesophageal reflux disease), Heart burn, Heart murmur, Hypertension, Irregular menstrual cycle, Stress incontinence, T2DM (type 2 diabetes mellitus) (HCC), and Transfusion of blood product refused for religious reason (Jehovah's witness). She does not have any pertinent problems on file. She  has a past surgical history that includes Cholecystectomy, laparoscopic; Laparoscopic ovarian cystectomy (Right); Tubal ligation; Meniscectomy (Right); Total knee arthroplasty (Right, 07/07/2016); Cholecystectomy; Joint replacement; Knee arthroscopy (Left, 12/03/2016); Abdominal hysterectomy (Left, 01/18/2017); Total knee arthroplasty (Left, 03/16/2017); Knee Closed Reduction (Left, 04/15/2017); Knee arthrotomy (Left, 10/21/2017); Esophagogastroduodenoscopy (N/A, 11/04/2017); Colonoscopy with propofol (N/A, 11/04/2017); Esophagogastroduodenoscopy; Esophagogastroduodenoscopy (egd) with propofol (N/A, 05/30/2019); Anterior and posterior repair (N/A, 06/07/2023); and Bladder suspension (N/A, 06/07/2023). Her family history includes Diabetes in her father; Heart failure in her father; Hypertension in her father; Osteoarthritis in her mother and sister. She  reports that she quit smoking about 8 years ago. Her smoking use included cigarettes. She has never used  smokeless tobacco. She reports current alcohol use. She reports that she does not use drugs. She has a current medication list which includes the following prescription(s): atorvastatin, trulicity, ipratropium-albuterol, lisinopril-hydrochlorothiazide, metformin, metoprolol succinate, naphazoline-pheniramine, nitrofurantoin (macrocrystal-monohydrate), pantoprazole, vitamin d (ergocalciferol), and hydrocodone-acetaminophen. She has No Known Allergies.       Review of Systems:  Review of Systems  Constitutional: Denied constitutional symptoms, night sweats, recent illness, fatigue, fever, insomnia and weight loss.  Eyes: Denied eye symptoms, eye pain, photophobia, vision change and visual disturbance.  Ears/Nose/Throat/Neck: Denied ear, nose, throat or neck symptoms, hearing loss, nasal discharge, sinus congestion and sore throat.  Cardiovascular: Denied cardiovascular symptoms, arrhythmia, chest pain/pressure, edema, exercise intolerance, orthopnea and palpitations.  Respiratory: Denied pulmonary symptoms, asthma, pleuritic pain, productive sputum, cough, dyspnea and wheezing.  Gastrointestinal: Denied, gastro-esophageal reflux, melena, nausea and vomiting.  Genitourinary: Denied genitourinary symptoms including symptomatic vaginal discharge, pelvic relaxation issues, and urinary complaints.  Musculoskeletal: Denied musculoskeletal symptoms, stiffness, swelling, muscle weakness and myalgia.  Dermatologic: Denied dermatology symptoms, rash and scar.  Neurologic: Denied neurology symptoms, dizziness, headache, neck pain and syncope.  Psychiatric: Denied psychiatric symptoms, anxiety and depression.  Endocrine: Denied endocrine symptoms including hot flashes and night sweats.   Meds:   Current Outpatient Medications on File Prior to Visit  Medication Sig Dispense Refill   atorvastatin (LIPITOR) 10 MG tablet Take 10 mg by mouth daily.     Dulaglutide (TRULICITY) 1.5 MG/0.5ML SOPN Inject into the  skin once a week. Takes every Wednesday     Ipratropium-Albuterol (COMBIVENT) 20-100 MCG/ACT AERS respimat Inhale 1 puff into the lungs every 6 (six) hours as needed.     lisinopril-hydrochlorothiazide (ZESTORETIC) 20-25 MG tablet Take 1 tablet by mouth daily.     metFORMIN (GLUCOPHAGE) 1000 MG tablet Take 1,000 mg by mouth daily with breakfast.  metoprolol succinate (TOPROL-XL) 25 MG 24 hr tablet Take 25 mg by mouth daily.     naphazoline-pheniramine (NAPHCON-A) 0.025-0.3 % ophthalmic solution Place 1 drop into both eyes 4 (four) times daily as needed for eye irritation. 15 mL 0   nitrofurantoin, macrocrystal-monohydrate, (MACROBID) 100 MG capsule Take 1 capsule (100 mg total) by mouth 2 (two) times daily. 14 capsule 1   pantoprazole (PROTONIX) 40 MG tablet Take 40 mg by mouth 2 (two) times daily as needed.      Vitamin D, Ergocalciferol, (DRISDOL) 50000 units CAPS capsule Take 50,000 Units by mouth every 7 (seven) days.     HYDROcodone-acetaminophen (NORCO/VICODIN) 5-325 MG tablet Take 1-2 tablets by mouth every 6 (six) hours as needed for moderate pain. (Patient not taking: Reported on 06/16/2023) 30 tablet 0   No current facility-administered medications on file prior to visit.      Objective:     Vitals:   06/16/23 1345 06/16/23 1408  BP: (!) 174/85 (!) 155/94  Pulse: 80    Filed Weights   06/16/23 1345  Weight: 194 lb (88 kg)              Catheter removed and patient attempted voiding   Voided amount = 225    Residual = 25          Assessment:    [id number] Patient Active Problem List   Diagnosis Date Noted   Chronic pain syndrome 10/16/2019   Cervicalgia 10/16/2019   Chronic neck pain (1ry area of Pain) (Bilateral) (L>R) 10/16/2019   Chronic shoulder pain (Left) 10/16/2019   Chronic upper extremity pain (Bilateral) 10/16/2019   Chronic low back pain (Bilateral) w/o sciatica 10/16/2019   Chronic lower extremity pain (Bilateral) (R>L) 10/16/2019   Chronic knee pain  after total replacement (Bilateral) (L>R) 10/16/2019   Pharmacologic therapy 10/16/2019   Disorder of skeletal system 10/16/2019   Problems influencing health status 10/16/2019   DDD (degenerative disc disease), cervical 10/16/2019   DDD (degenerative disc disease), lumbosacral 10/16/2019   Abnormal MRI, cervical spine (09/29/2019) 10/16/2019   Bursitis of left shoulder 08/10/2019   Left cervical radiculopathy 08/10/2019   Bilateral arm pain 01/11/2019   Vitamin D deficiency 05/03/2018   Nonrheumatic aortic valve insufficiency 02/28/2018   Non-rheumatic mitral regurgitation 02/28/2018   Non-rheumatic tricuspid valve insufficiency 02/28/2018   Heart palpitations 02/07/2018   Hypertension 02/07/2018   Chronic pain of wrist (Left) 10/27/2017   Myofascial muscle pain 10/27/2017   Synovitis of left knee 10/21/2017   Chest pain on breathing 05/27/2017   Dizziness 05/27/2017   Episodic lightheadedness 05/27/2017   Myalgia 05/12/2017   Primary localized osteoarthritis of left knee 03/16/2017   Surgical menopause, symptomatic 01/26/2017   Status post abdominal hysterectomy and left salpingo-oophorectomy 01/26/2017   S/P TAH (total abdominal hysterectomy) 01/20/2017   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 Postoperative state   2. Urinary retention     Patient doing well.  Now voiding and emptying effectively.   Plan:            1.  To complete antibiotic course.  Emptying her bladder every 2 hours over the next 5 days.  Plan follow-up in 4 to 5 weeks. Orders No orders of the defined types were placed in this encounter.   No orders of the  defined types were placed in this encounter.     F/U  Return in about 5 weeks (around 07/21/2023).  Elonda Husky, M.D. 06/16/2023 2:43 PM

## 2023-06-16 NOTE — Progress Notes (Signed)
Patient presents for 1 week postop follow-up following A/P repair with TOT. She states some discomfort from still having the catheter, very hopeful it can be removed today.

## 2023-07-07 NOTE — Progress Notes (Signed)
Psychiatric Initial Adult Assessment   Patient Identification: Rene Paige MRN:  [id number] Date of Evaluation:  07/12/2023 Referral Source: Preston Fleeting*  Chief Complaint:   Chief Complaint  Patient presents with   Establish Care   Visit Diagnosis:    ICD-10-CM   1. MDD (major depressive disorder), recurrent episode, moderate (HCC)  F33.1 TSH    2. GAD (generalized anxiety disorder)  F41.1 TSH    3. 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 Insomnia, unspecified type  G47.00 Ambulatory referral to Pulmonology      History of Present Illness:   Yuriko Orzech is a 57 y.o. year old female with a history of depression, diabetes, hypertension, s/p cholecystectomy, hysterectomy, who is referred for depression.   The interview is done with Spanish interpreter.  She states that her biggest problem is insomnia.  She has this for 16 years.  She has initial and middle insomnia. She sleeps up to 2-3 hours per day. She does not like nights as she is concerned about insomnia. She snores at night.  Although she underwent a sleep evaluation a few years ago and was recommended to have a follow-up evaluation, she was unable to complete the follow-up study despite her attempts to contact the practice.  She has emotional challenges since the loss of her son 3 years ago.  He was confined when he was in [place] and she does not know what happened.  She tearfully describes that she was very close to him.  He used to call her every day.  Although she has good relationship with her other children and her husband, it has been difficult at times due to her missing her son.  She has significant difficulty in doing things due to feeling exhausted.  Her physical does not catch up.  Although her husband remodeled spacious place for her and decoration, she does not have any energy to do anything.  Although she used to do cooking, she does not have the energy for the day either.  She feels  that others needs to push her instead of her naturally doing things.   The patient has mood symptoms as in PHQ-9/GAD-7. She denies change in appetite. She denies SI. She tends to feel anxious, overwhelmed.  She shared an example of her husband inviting her to watch a movie, which triggered intense anxiety. She described feeling as though everything was overwhelming and came together all at once. She had a panic attack a few times without known triggers.   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  She reports beautiful childhood.  Her father was absent, and her mother raised her. She moved to [facility] in 2015 as she wanted a change, and has gotten married with her current husband.  She does not think she can live there anymore due to the heat. She enjoys tranquility and peace here. However, she misses fruits and the beach.   Substance use  Tobacco Alcohol Other substances/  Current  A beer at times denies  Past  denies denies  Past Treatment  Associated Signs/Symptoms: Depression Symptoms:  depressed mood, anhedonia, insomnia, feelings of worthlessness/guilt, anxiety, (Hypo) Manic Symptoms:   denies decreased need for sleep, euphoria Anxiety Symptoms:  Panic Symptoms, Psychotic Symptoms:   denies AH, VH, paranoia PTSD Symptoms: Negative  Past Psychiatric History:  Outpatient: psychotherapy, at [place] [facility] admission: denies Previous suicide attempt: denies Past trials of medication: trazodone, Ambien History of violence:  History of head injury:   Previous Psychotropic Medications: No    Substance Abuse History in the last 12 months:  No.  Consequences of Substance Abuse: NA  Past Medical History:  Past Medical History:  Diagnosis Date   Anemia    Anemia    Anxiety    Arthritis    Complication of anesthesia    a.) anesthesia awareness - has woken up several times during surgery in the past; could not move but could hear   COVID-19    Depression    Dyspepsia    GERD (gastroesophageal reflux disease)    Heart burn    Heart murmur    Hypertension    Irregular menstrual cycle    Stress incontinence    T2DM (type 2 diabetes mellitus) (HCC)    Transfusion of blood product refused for religious reason (Jehovah's witness)     Past Surgical History:  Procedure Laterality Date   ABDOMINAL HYSTERECTOMY Left 01/18/2017   Procedure: HYSTERECTOMY ABDOMINAL WITH LEFT SALPINGO OOPHERECTOMY;  Surgeon: Herold Harms, MD;  Location: [facility];  Service: Gynecology;  Laterality: Left;   ANTERIOR AND POSTERIOR REPAIR N/A 06/07/2023   Procedure: ANTERIOR (CYSTOCELE) AND POSTERIOR REPAIR (RECTOCELE) WITH TOT;  Surgeon: Linzie Collin, MD;  Location: [facility];  Service: Gynecology;  Laterality: N/A;   BLADDER SUSPENSION N/A 06/07/2023   Procedure: TOT;  Surgeon: Linzie Collin, MD;  Location: [facility];  Service: Gynecology;  Laterality: N/A;   CHOLECYSTECTOMY     CHOLECYSTECTOMY, LAPAROSCOPIC     COLONOSCOPY WITH PROPOFOL N/A 11/04/2017   Procedure: COLONOSCOPY WITH PROPOFOL;  Surgeon: Christena Deem, MD;  Location: [facility] ENDOSCOPY;  Service: Endoscopy;  Laterality: N/A;   ESOPHAGOGASTRODUODENOSCOPY N/A 11/04/2017   Procedure: ESOPHAGOGASTRODUODENOSCOPY (EGD);  Surgeon: Christena Deem, MD;  Location: [facility] ENDOSCOPY;  Service: Endoscopy;  Laterality: N/A;   ESOPHAGOGASTRODUODENOSCOPY     ESOPHAGOGASTRODUODENOSCOPY (EGD) WITH PROPOFOL N/A 05/30/2019   Procedure: ESOPHAGOGASTRODUODENOSCOPY (EGD) WITH PROPOFOL;  Surgeon: Toledo, Boykin Nearing, MD;  Location: [facility]  ENDOSCOPY;  Service: Gastroenterology;  Laterality: N/A;   JOINT REPLACEMENT     KNEE ARTHROSCOPY Left 12/03/2016   Procedure: ARTHROSCOPY KNEE, partial medial menisectomy;  Surgeon: Kennedy Bucker, MD;  Location: [facility];  Service: Orthopedics;  Laterality: Left;   KNEE ARTHROTOMY Left 10/21/2017   Procedure: KNEE ARTHROTOMY LEFT LATERAL RELEASE MEDIAL RETINACULART REPAIR POLY EXCHANGE;  Surgeon: Kennedy Bucker, MD;  Location: [facility];  Service: Orthopedics;  Laterality: Left;   KNEE CLOSED REDUCTION Left 04/15/2017   Procedure: CLOSED MANIPULATION KNEE;  Surgeon: Kennedy Bucker, MD;  Location: [facility];  Service: Orthopedics;  Laterality: Left;   LAPAROSCOPIC OVARIAN CYSTECTOMY Right    MENISCECTOMY Right    TOTAL KNEE ARTHROPLASTY Right 07/07/2016   Procedure: TOTAL KNEE ARTHROPLASTY;  Surgeon: Kennedy Bucker, MD;  Location: [facility];  Service: Orthopedics;  Laterality: Right;   TOTAL KNEE ARTHROPLASTY Left 03/16/2017   Procedure: TOTAL KNEE ARTHROPLASTY;  Surgeon: Kennedy Bucker, MD;  Location: [facility];  Service: Orthopedics;  Laterality: Left;   TUBAL LIGATION      Family Psychiatric History: as below  Family History:  Family History  Problem Relation Age of Onset   Osteoarthritis Mother    Heart failure Father    Diabetes Father    Hypertension Father    Osteoarthritis Sister    Breast cancer Neg Hx    Ovarian cancer Neg Hx    Colon cancer Neg Hx     Social History:   Social History   Socioeconomic History   Marital status: Married    Spouse name: Delton See   Number of children: Not on file   Years of education: Not on file   Highest education level: Not on file  Occupational History   Not on file  Tobacco Use   Smoking status: Former    Current packs/day: 0.00    Types: Cigarettes    Quit date: 03/01/2015    Years since quitting: 8.3   Smokeless tobacco: Never  Vaping Use   Vaping status: Never Used  Substance and Sexual Activity   Alcohol use: Yes    Comment: rarely    Drug use: No   Sexual activity: Not Currently    Partners: Male    Birth control/protection: Surgical    Comment: hysterectomy  Other Topics Concern   Not on file  Social History Narrative   Not on file   Social Determinants of Health   Financial Resource Strain: Not on file  Food Insecurity: Not on file  Transportation Needs: Not on file  Physical Activity: Not on file  Stress: Not on file  Social Connections: Not on file    Additional Social History: as above  Allergies:  No Known Allergies  Metabolic Disorder Labs: No results found for: "HGBA1C", "MPG" No results found for: "PROLACTIN" No results found for: "CHOL", "TRIG", "HDL", "CHOLHDL", "VLDL", "LDLCALC" No results found for: "TSH"  Therapeutic Level Labs: No results found for: "LITHIUM" No results found for: "CBMZ" No results found for: "VALPROATE"  Current Medications: Current Outpatient Medications  Medication Sig Dispense Refill   escitalopram (LEXAPRO) 10 MG tablet Take 1 tablet (10 mg total) by mouth daily. 30 tablet 1   traZODone (DESYREL) 50 MG tablet Take 0.5-1 tablets (25-50 mg total) by mouth at bedtime as needed for sleep. 30 tablet 1   atorvastatin (LIPITOR) 10 MG tablet Take 10 mg by mouth daily.     Dulaglutide (TRULICITY) 1.5 MG/0.5ML SOPN Inject into the skin once a week. Takes every Wednesday     HYDROcodone-acetaminophen (NORCO/VICODIN) 5-325 MG tablet Take 1-2 tablets by mouth every 6 (six) hours as needed for moderate pain. (Patient not taking: Reported on 06/16/2023) 30 tablet 0   Ipratropium-Albuterol (COMBIVENT) 20-100 MCG/ACT AERS respimat Inhale 1 puff into the lungs every 6 (six) hours as needed.     lisinopril-hydrochlorothiazide (ZESTORETIC) 20-25 MG tablet Take 1 tablet by mouth daily.     metFORMIN (GLUCOPHAGE) 1000 MG tablet Take 1,000 mg by mouth daily with breakfast.     metoprolol succinate (TOPROL-XL) 25 MG 24 hr tablet Take 25 mg by mouth daily.     naphazoline-pheniramine  (NAPHCON-A) 0.025-0.3 % ophthalmic solution Place 1 drop into both eyes 4 (four) times daily as needed for eye irritation. 15 mL 0   nitrofurantoin, macrocrystal-monohydrate, (MACROBID) 100 MG capsule Take 1 capsule (100 mg total) by mouth 2 (two) times daily. 14 capsule 1   pantoprazole (PROTONIX) 40 MG tablet Take 40 mg by mouth 2 (two) times daily as needed.      Vitamin D, Ergocalciferol, (DRISDOL) 50000 units CAPS capsule Take 50,000  Units by mouth every 7 (seven) days.     No current facility-administered medications for this visit.    Musculoskeletal: Strength & Muscle Tone: within normal limits Gait & Station: normal Patient leans: N/A  Psychiatric Specialty Exam: Review of Systems  Psychiatric/Behavioral:  Positive for decreased concentration, dysphoric mood and sleep disturbance. Negative for agitation, behavioral problems, confusion, hallucinations, self-injury and suicidal ideas. The patient is nervous/anxious. The patient is not hyperactive.   All other systems reviewed and are negative.   Blood pressure (!) 154/93, pulse 74, temperature (!) 97.5 F (36.4 C), temperature source Skin, height 5\' 4"  (1.626 m), weight 198 lb 9.6 oz (90.1 kg), last menstrual period 01/07/2017.Body mass index is 34.09 kg/m.  General Appearance: Well Groomed  Eye Contact:  Good  Speech:  Clear and Coherent  Volume:  Normal  Mood:  Anxious and Depressed  Affect:  Appropriate, Congruent, and Tearful  Thought Process:  Coherent  Orientation:  Full (Time, Place, and Person)  Thought Content:  Logical  Suicidal Thoughts:  No  Homicidal Thoughts:  No  Memory:  Immediate;   Good  Judgement:  Good  Insight:  Good  Psychomotor Activity:  Normal  Concentration:  Concentration: Good and Attention Span: Good  Recall:  Good  Fund of Knowledge:Good  Language: Good  Akathisia:  No  Handed:  Right  AIMS (if indicated):  not done  Assets:  Communication Skills Desire for Improvement  ADL's:  Intact   Cognition: WNL  Sleep:  Poor   Screenings: GAD-7    Flowsheet Row [facility] Visit [facility]12/2023 in [facility]  Total GAD-7 Score 19      PHQ2-9    Flowsheet Row Office Visit from 07/12/2023 in [facility]  PHQ-2 Total Score 5  PHQ-9 Total Score 16      Flowsheet Row Admission (Discharged) from 06/07/2023 in [facility] from 10/18/2022 in [facility] at [facility] ED from 10/16/2022 in [facility] at [facility]  C-SSRS RISK CATEGORY No Risk No Risk No Risk       Assessment and Plan:  Sheza Westrich is a 57 y.o. year old female with a history of depression, diabetes, hypertension, who is referred for depression.   1. MDD (major depressive disorder), recurrent episode, moderate (HCC) 2. GAD (generalized anxiety disorder) 3. Prolonged grief disorder Acute stressors include:  Other stressors include:  loss of her son in his 17's after being confined in [place]   History: depression, anxiety since loss of her son   The exam is notable for tearfulness, and she reports depressive symptoms and anxiety in the context of the loss of her son.  Will start Lexapro to target depression and anxiety.  Discussed potential GI side effects (recently underwent cystocele Rectocele for stress urinary incontinence), and she agrees to contact the office if she experiences any concerning side effects.  Noted that although she will benefit from CBT, she reports limited benefit; will continue to discuss as needed.  Will obtain lab to rule out medical health issues contributing to this.   4. Insomnia, unspecified type She has chronic initial and middle insomnia for many years. She has history of snoring, fatigue.  She reportedly could not have a follow-up after having a sleep evaluation a few years ago.  She is willing  to try another practice to pursue the evaluation. Given she has strong preference to start hypnotics,  will try trazodone at this time to target insomnia.  Noted that although she reports good benefit from Ambien, ordered this medication at this time due to risk of dependence. She has perpetuating factors of cognitive distortion, and she will greatly benefit from CBT in the future; will discuss this at the next visit.   Plan Start lexapro 5 mg daily for one week, then 10 mg daily  Start trazodone 25-50 mg at night as needed for insomnia Referral for evaluation of sleep apnea  Next appointment: 1/9 at 1:30 Obtain lab (TSH) at [facility]  The patient demonstrates the following risk factors for suicide: Chronic risk factors for suicide include: psychiatric disorder of depression, anxiety . Acute risk factors for suicide include: loss (financial, interpersonal, professional). Protective factors for this patient include: positive social support, responsibility to others (children, family), coping skills, and hope for the future. Considering these factors, the overall suicide risk at this point appears to be low. Patient is appropriate for outpatient follow up.   Collaboration of Care: Other reviewed notes in Epic  Patient/Guardian was advised Release of Information must be obtained prior to any record release in order to collaborate their care with an outside provider. Patient/Guardian was advised if they have not already done so to contact the registration department to sign all necessary forms in order for [place] to release information regarding their care.   Consent: Patient/Guardian gives verbal consent for treatment and assignment of benefits for services provided during this visit. Patient/Guardian expressed understanding and agreed to proceed.   The duration of the time spent on the following activities on the date of the encounter was 60 minutes.   Preparing to see the patient (e.g., review of test,  records)  Obtaining and/or reviewing separately obtained history  Performing a medically necessary exam and/or evaluation  Counseling and educating the patient/family/caregiver  Ordering medications, tests, or procedures  Referring and communicating with other healthcare professionals (when not reported separately)  Documenting clinical information in the electronic or paper health record  Independently interpreting results of tests/labs and communication of results to the family or caregiver  Care coordination (when not reported separately)   Neysa Hotter, MD 11/11/20243:26 PM

## 2023-07-12 ENCOUNTER — Ambulatory Visit (INDEPENDENT_AMBULATORY_CARE_PROVIDER_SITE_OTHER): Payer: 59 | Admitting: Psychiatry

## 2023-07-12 ENCOUNTER — Encounter: Payer: Self-pay | Admitting: Psychiatry

## 2023-07-12 VITALS — BP 154/93 | HR 74 | Temp 97.5°F | Ht 64.0 in | Wt 198.6 lb

## 2023-07-12 DIAGNOSIS — F4381 Prolonged grief disorder: Secondary | ICD-10-CM | POA: Diagnosis not present

## 2023-07-12 DIAGNOSIS — F411 Generalized anxiety disorder: Secondary | ICD-10-CM | POA: Diagnosis not present

## 2023-07-12 DIAGNOSIS — G4700 Insomnia, unspecified: Secondary | ICD-10-CM | POA: Diagnosis not present

## 2023-07-12 DIAGNOSIS — F331 Major depressive disorder, recurrent, moderate: Secondary | ICD-10-CM

## 2023-07-12 MED ORDER — ESCITALOPRAM OXALATE 10 MG PO TABS: 10.0000 mg | ORAL_TABLET | Freq: Every day | ORAL | 1 refills | Status: DC

## 2023-07-12 MED ORDER — TRAZODONE HCL 50 MG PO TABS: 25.0000 mg | ORAL_TABLET | Freq: Every evening | ORAL | 1 refills | Status: DC | PRN

## 2023-07-12 NOTE — Patient Instructions (Signed)
Start lexapro 5 mg daily for one week, then 10 mg daily  Start trazodone 25-50 mg at night as needed for insomnia Referral for evaluation of sleep apnea  Next appointment: 1/9 at 1:30 Obtain lab (TSH) at [facility]

## 2023-07-21 ENCOUNTER — Encounter: Payer: Self-pay | Admitting: Obstetrics and Gynecology

## 2023-07-21 ENCOUNTER — Ambulatory Visit: Payer: 59 | Admitting: Obstetrics and Gynecology

## 2023-07-21 VITALS — BP 156/96 | HR 70 | Ht 64.0 in | Wt 199.0 lb

## 2023-07-21 DIAGNOSIS — Z9889 Other specified postprocedural states: Secondary | ICD-10-CM

## 2023-07-21 DIAGNOSIS — Z4889 Encounter for other specified surgical aftercare: Secondary | ICD-10-CM

## 2023-07-21 NOTE — Progress Notes (Signed)
Patient presents for 6 week postop follow-up following A&P repair with TOT. She states having spotting but dark in color with a odor, this has been ongoing since surgery. Reports UTI symptoms have resolved but occasionally still leaking with coughing, reports much less then prior to surgery.

## 2023-07-21 NOTE — Progress Notes (Signed)
HPI:      [name] is a 57 y.o. [phone] who LMP was Patient's last menstrual period was 01/07/2017 (exact date).  Subjective:   She presents today 6 weeks from anterior and posterior repair surgery and TOT.  Patient reports that she is doing much better.  She has no further urinary symptoms.  She does state that when she urinates her urine does not run down her labia as previously sometimes runs back toward her buttocks.  She says this is not concerning just different. She does report she continues to have small amounts of vaginal bleeding but this has decreased over the last few days.  She occasionally has urine leakage but it is much better than before.    Hx: The following portions of the patient's history were reviewed and updated as appropriate:             She  has a past medical history of Anemia, Anemia, Anxiety, Arthritis, Complication of anesthesia, COVID-19, Depression, Dyspepsia, GERD (gastroesophageal reflux disease), Heart burn, Heart murmur, Hypertension, Irregular menstrual cycle, Stress incontinence, T2DM (type 2 diabetes mellitus) (HCC), and Transfusion of blood product refused for religious reason (Jehovah's witness). She does not have any pertinent problems on file. She  has a past surgical history that includes Cholecystectomy, laparoscopic; Laparoscopic ovarian cystectomy (Right); Tubal ligation; Meniscectomy (Right); Total knee arthroplasty (Right, 07/07/2016); Cholecystectomy; Joint replacement; Knee arthroscopy (Left, 12/03/2016); Abdominal hysterectomy (Left, 01/18/2017); Total knee arthroplasty (Left, 03/16/2017); Knee Closed Reduction (Left, 04/15/2017); Knee arthrotomy (Left, 10/21/2017); Esophagogastroduodenoscopy (N/A, 11/04/2017); Colonoscopy with propofol (N/A, 11/04/2017); Esophagogastroduodenoscopy; Esophagogastroduodenoscopy (egd) with propofol (N/A, 05/30/2019); Anterior and posterior repair (N/A, 06/07/2023); and Bladder suspension (N/A, 06/07/2023). Her family  history includes Diabetes in her father; Heart failure in her father; Hypertension in her father; Osteoarthritis in her mother and sister. She  reports that she quit smoking about 8 years ago. Her smoking use included cigarettes. She has never used smokeless tobacco. She reports current alcohol use. She reports that she does not use drugs. She has a current medication list which includes the following prescription(s): atorvastatin, trulicity, escitalopram, lisinopril-hydrochlorothiazide, metformin, metoprolol succinate, pantoprazole, trazodone, and vitamin d (ergocalciferol). She has No Known Allergies.       Review of Systems:  Review of Systems  Constitutional: Denied constitutional symptoms, night sweats, recent illness, fatigue, fever, insomnia and weight loss.  Eyes: Denied eye symptoms, eye pain, photophobia, vision change and visual disturbance.  Ears/Nose/Throat/Neck: Denied ear, nose, throat or neck symptoms, hearing loss, nasal discharge, sinus congestion and sore throat.  Cardiovascular: Denied cardiovascular symptoms, arrhythmia, chest pain/pressure, edema, exercise intolerance, orthopnea and palpitations.  Respiratory: Denied pulmonary symptoms, asthma, pleuritic pain, productive sputum, cough, dyspnea and wheezing.  Gastrointestinal: Denied, gastro-esophageal reflux, melena, nausea and vomiting.  Genitourinary: Denied genitourinary symptoms including symptomatic vaginal discharge, pelvic relaxation issues, and urinary complaints.  Musculoskeletal: Denied musculoskeletal symptoms, stiffness, swelling, muscle weakness and myalgia.  Dermatologic: Denied dermatology symptoms, rash and scar.  Neurologic: Denied neurology symptoms, dizziness, headache, neck pain and syncope.  Psychiatric: Denied psychiatric symptoms, anxiety and depression.  Endocrine: Denied endocrine symptoms including hot flashes and night sweats.   Meds:   Current Outpatient Medications on File Prior to Visit   Medication Sig Dispense Refill   atorvastatin (LIPITOR) 10 MG tablet Take 10 mg by mouth daily.     Dulaglutide (TRULICITY) 1.5 MG/0.5ML SOPN Inject into the skin once a week. Takes every Wednesday     escitalopram (LEXAPRO) 10 MG tablet Take 1 tablet (10 mg  total) by mouth daily. 30 tablet 1   lisinopril-hydrochlorothiazide (ZESTORETIC) 20-25 MG tablet Take 1 tablet by mouth daily.     metFORMIN (GLUCOPHAGE) 1000 MG tablet Take 1,000 mg by mouth daily with breakfast.     metoprolol succinate (TOPROL-XL) 25 MG 24 hr tablet Take 25 mg by mouth daily.     pantoprazole (PROTONIX) 40 MG tablet Take 40 mg by mouth 2 (two) times daily as needed.      traZODone (DESYREL) 50 MG tablet Take 0.5-1 tablets (25-50 mg total) by mouth at bedtime as needed for sleep. 30 tablet 1   Vitamin D, Ergocalciferol, (DRISDOL) 50000 units CAPS capsule Take 50,000 Units by mouth every 7 (seven) days.     No current facility-administered medications on file prior to visit.      Objective:     Vitals:   07/21/23 1347  BP: (!) 160/101  Pulse: 70   Filed Weights   07/21/23 1347  Weight: 199 lb (90.3 kg)              Physical examination   Pelvic:   Vulva: Normal appearance.  No lesions.  Vagina: No lesions or abnormalities noted.  Suture lines still present.  Vaginal diameter somewhat small at this time.  Support: Normal pelvic support.  Urethra No masses tenderness or scarring.  Meatus Normal size without lesions or prolapse.  Cervix: Surgically absent  Anus: Normal exam.  No lesions.  Perineum: Normal exam.  No lesions.            Assessment:    [id number] Patient Active Problem List   Diagnosis Date Noted   Chronic pain syndrome 10/16/2019   Cervicalgia 10/16/2019   Chronic neck pain (1ry area of Pain) (Bilateral) (L>R) 10/16/2019   Chronic shoulder pain (Left) 10/16/2019   Chronic upper extremity pain (Bilateral) 10/16/2019   Chronic low back pain (Bilateral) w/o sciatica 10/16/2019    Chronic lower extremity pain (Bilateral) (R>L) 10/16/2019   Chronic knee pain after total replacement (Bilateral) (L>R) 10/16/2019   Pharmacologic therapy 10/16/2019   Disorder of skeletal system 10/16/2019   Problems influencing health status 10/16/2019   DDD (degenerative disc disease), cervical 10/16/2019   DDD (degenerative disc disease), lumbosacral 10/16/2019   Abnormal MRI, cervical spine (09/29/2019) 10/16/2019   Bursitis of left shoulder 08/10/2019   Left cervical radiculopathy 08/10/2019   Bilateral arm pain 01/11/2019   Vitamin D deficiency 05/03/2018   Nonrheumatic aortic valve insufficiency 02/28/2018   Non-rheumatic mitral regurgitation 02/28/2018   Non-rheumatic tricuspid valve insufficiency 02/28/2018   Heart palpitations 02/07/2018   Hypertension 02/07/2018   Chronic pain of wrist (Left) 10/27/2017   Myofascial muscle pain 10/27/2017   Synovitis of left knee 10/21/2017   Chest pain on breathing 05/27/2017   Dizziness 05/27/2017   Episodic lightheadedness 05/27/2017   Myalgia 05/12/2017   Primary localized osteoarthritis of left knee 03/16/2017   Surgical menopause, symptomatic 01/26/2017   Status post abdominal hysterectomy and left salpingo-oophorectomy 01/26/2017   S/P TAH (total abdominal hysterectomy) 01/20/2017   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 Postoperative state     Patient much improved since last visit.  No issues with suture lines.  Much improved urine leakage.   Plan:            1.  Expectant management.  I think patient will continue to improve over the next few months.  If she continues to experience issues with urine stream direction consider urology referral as needed.  Refrain from intercourse for at least 3 more weeks. -May need systematic dilation  of the vagina after completely healing and may also need use of estrogen vaginal cream. Orders No orders of the defined types were placed in this encounter.   No orders of the defined types were placed in this encounter.     F/U  Return in about 3 months (around 10/21/2023).  Elonda Husky, M.D. 07/21/2023 2:11 PM

## 2023-07-23 ENCOUNTER — Encounter: Payer: Self-pay | Admitting: Psychiatry

## 2023-07-23 ENCOUNTER — Other Ambulatory Visit
Admission: RE | Admit: 2023-07-23 | Discharge: 2023-07-23 | Disposition: A | Discharge status: Home / Self Care | Payer: 59 | Attending: Psychiatry | Admitting: Psychiatry

## 2023-07-23 DIAGNOSIS — F411 Generalized anxiety disorder: Secondary | ICD-10-CM | POA: Insufficient documentation

## 2023-07-23 DIAGNOSIS — F331 Major depressive disorder, recurrent, moderate: Secondary | ICD-10-CM | POA: Diagnosis present

## 2023-07-23 LAB — TSH: TSH: 1.612 u[IU]/mL (ref 0.350–4.500)

## 2023-09-05 NOTE — Progress Notes (Signed)
 [facility] MD/PA/NP OP Progress Note  09/09/2023 5:37 PM Caitlyn [name]  MRN:  [id number]  Chief Complaint:  Chief Complaint  Patient presents with   Follow-up   HPI:  This is a follow-up appointment for depression, anxiety and insomnia.   The interview was done with Spanish interpreter.  She states that she is not doing good, but doing okay.  Although the medication has helped for her to feel calmer, and not that depressed, she is struggling with headache.  She spends time, doing crafts.  She likes sewing.  It flees from stress.  She had a good time with her husband and her grandchildren during holiday season.  She still has days feeling depressed.  She does not want to leave the house.  She does not want to cook or being around with people.  She does not want to leave the bed.  She is also struggling with middle insomnia.  On further evaluation, she may have gotten a sleep study in the hospital 1-2 years ago, and was recommended for treatment.  She agrees to contact the clinic to pursue this.  She would like medication to be prescribed for insomnia, although she also states that this is too much medication when she was offered.  After having discussed risk and benefit, she agrees with the plan as outlined below. The patient has mood symptoms as in PHQ-9/GAD-7. She denies SI.  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  She reports beautiful childhood.  Her father was absent, and her mother raised her. She moved to [facility] in 2015 as she wanted a change, and has gotten married with her current husband.  She does not think she can  live there anymore due to the heat. She enjoys tranquility and peace here. However, she misses fruits and the beach.    Substance use   Tobacco Alcohol Other substances/  Current   A beer at times denies  Past   denies denies  Past Treatment              Visit Diagnosis:    ICD-10-CM   1. MDD (major depressive disorder), recurrent episode, moderate (HCC)  F33.1     2. GAD (generalized anxiety disorder)  F41.1     3. 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 Insomnia, unspecified type  G47.00       Past Psychiatric History: Please see initial evaluation for full details. I have reviewed the history. No updates at this time.     Past Medical History:  Past Medical History:  Diagnosis Date   Anemia    Anemia    Anxiety    Arthritis    Complication of anesthesia    a.) anesthesia awareness - has woken up several times during surgery in the past; could not move but could hear   COVID-19    Depression    Dyspepsia    GERD (gastroesophageal reflux disease)    Heart burn    Heart murmur    Hypertension    Irregular menstrual cycle    Stress incontinence  T2DM (type 2 diabetes mellitus) (HCC)    Transfusion of blood product refused for religious reason (Jehovah's witness)     Past Surgical History:  Procedure Laterality Date   ABDOMINAL HYSTERECTOMY Left 01/18/2017   Procedure: HYSTERECTOMY ABDOMINAL WITH LEFT SALPINGO OOPHERECTOMY;  Surgeon: Kathe Gladis LABOR, MD;  Location: [facility];  Service: Gynecology;  Laterality: Left;   ANTERIOR AND POSTERIOR REPAIR N/A 06/07/2023   Procedure: ANTERIOR (CYSTOCELE) AND POSTERIOR REPAIR (RECTOCELE) WITH TOT;  Surgeon: Janit Alm Agent, MD;  Location: [facility];  Service: Gynecology;  Laterality: N/A;   BLADDER SUSPENSION N/A 06/07/2023   Procedure: TOT;  Surgeon: Janit Alm Agent, MD;  Location: [facility];  Service: Gynecology;  Laterality: N/A;   CHOLECYSTECTOMY     CHOLECYSTECTOMY, LAPAROSCOPIC     COLONOSCOPY WITH PROPOFOL   N/A 11/04/2017   Procedure: COLONOSCOPY WITH PROPOFOL ;  Surgeon: Gaylyn Gladis PENNER, MD;  Location: [facility];  Service: Endoscopy;  Laterality: N/A;   ESOPHAGOGASTRODUODENOSCOPY N/A 11/04/2017   Procedure: ESOPHAGOGASTRODUODENOSCOPY (EGD);  Surgeon: Gaylyn Gladis PENNER, MD;  Location: [facility] ENDOSCOPY;  Service: Endoscopy;  Laterality: N/A;   ESOPHAGOGASTRODUODENOSCOPY     ESOPHAGOGASTRODUODENOSCOPY (EGD) WITH PROPOFOL  N/A 05/30/2019   Procedure: ESOPHAGOGASTRODUODENOSCOPY (EGD) WITH PROPOFOL ;  Surgeon: Toledo, Ladell POUR, MD;  Location: [facility] ENDOSCOPY;  Service: Gastroenterology;  Laterality: N/A;   JOINT REPLACEMENT     KNEE ARTHROSCOPY Left 12/03/2016   Procedure: ARTHROSCOPY KNEE, partial medial menisectomy;  Surgeon: Ozell Flake, MD;  Location: [facility];  Service: Orthopedics;  Laterality: Left;   KNEE ARTHROTOMY Left 10/21/2017   Procedure: KNEE ARTHROTOMY LEFT LATERAL RELEASE MEDIAL RETINACULART REPAIR POLY EXCHANGE;  Surgeon: Flake Ozell, MD;  Location: [facility];  Service: Orthopedics;  Laterality: Left;   KNEE CLOSED REDUCTION Left 04/15/2017   Procedure: CLOSED MANIPULATION KNEE;  Surgeon: Flake Ozell, MD;  Location: [facility];  Service: Orthopedics;  Laterality: Left;   LAPAROSCOPIC OVARIAN CYSTECTOMY Right    MENISCECTOMY Right    TOTAL KNEE ARTHROPLASTY Right 07/07/2016   Procedure: TOTAL KNEE ARTHROPLASTY;  Surgeon: Ozell Flake, MD;  Location: [facility];  Service: Orthopedics;  Laterality: Right;   TOTAL KNEE ARTHROPLASTY Left 03/16/2017   Procedure: TOTAL KNEE ARTHROPLASTY;  Surgeon: Flake Ozell, MD;  Location: [facility];  Service: Orthopedics;  Laterality: Left;   TUBAL LIGATION      Family Psychiatric History: Please see initial evaluation for full details. I have reviewed the history. No updates at this time.     Family History:  Family History  Problem Relation Age of Onset   Osteoarthritis Mother    Heart failure Father    Diabetes Father    Hypertension Father     Osteoarthritis Sister    Breast cancer Neg Hx    Ovarian cancer Neg Hx    Colon cancer Neg Hx     Social History:  Social History   Socioeconomic History   Marital status: Married    Spouse name: Maranda   Number of children: Not on file   Years of education: Not on file   Highest education level: Not on file  Occupational History   Not on file  Tobacco Use   Smoking status: Former    Current packs/day: 0.00    Types: Cigarettes    Quit date: 03/01/2015    Years since quitting: 8.5   Smokeless tobacco: Never  Vaping Use   Vaping status: Never Used  Substance and Sexual Activity   Alcohol use: Yes    Comment: rarely  Drug use: No   Sexual activity: Not Currently    Partners: Male    Birth control/protection: Surgical    Comment: hysterectomy  Other Topics Concern   Not on file  Social History Narrative   Not on file   Social Drivers of Health   Financial Resource Strain: Not on file  Food Insecurity: Not on file  Transportation Needs: Not on file  Physical Activity: Not on file  Stress: Not on file  Social Connections: Not on file    Allergies: No Known Allergies  Metabolic Disorder Labs: No results found for: HGBA1C, MPG No results found for: PROLACTIN No results found for: CHOL, TRIG, HDL, CHOLHDL, VLDL, LDLCALC Lab Results  Component Value Date   TSH 1.612 07/23/2023    Therapeutic Level Labs: No results found for: LITHIUM No results found for: VALPROATE No results found for: CBMZ  Current Medications: Current Outpatient Medications  Medication Sig Dispense Refill   atorvastatin (LIPITOR) 10 MG tablet Take 10 mg by mouth daily.     Dulaglutide (TRULICITY) 1.5 MG/0.5ML SOPN Inject into the skin once a week. Takes every Wednesday     FLUoxetine  (PROZAC ) 20 MG capsule Take 1 capsule (20 mg total) by mouth daily. 30 capsule 1   lisinopril -hydrochlorothiazide (ZESTORETIC) 20-25 MG tablet Take 1 tablet by mouth daily.      metFORMIN (GLUCOPHAGE) 1000 MG tablet Take 1,000 mg by mouth daily with breakfast.     metoprolol succinate (TOPROL-XL) 25 MG 24 hr tablet Take 25 mg by mouth daily.     pantoprazole  (PROTONIX ) 40 MG tablet Take 40 mg by mouth 2 (two) times daily as needed.      ramelteon  (ROZEREM ) 8 MG tablet Take 1 tablet (8 mg total) by mouth at bedtime as needed for sleep. 30 tablet 1   traZODone  (DESYREL ) 50 MG tablet Take 0.5-1 tablets (25-50 mg total) by mouth at bedtime as needed for sleep. 30 tablet 1   Vitamin D, Ergocalciferol, (DRISDOL) 50000 units CAPS capsule Take 50,000 Units by mouth every 7 (seven) days.     No current facility-administered medications for this visit.     Musculoskeletal: Strength & Muscle Tone: within normal limits Gait & Station: normal Patient leans: N/A  Psychiatric Specialty Exam: Review of Systems  Psychiatric/Behavioral:  Positive for dysphoric mood and sleep disturbance. Negative for agitation, behavioral problems, confusion, decreased concentration, hallucinations, self-injury and suicidal ideas. The patient is nervous/anxious. The patient is not hyperactive.   All other systems reviewed and are negative.   Blood pressure (!) 142/83, pulse 62, temperature 98.9 F (37.2 C), temperature source Temporal, height 5' 4 (1.626 m), weight 198 lb 6.4 oz (90 kg), last menstrual period 01/07/2017, SpO2 98%.Body mass index is 34.06 kg/m.  General Appearance: Well Groomed  Eye Contact:  Good  Speech:  Clear and Coherent  Volume:  Normal  Mood:   calmer  Affect:  Appropriate, Congruent, and Full Range  Thought Process:  Coherent  Orientation:  Full (Time, Place, and Person)  Thought Content: Logical   Suicidal Thoughts:  No  Homicidal Thoughts:  No  Memory:  Immediate;   Good  Judgement:  Good  Insight:  Good  Psychomotor Activity:  Normal  Concentration:  Concentration: Good and Attention Span: Good  Recall:  Good  Fund of Knowledge: Good  Language: Good   Akathisia:  No  Handed:  Right  AIMS (if indicated): not done  Assets:  Communication Skills Desire for Improvement  ADL's:  Intact  Cognition: WNL  Sleep:  Poor   Screenings: GAD-7    Flowsheet Row Office Visit from 09/09/2023 in [facility] Office Visit from 07/12/2023 in [facility]  Total GAD-7 Score 3 19      PHQ2-9    Flowsheet Row Office Visit from 09/09/2023 in [facility] Office Visit from 07[facility]12/2023 in [facility]  PHQ-2 Total Score 2 5  PHQ-9 Total Score 11 16      Flowsheet Row Admission (Discharged) from 06/07/2023 in [facility] from 10/18/2022 in [facility] at [facility] ED from 10/16/2022 in [facility] at [facility]  C-SSRS RISK CATEGORY No Risk No Risk No Risk        Assessment and Plan:  Caitlyn [name] is a 58 y.o. year old female with a history of depression, diabetes, hypertension, who is referred for depression.   1. MDD (major depressive disorder), recurrent episode, moderate (HCC) 2. GAD (generalized anxiety disorder) 3. Prolonged grief disorder Acute stressors include:  Other stressors include:  loss of her son in his 6's after being confined in [place]   History: depression, anxiety since loss of her son   Exam is notable for brighter affect, and she reports overall improvement in mood symptoms since starting Lexapro .  However, she experienced side effect of headache.  She agrees to cross taper to fluoxetine  to see if it mitigates the risk.  Discussed potential risk of medication interaction with metoprolol.  Although she will greatly benefit from CBT, she is not interested in this.   4. Insomnia, unspecified type - reportedly diagnosed with sleep apnea through hospital sleep  test She continues to have middle insomnia, and has limited benefit from trazodone .  Will try ramelteon  to target this.  Noted that she was diagnosed with sleep apnea in the past, and was reportedly recommended to try CPAP machine.  She agrees to contact her provider to proceed with this. Will not try Ambien  due to concern of dependence.     Plan Start fluoxetine  20 mg daily  Decrease lexapro  5 mg daily for one week, then discontinue (due to headache)  Start ramelteon  8 mg at night  Next appointment: 2/27 at 11 am,IP   Past trials of medication- sertraline , lexapro  (headache), trazodone , Ambien    The patient demonstrates the following risk factors for suicide: Chronic risk factors for suicide include: psychiatric disorder of depression, anxiety . Acute risk factors for suicide include: loss (financial, interpersonal, professional). Protective factors for this patient include: positive social support, responsibility to others (children, family), coping skills, and hope for the future. Considering these factors, the overall suicide risk at this point appears to be low. Patient is appropriate for outpatient follow up.  Collaboration of Care: Collaboration of Care: Other reviewed notes in epic  Patient/Guardian was advised Release of Information must be obtained prior to any record release in order to collaborate their care with an outside provider. Patient/Guardian was advised if they have not already done so to contact the registration department to sign all necessary forms in order for us  to release information regarding their care.   Consent: Patient/Guardian gives verbal consent for treatment and assignment of benefits for services provided during this visit. Patient/Guardian expressed understanding and agreed to proceed.    Katheren Sleet, MD 09/09/2023, 5:37 PM

## 2023-09-06 ENCOUNTER — Other Ambulatory Visit: Payer: Self-pay | Admitting: Psychiatry

## 2023-09-09 ENCOUNTER — Ambulatory Visit (INDEPENDENT_AMBULATORY_CARE_PROVIDER_SITE_OTHER): Payer: 59 | Admitting: Psychiatry

## 2023-09-09 ENCOUNTER — Encounter: Payer: Self-pay | Admitting: Psychiatry

## 2023-09-09 VITALS — BP 142/83 | HR 62 | Temp 98.9°F | Ht 64.0 in | Wt 198.4 lb

## 2023-09-09 DIAGNOSIS — F411 Generalized anxiety disorder: Secondary | ICD-10-CM | POA: Diagnosis not present

## 2023-09-09 DIAGNOSIS — F4381 Prolonged grief disorder: Secondary | ICD-10-CM | POA: Diagnosis not present

## 2023-09-09 DIAGNOSIS — F331 Major depressive disorder, recurrent, moderate: Secondary | ICD-10-CM

## 2023-09-09 DIAGNOSIS — G4700 Insomnia, unspecified: Secondary | ICD-10-CM

## 2023-09-09 MED ORDER — FLUOXETINE HCL 20 MG PO CAPS: 20.0000 mg | ORAL_CAPSULE | Freq: Every day | ORAL | 1 refills | Status: DC

## 2023-09-09 MED ORDER — RAMELTEON 8 MG PO TABS: 8.0000 mg | ORAL_TABLET | Freq: Every evening | ORAL | 1 refills | Status: DC | PRN

## 2023-09-09 NOTE — Patient Instructions (Addendum)
 Start fluoxetine 20 mg daily  Decrease lexapro 5 mg daily for one week, then discontinue  Start ramelteon 8 mg at night  Next appointment: 2/27 at 11 am

## 2023-09-21 ENCOUNTER — Encounter: Payer: Self-pay | Admitting: Emergency Medicine

## 2023-09-21 ENCOUNTER — Emergency Department
Admission: EM | Admit: 2023-09-21 | Discharge: 2023-09-21 | Disposition: A | Discharge status: Home / Self Care | Payer: 59 | Attending: Emergency Medicine | Admitting: Emergency Medicine

## 2023-09-21 ENCOUNTER — Other Ambulatory Visit: Payer: Self-pay

## 2023-09-21 DIAGNOSIS — Z23 Encounter for immunization: Secondary | ICD-10-CM | POA: Insufficient documentation

## 2023-09-21 DIAGNOSIS — T23251A Burn of second degree of right palm, initial encounter: Secondary | ICD-10-CM | POA: Insufficient documentation

## 2023-09-21 DIAGNOSIS — I10 Essential (primary) hypertension: Secondary | ICD-10-CM | POA: Diagnosis not present

## 2023-09-21 DIAGNOSIS — X158XXA Contact with other hot household appliances, initial encounter: Secondary | ICD-10-CM | POA: Diagnosis not present

## 2023-09-21 DIAGNOSIS — T23252A Burn of second degree of left palm, initial encounter: Secondary | ICD-10-CM | POA: Insufficient documentation

## 2023-09-21 DIAGNOSIS — T300 Burn of unspecified body region, unspecified degree: Secondary | ICD-10-CM

## 2023-09-21 DIAGNOSIS — T3110 Burns involving 10-19% of body surface with 0% to 9% third degree burns: Secondary | ICD-10-CM | POA: Diagnosis not present

## 2023-09-21 DIAGNOSIS — T23052A Burn of unspecified degree of left palm, initial encounter: Secondary | ICD-10-CM | POA: Diagnosis present

## 2023-09-21 DIAGNOSIS — E119 Type 2 diabetes mellitus without complications: Secondary | ICD-10-CM | POA: Insufficient documentation

## 2023-09-21 MED ORDER — TETANUS-DIPHTH-ACELL PERTUSSIS 5-2.5-18.5 LF-MCG/0.5 IM SUSY
0.5000 mL | PREFILLED_SYRINGE | Freq: Once | INTRAMUSCULAR | Status: AC
  Administered 2023-09-21: 0.5 mL via INTRAMUSCULAR
  Filled 2023-09-21: qty 0.5

## 2023-09-21 MED ORDER — BACITRACIN ZINC 500 UNIT/GM EX OINT
TOPICAL_OINTMENT | Freq: Once | CUTANEOUS | Status: AC
  Filled 2023-09-21: qty 0.9

## 2023-09-21 MED ORDER — HYDROCODONE-ACETAMINOPHEN 5-325 MG PO TABS: 1.0000 | ORAL_TABLET | ORAL | 0 refills | Status: AC | PRN

## 2023-09-21 MED ORDER — HYDROCODONE-ACETAMINOPHEN 5-325 MG PO TABS
1.0000 | ORAL_TABLET | Freq: Once | ORAL | Status: AC
  Administered 2023-09-21: 1 via ORAL
  Filled 2023-09-21: qty 1

## 2023-09-21 MED ORDER — BACITRACIN ZINC 500 UNIT/GM EX OINT: TOPICAL_OINTMENT | CUTANEOUS | 0 refills | Status: AC

## 2023-09-21 NOTE — Discharge Instructions (Addendum)
Please follow-up with the wound care center in approximately 1 week for recheck of your wounds.  As we discussed please remove your dressings once daily Place bacitracin on the wounds and then rewrap the dressings.  Return to the emergency department for any increased pain swelling redness fever or any other symptom concerning to yourself.
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
## 2023-09-30 ENCOUNTER — Emergency Department
Admission: EM | Admit: 2023-09-30 | Discharge: 2023-09-30 | Discharge status: Against Medical Advice | Payer: 59 | Attending: Emergency Medicine | Admitting: Emergency Medicine

## 2023-09-30 ENCOUNTER — Emergency Department: Payer: 59

## 2023-09-30 DIAGNOSIS — R0602 Shortness of breath: Secondary | ICD-10-CM | POA: Insufficient documentation

## 2023-09-30 DIAGNOSIS — Z5321 Procedure and treatment not carried out due to patient leaving prior to being seen by health care provider: Secondary | ICD-10-CM | POA: Insufficient documentation

## 2023-09-30 DIAGNOSIS — J45909 Unspecified asthma, uncomplicated: Secondary | ICD-10-CM | POA: Diagnosis not present

## 2023-09-30 LAB — CBC WITH DIFFERENTIAL/PLATELET
Abs Immature Granulocytes: 0.01 10*3/uL (ref 0.00–0.07)
Basophils Absolute: 0 10*3/uL (ref 0.0–0.1)
Basophils Relative: 1 %
Eosinophils Absolute: 0.3 10*3/uL (ref 0.0–0.5)
Eosinophils Relative: 7 %
HCT: 37.9 % (ref 36.0–46.0)
Hemoglobin: 12.5 g/dL (ref 12.0–15.0)
Immature Granulocytes: 0 %
Lymphocytes Relative: 48 %
Lymphs Abs: 2.1 10*3/uL (ref 0.7–4.0)
MCH: 27.1 pg (ref 26.0–34.0)
MCHC: 33 g/dL (ref 30.0–36.0)
MCV: 82.2 fL (ref 80.0–100.0)
Monocytes Absolute: 0.4 10*3/uL (ref 0.1–1.0)
Monocytes Relative: 9 %
Neutro Abs: 1.5 10*3/uL — ABNORMAL LOW (ref 1.7–7.7)
Neutrophils Relative %: 35 %
Platelets: 185 10*3/uL (ref 150–400)
RBC: 4.61 MIL/uL (ref 3.87–5.11)
RDW: 12.6 % (ref 11.5–15.5)
WBC: 4.3 10*3/uL (ref 4.0–10.5)
nRBC: 0 % (ref 0.0–0.2)

## 2023-09-30 LAB — BASIC METABOLIC PANEL
Anion gap: 11 (ref 5–15)
BUN: 10 mg/dL (ref 6–20)
CO2: 28 mmol/L (ref 22–32)
Calcium: 9.2 mg/dL (ref 8.9–10.3)
Chloride: 103 mmol/L (ref 98–111)
Creatinine, Ser: 0.77 mg/dL (ref 0.44–1.00)
GFR, Estimated: >60 mL/min (ref >60)
Glucose, Bld: 128 mg/dL — ABNORMAL HIGH (ref 70–99)
Potassium: 3.6 mmol/L (ref 3.5–5.1)
Sodium: 142 mmol/L (ref 135–145)

## 2023-09-30 MED ORDER — ALBUTEROL SULFATE HFA 108 (90 BASE) MCG/ACT IN AERS: 2.0000 | INHALATION_SPRAY | RESPIRATORY_TRACT | Status: DC | PRN

## 2023-09-30 NOTE — ED Triage Notes (Signed)
Patient also states she sees a pulmonologist for asthma

## 2023-09-30 NOTE — ED Triage Notes (Signed)
Patient C/O SOB after being diagnosed with the flu last week. Patient states that she has had a productive cough. Denies any fevers today, but had fevers last week.

## 2023-09-30 NOTE — ED Provider Triage Note (Signed)
Emergency Medicine Provider Triage Evaluation Note  Caitlyn [name] , a 58 y.o. female  was evaluated in triage.  Pt complains of shortness of breath. She had the flu last week and shortness of breath is getting worse. History of asthma and not currently using any inhalers at this time.   Physical Exam  BP (!) 149/85   Pulse 80   Temp 99.1 F (37.3 C) (Oral)   Resp 18   Wt 89.8 kg   LMP 01/07/2017 (Exact Date) Comment: tah/bso  SpO2 94%   BMI 33.99 kg/m  Gen:   Awake, no distress   Resp:  Normal effort  MSK:   Moves extremities without difficulty  Other:    Medical Decision Making  Medically screening exam initiated at 7:36 PM.  Appropriate orders placed.  Dameisha Torres-Tirado was informed that the remainder of the evaluation will be completed by another provider, this initial triage assessment does not replace that evaluation, and the importance of remaining in the ED until their evaluation is complete.    Chinita Pester, FNP 09/30/23 [phone]

## 2023-10-10 ENCOUNTER — Other Ambulatory Visit: Payer: Self-pay

## 2023-10-10 ENCOUNTER — Emergency Department: Payer: 59

## 2023-10-10 ENCOUNTER — Emergency Department
Admission: EM | Admit: 2023-10-10 | Discharge: 2023-10-10 | Disposition: A | Discharge status: Home / Self Care | Payer: 59 | Attending: Emergency Medicine | Admitting: Emergency Medicine

## 2023-10-10 DIAGNOSIS — R1031 Right lower quadrant pain: Secondary | ICD-10-CM | POA: Insufficient documentation

## 2023-10-10 DIAGNOSIS — I10 Essential (primary) hypertension: Secondary | ICD-10-CM | POA: Diagnosis not present

## 2023-10-10 DIAGNOSIS — R109 Unspecified abdominal pain: Secondary | ICD-10-CM

## 2023-10-10 LAB — HEPATIC FUNCTION PANEL
ALT: 35 U/L (ref 0–44)
AST: 30 U/L (ref 15–41)
Albumin: 4.4 g/dL (ref 3.5–5.0)
Alkaline Phosphatase: 66 U/L (ref 38–126)
Bilirubin, Direct: <0.1 mg/dL (ref 0.0–0.2)
Total Bilirubin: 0.7 mg/dL (ref 0.0–1.2)
Total Protein: 7.6 g/dL (ref 6.5–8.1)

## 2023-10-10 LAB — URINALYSIS, ROUTINE W REFLEX MICROSCOPIC
Bilirubin Urine: NEGATIVE
Glucose, UA: NEGATIVE mg/dL
Hgb urine dipstick: NEGATIVE
Ketones, ur: NEGATIVE mg/dL
Leukocytes,Ua: NEGATIVE
Nitrite: NEGATIVE
Protein, ur: NEGATIVE mg/dL
Specific Gravity, Urine: 1.004 — ABNORMAL LOW (ref 1.005–1.030)
pH: 6 (ref 5.0–8.0)

## 2023-10-10 LAB — BASIC METABOLIC PANEL
Anion gap: 15 (ref 5–15)
BUN: 19 mg/dL (ref 6–20)
CO2: 25 mmol/L (ref 22–32)
Calcium: 9.3 mg/dL (ref 8.9–10.3)
Chloride: 97 mmol/L — ABNORMAL LOW (ref 98–111)
Creatinine, Ser: 1.01 mg/dL — ABNORMAL HIGH (ref 0.44–1.00)
GFR, Estimated: >60 mL/min (ref >60)
Glucose, Bld: 133 mg/dL — ABNORMAL HIGH (ref 70–99)
Potassium: 3.7 mmol/L (ref 3.5–5.1)
Sodium: 137 mmol/L (ref 135–145)

## 2023-10-10 LAB — CBC
HCT: 38.9 % (ref 36.0–46.0)
Hemoglobin: 13.1 g/dL (ref 12.0–15.0)
MCH: 27.6 pg (ref 26.0–34.0)
MCHC: 33.7 g/dL (ref 30.0–36.0)
MCV: 81.9 fL (ref 80.0–100.0)
Platelets: 348 10*3/uL (ref 150–400)
RBC: 4.75 MIL/uL (ref 3.87–5.11)
RDW: 12.4 % (ref 11.5–15.5)
WBC: 7.5 10*3/uL (ref 4.0–10.5)
nRBC: 0 % (ref 0.0–0.2)

## 2023-10-10 LAB — LIPASE, BLOOD: Lipase: 47 U/L (ref 11–51)

## 2023-10-10 MED ORDER — CYCLOBENZAPRINE HCL 5 MG PO TABS: 5.0000 mg | ORAL_TABLET | Freq: Three times a day (TID) | ORAL | 0 refills | Status: AC | PRN

## 2023-10-10 NOTE — ED Triage Notes (Signed)
 Pt to ED via POV c/o right sided flank pain that radiates to right side of abdomen and down groin area. Started a couple days ago. Denies N/V/D today. Denies CP, SHOB, dizziness, fevers

## 2023-10-10 NOTE — ED Provider Notes (Signed)
 [facility] Provider Note    Event Date/Time   First MD Initiated Contact with Patient 10/10/23 2129     (approximate)   History   Chief Complaint Flank Pain   HPI  Caitlyn [name] is a 58 y.o. female with past medical history of hypertension, anemia, and chronic pain syndrome who presents to the ED complaining of flank pain.  Patient reports that for the past 2 to 3 days she has been dealing with pain starting in the right side of her abdomen and moving down through the top of her right leg.  Pain seems to be worse when she is walking but she denies any falls or other injuries to her abdomen or leg.  She has not had any nausea, vomiting, diarrhea, constipation, dysuria, hematuria, or fever.  She does endorse occasional pain radiating around to her right flank.  She has not had any swelling or redness to the leg.  She denies any history of similar symptoms, has had prior cholecystectomy.     Physical Exam   Triage Vital Signs: ED Triage Vitals [10/10/23 1952]  Encounter Vitals Group     BP 133/68     Systolic BP Percentile      Diastolic BP Percentile      Pulse Rate 84     Resp 20     Temp 98 F (36.7 C)     Temp src      SpO2 100 %     Weight 193 lb (87.5 kg)     Height 5' 4 (1.626 m)     Head Circumference      Peak Flow      Pain Score 3     Pain Loc      Pain Education      Exclude from Growth Chart     Most recent vital signs: Vitals:   10/10/23 1952 10/10/23 2227  BP: 133/68 122/74  Pulse: 84 73  Resp: 20 16  Temp: 98 F (36.7 C) 98 F (36.7 C)  SpO2: 100% 99%    Constitutional: Alert and oriented. Eyes: Conjunctivae are normal. Head: Atraumatic. Nose: No congestion/rhinnorhea. Mouth/Throat: Mucous membranes are moist.  Cardiovascular: Normal rate, regular rhythm. Grossly normal heart sounds.  2+ radial and DP pulses bilaterally. Respiratory: Normal respiratory effort.  No retractions. Lungs CTAB. Gastrointestinal:  Soft and nontender. No distention.  No CVA tenderness bilaterally. Musculoskeletal: No lower extremity tenderness nor edema.  Neurologic:  Normal speech and language. No gross focal neurologic deficits are appreciated.    ED Results / Procedures / Treatments   Labs (all labs ordered are listed, but only abnormal results are displayed) Labs Reviewed  BASIC METABOLIC PANEL - Abnormal; Notable for the following components:      Result Value   Chloride 97 (*)    Glucose, Bld 133 (*)    Creatinine, Ser 1.01 (*)    All other components within normal limits  URINALYSIS, ROUTINE W REFLEX MICROSCOPIC - Abnormal; Notable for the following components:   Color, Urine STRAW (*)    APPearance CLEAR (*)    Specific Gravity, Urine 1.004 (*)    All other components within normal limits  CBC  HEPATIC FUNCTION PANEL  LIPASE, BLOOD    RADIOLOGY CT renal protocol reviewed and interpreted by me with no ureteral stone, inflammatory changes, or dilated bowel loops.  PROCEDURES:  Critical Care performed: No  Procedures   MEDICATIONS ORDERED IN ED: Medications - No data to display  IMPRESSION / MDM / ASSESSMENT AND PLAN / ED COURSE  I reviewed the triage vital signs and the nursing notes.                              58 y.o. female with past medical history of hypertension, anemia, and chronic pain syndrome who presents to the ED with 2 to 3 days of pain starting in the right side of her abdomen and moving down into her right leg.  Patient's presentation is most consistent with acute presentation with potential threat to life or bodily function.  Differential diagnosis includes, but is not limited to, kidney stone, pyelonephritis, appendicitis, retained gallstones, hepatitis, pancreatitis, bowel obstruction, DVT.  Patient nontoxic-appearing and in no acute distress, vital signs are unremarkable.  Her abdominal exam is benign and she is neurovascularly intact to her right lower extremity.   No findings concerning for skin or soft tissue infection, no symptoms in her right lower extremity concerning for DVT.  Labs are reassuring with no significant anemia, leukocytosis, electrolyte abnormality, or AKI.  LFTs and lipase are also unremarkable, urinalysis with no signs of infection.  Given reassuring workup, patient appropriate for discharge home, will treat for possible muscle strain with muscle relaxant and she was counseled to follow-up with her PCP.  She was counseled to return to the ED for new or worsening symptoms, patient agrees with plan.      FINAL CLINICAL IMPRESSION(S) / ED DIAGNOSES   Final diagnoses:  Right flank pain     Rx / DC Orders   ED Discharge Orders          Ordered    cyclobenzaprine  (FLEXERIL ) 5 MG tablet  3 times daily PRN        10/10/23 2211             Note:  This document was prepared using Dragon voice recognition software and may include unintentional dictation errors.   Willo Dunnings, MD 10/10/23 [phone]

## 2023-10-24 NOTE — Progress Notes (Unsigned)
 BH MD/PA/NP OP Progress Note  10/28/2023 1:45 PM Caitlyn [name]  MRN:  [id number]  Chief Complaint:  Chief Complaint  Patient presents with   Follow-up   HPI:  This is a follow-up appointment for depression, and anxiety, insomnia.  The interview was done with Spanish interpreter.  She states that she has been doing better.  She thinks the medicine helped a lot.  She goes out with a dog.  She reports fair relationship with her husband.  She reports improvement in appetite as her anxiety improves.  She has not eaten between meals anymore, although she used to like to eat sweets.  Although she may feel down, and experiences anhedonia once a week, she thinks it has been more manageable.  She denies SI.  She tends to wake up easily, although ramelteon helps for initial insomnia.  She feels tired in the morning, feeling not refreshed.  She denies dizziness.  She agrees with the plan as outlined below.   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  She reports beautiful childhood.  Her father was absent, and her mother raised her. She moved to [facility] in 2015 as she wanted a change, and has gotten married with her current husband.  She does not think she can live there anymore due to the heat. She enjoys tranquility and peace here. However, she misses fruits and the beach.    Substance use   Tobacco Alcohol Other substances/  Current   A beer at times denies  Past   denies denies  Past Treatment           Visit Diagnosis:    ICD-10-CM   1. MDD (major depressive disorder), recurrent episode, mild (HCC)  F33.0     2. GAD (generalized  anxiety disorder)  F41.1     3. 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 Insomnia, unspecified type  G47.00     5. MDD (major depressive disorder), recurrent episode, moderate (HCC)  F33.1       Past Psychiatric History: Please see initial evaluation for full details. I have reviewed the history. No updates at this time.     Past Medical History:  Past Medical History:  Diagnosis Date   Anemia    Anemia    Anxiety    Arthritis    Complication of anesthesia    a.) anesthesia awareness - has woken up several times during surgery in the past; could not move but could hear   COVID-19    Depression    Dyspepsia    GERD (gastroesophageal reflux disease)    Heart burn    Heart murmur    Hypertension    Irregular menstrual cycle    Stress incontinence    T2DM (type 2 diabetes mellitus) (HCC)    Transfusion of blood product refused for religious reason (Jehovah's witness)     Past Surgical History:  Procedure Laterality Date   ABDOMINAL HYSTERECTOMY Left 01/18/2017   Procedure: HYSTERECTOMY ABDOMINAL WITH LEFT SALPINGO OOPHERECTOMY;  Surgeon: Herold Harms, MD;  Location: [facility] ORS;  Service: Gynecology;  Laterality: Left;  ANTERIOR AND POSTERIOR REPAIR N/A 06/07/2023   Procedure: ANTERIOR (CYSTOCELE) AND POSTERIOR REPAIR (RECTOCELE) WITH TOT;  Surgeon: Linzie Collin, MD;  Location: [facility] ORS;  Service: Gynecology;  Laterality: N/A;   BLADDER SUSPENSION N/A 06/07/2023   Procedure: TOT;  Surgeon: Linzie Collin, MD;  Location: [facility] ORS;  Service: Gynecology;  Laterality: N/A;   CHOLECYSTECTOMY     CHOLECYSTECTOMY, LAPAROSCOPIC     COLONOSCOPY WITH PROPOFOL N/A 11/04/2017   Procedure: COLONOSCOPY WITH PROPOFOL;  Surgeon: Christena Deem, MD;  Location: [facility] ENDOSCOPY;  Service: Endoscopy;  Laterality: N/A;   ESOPHAGOGASTRODUODENOSCOPY N/A 11/04/2017   Procedure: ESOPHAGOGASTRODUODENOSCOPY (EGD);  Surgeon: Christena Deem, MD;  Location: [facility] ENDOSCOPY;  Service:  Endoscopy;  Laterality: N/A;   ESOPHAGOGASTRODUODENOSCOPY     ESOPHAGOGASTRODUODENOSCOPY (EGD) WITH PROPOFOL N/A 05/30/2019   Procedure: ESOPHAGOGASTRODUODENOSCOPY (EGD) WITH PROPOFOL;  Surgeon: Toledo, Boykin Nearing, MD;  Location: [facility] ENDOSCOPY;  Service: Gastroenterology;  Laterality: N/A;   JOINT REPLACEMENT     KNEE ARTHROSCOPY Left 12/03/2016   Procedure: ARTHROSCOPY KNEE, partial medial menisectomy;  Surgeon: Kennedy Bucker, MD;  Location: [facility] ORS;  Service: Orthopedics;  Laterality: Left;   KNEE ARTHROTOMY Left 10/21/2017   Procedure: KNEE ARTHROTOMY LEFT LATERAL RELEASE MEDIAL RETINACULART REPAIR POLY EXCHANGE;  Surgeon: Kennedy Bucker, MD;  Location: [facility] ORS;  Service: Orthopedics;  Laterality: Left;   KNEE CLOSED REDUCTION Left 04/15/2017   Procedure: CLOSED MANIPULATION KNEE;  Surgeon: Kennedy Bucker, MD;  Location: [facility] ORS;  Service: Orthopedics;  Laterality: Left;   LAPAROSCOPIC OVARIAN CYSTECTOMY Right    MENISCECTOMY Right    TOTAL KNEE ARTHROPLASTY Right 07/07/2016   Procedure: TOTAL KNEE ARTHROPLASTY;  Surgeon: Kennedy Bucker, MD;  Location: [facility] ORS;  Service: Orthopedics;  Laterality: Right;   TOTAL KNEE ARTHROPLASTY Left 03/16/2017   Procedure: TOTAL KNEE ARTHROPLASTY;  Surgeon: Kennedy Bucker, MD;  Location: [facility] ORS;  Service: Orthopedics;  Laterality: Left;   TUBAL LIGATION      Family Psychiatric History: Please see initial evaluation for full details. I have reviewed the history. No updates at this time.     Family History:  Family History  Problem Relation Age of Onset   Osteoarthritis Mother    Heart failure Father    Diabetes Father    Hypertension Father    Osteoarthritis Sister    Breast cancer Neg Hx    Ovarian cancer Neg Hx    Colon cancer Neg Hx     Social History:  Social History   Socioeconomic History   Marital status: Married    Spouse name: Delton See   Number of children: Not on file   Years of education: Not on file   Highest education level: Not on  file  Occupational History   Not on file  Tobacco Use   Smoking status: Former    Current packs/day: 0.00    Types: Cigarettes    Quit date: 03/01/2015    Years since quitting: 8.6   Smokeless tobacco: Never  Vaping Use   Vaping status: Never Used  Substance and Sexual Activity   Alcohol use: Yes    Comment: rarely   Drug use: No   Sexual activity: Not Currently    Partners: Male    Birth control/protection: Surgical    Comment: hysterectomy  Other Topics Concern   Not on file  Social History Narrative   Not on file   Social Drivers of Health   Financial Resource Strain: Not on file  Food Insecurity: Not on file  Transportation Needs: Not on file  Physical Activity: Not on file  Stress: Not on file  Social Connections: Not on file    Allergies: No Known Allergies  Metabolic Disorder Labs: No results found for: "HGBA1C", "MPG" No results found for: "PROLACTIN" No results found for: "CHOL", "TRIG", "HDL", "CHOLHDL", "VLDL", "LDLCALC" Lab Results  Component Value Date   TSH 1.612 07/23/2023    Therapeutic Level Labs: No results found for: "LITHIUM" No results found for: "VALPROATE" No results found for: "CBMZ"  Current Medications: Current Outpatient Medications  Medication Sig Dispense Refill   atorvastatin (LIPITOR) 10 MG tablet Take 10 mg by mouth daily.     bacitracin ointment Apply to affected area daily 30 g 0   cyclobenzaprine (FLEXERIL) 5 MG tablet Take 1 tablet (5 mg total) by mouth 3 (three) times daily as needed. 12 tablet 0   Dulaglutide (TRULICITY) 1.5 MG/0.5ML SOPN Inject into the skin once a week. Takes every Wednesday     HYDROcodone-acetaminophen (NORCO/VICODIN) 5-325 MG tablet Take 1 tablet by mouth every 4 (four) hours as needed. 15 tablet 0   lisinopril-hydrochlorothiazide (ZESTORETIC) 20-25 MG tablet Take 1 tablet by mouth daily.     metFORMIN (GLUCOPHAGE) 1000 MG tablet Take 1,000 mg by mouth daily with breakfast.     metoprolol succinate  (TOPROL-XL) 25 MG 24 hr tablet Take 25 mg by mouth daily.     pantoprazole (PROTONIX) 40 MG tablet Take 40 mg by mouth 2 (two) times daily as needed.      Vitamin D, Ergocalciferol, (DRISDOL) 50000 units CAPS capsule Take 50,000 Units by mouth every 7 (seven) days.     [START ON 11/08/2023] FLUoxetine (PROZAC) 20 MG capsule Take 1 capsule (20 mg total) by mouth daily. 30 capsule 1   [START ON 11/08/2023] ramelteon (ROZEREM) 8 MG tablet Take 1 tablet (8 mg total) by mouth at bedtime as needed for sleep. 30 tablet 1   No current facility-administered medications for this visit.     Musculoskeletal: Strength & Muscle Tone: within normal limits Gait & Station: normal Patient leans: N/A  Psychiatric Specialty Exam: Review of Systems  Psychiatric/Behavioral:  Positive for dysphoric mood and sleep disturbance. Negative for agitation, behavioral problems, confusion, decreased concentration, hallucinations, self-injury and suicidal ideas. The patient is nervous/anxious. The patient is not hyperactive.   All other systems reviewed and are negative.   Blood pressure 120/76, pulse 66, temperature 97.8 F (36.6 C), temperature source Skin, height 5\' 4"  (1.626 m), weight 191 lb (86.6 kg), last menstrual period 01/07/2017.Body mass index is 32.79 kg/m.  General Appearance: Well Groomed  Eye Contact:  Good  Speech:  Clear and Coherent  Volume:  Normal  Mood:   better  Affect:  Appropriate, Congruent, and brighter  Thought Process:  Coherent  Orientation:  Full (Time, Place, and Person)  Thought Content: Logical   Suicidal Thoughts:  No  Homicidal Thoughts:  No  Memory:  Immediate;   Good  Judgement:  Good  Insight:  Good  Psychomotor Activity:  Normal  Concentration:  Concentration: Good and Attention Span: Good  Recall:  Good  Fund of Knowledge: Good  Language: Good  Akathisia:  No  Handed:  Right  AIMS (if indicated): not done  Assets:  Communication Skills Desire for Improvement   ADL's:  Intact  Cognition: WNL  Sleep:  Poor   Screenings: GAD-7    Flowsheet Row Office Visit from 09/09/2023 in [facility] Office Visit from 07/12/2023 in  [facility]  Total GAD-7 Score 3 19      PHQ2-9    Flowsheet Row Office Visit from 09/09/2023 in [facility] Office Visit from 07/12/2023 in [facility]  PHQ-2 Total Score 2 5  PHQ-9 Total Score 11 16      Flowsheet Row ED from 10/10/2023 in [facility] Department at [facility] ED from 09/30/2023 in [facility] Emergency Department at [facility] ED from 09/21/2023 in [facility] Emergency Department at [facility]  C-SSRS RISK CATEGORY No Risk No Risk No Risk        Assessment and Plan:  Abbie Berling is a 58 y.o. year old female with a history of depression, diabetes, hypertension, who is referred for depression.   1. MDD (major depressive disorder), recurrent episode, mild (HCC) 2. GAD (generalized anxiety disorder) 3. Prolonged grief disorder Acute stressors include:  Other stressors include:  loss of her son in his 39's after being confined in [place]   History: depression, anxiety since loss of her son    Exam is notable for brighter affect, and there has been consistent improvement in depressive symptoms and anxiety since switching from Lexapro to fluoxetine.  She feels comfortable to stay on the current dose for now.  Will continue current dose to target depression and anxiety. Although she will greatly benefit from CBT, she is not interested in this.   # Insomnia - reportedly diagnosed with sleep apnea through hospital sleep test Although she reports some improvement in initial insomnia since starting ramelteon, she continues to have middle insomnia, which she partly attributes to noises. It was noted that she was  reportedly recommended for hospital testing; however, she never received follow-up from the practice despite her attempts to reach out to them.  She agrees to make referral to another practice.  Will continue ramelteon for now to target insomnia.     Plan Continue fluoxetine 20 mg daily  Continue ramelteon 8 mg at night  Referral for sleep evaluation  Next appointment: 4/28 at 1 PM, IP     Past trials of medication- sertraline, lexapro (headache), trazodone, Ambien, ramelteon   The patient demonstrates the following risk factors for suicide: Chronic risk factors for suicide include: psychiatric disorder of depression, anxiety . Acute risk factors for suicide include: loss (financial, interpersonal, professional). Protective factors for this patient include: positive social support, responsibility to others (children, family), coping skills, and hope for the future. Considering these factors, the overall suicide risk at this point appears to be low. Patient is appropriate for outpatient follow up.  Collaboration of Care: Collaboration of Care: Other reviewed notes in Epic  Patient/Guardian was advised Release of Information must be obtained prior to any record release in order to collaborate their care with an outside provider. Patient/Guardian was advised if they have not already done so to contact the registration department to sign all necessary forms in order for [place] to release information regarding their care.   Consent: Patient/Guardian gives verbal consent for treatment and assignment of benefits for services provided during this visit. Patient/Guardian expressed understanding and agreed to proceed.    Neysa Hotter, MD 10/28/2023, 1:45 PM

## 2023-10-28 ENCOUNTER — Ambulatory Visit (INDEPENDENT_AMBULATORY_CARE_PROVIDER_SITE_OTHER): Payer: No Typology Code available for payment source | Admitting: Psychiatry

## 2023-10-28 ENCOUNTER — Encounter: Payer: Self-pay | Admitting: Psychiatry

## 2023-10-28 VITALS — BP 120/76 | HR 66 | Temp 97.8°F | Ht 64.0 in | Wt 191.0 lb

## 2023-10-28 DIAGNOSIS — F330 Major depressive disorder, recurrent, mild: Secondary | ICD-10-CM

## 2023-10-28 DIAGNOSIS — F411 Generalized anxiety disorder: Secondary | ICD-10-CM

## 2023-10-28 DIAGNOSIS — F4381 Prolonged grief disorder: Secondary | ICD-10-CM | POA: Diagnosis not present

## 2023-10-28 DIAGNOSIS — G4700 Insomnia, unspecified: Secondary | ICD-10-CM

## 2023-10-28 DIAGNOSIS — F331 Major depressive disorder, recurrent, moderate: Secondary | ICD-10-CM

## 2023-10-28 MED ORDER — FLUOXETINE HCL 20 MG PO CAPS: 20.0000 mg | ORAL_CAPSULE | Freq: Every day | ORAL | 1 refills | Status: DC

## 2023-10-28 MED ORDER — RAMELTEON 8 MG PO TABS
8.0000 mg | ORAL_TABLET | Freq: Every evening | ORAL | 1 refills | Status: DC | PRN
Start: 2023-11-08 — End: 2023-12-31

## 2023-10-28 NOTE — Patient Instructions (Signed)
 Continue fluoxetine 20 mg daily  Continue ramelteon 8 mg at night  Referral for sleep evaluation  Next appointment: 4/28 at 1 PM

## 2023-11-15 ENCOUNTER — Encounter: Payer: Self-pay | Admitting: Sleep Medicine

## 2023-11-15 ENCOUNTER — Ambulatory Visit: Admitting: Sleep Medicine

## 2023-11-15 VITALS — BP 136/86 | HR 64 | Temp 97.1°F | Ht 64.0 in | Wt 193.6 lb

## 2023-11-15 DIAGNOSIS — F5104 Psychophysiologic insomnia: Secondary | ICD-10-CM

## 2023-11-15 DIAGNOSIS — G4700 Insomnia, unspecified: Secondary | ICD-10-CM

## 2023-11-15 DIAGNOSIS — I10 Essential (primary) hypertension: Secondary | ICD-10-CM | POA: Diagnosis not present

## 2023-11-15 DIAGNOSIS — E119 Type 2 diabetes mellitus without complications: Secondary | ICD-10-CM

## 2023-11-15 DIAGNOSIS — E669 Obesity, unspecified: Secondary | ICD-10-CM | POA: Diagnosis not present

## 2023-11-15 DIAGNOSIS — Z6833 Body mass index (BMI) 33.0-33.9, adult: Secondary | ICD-10-CM

## 2023-11-15 DIAGNOSIS — Z87891 Personal history of nicotine dependence: Secondary | ICD-10-CM

## 2023-11-15 DIAGNOSIS — G4733 Obstructive sleep apnea (adult) (pediatric): Secondary | ICD-10-CM

## 2023-11-15 DIAGNOSIS — E66811 Obesity, class 1: Secondary | ICD-10-CM

## 2023-11-15 NOTE — Patient Instructions (Signed)

## 2023-11-15 NOTE — Progress Notes (Signed)
 Name:Caitlyn [name] MRN: [id number] DOB: 09-Nov-1965   CHIEF COMPLAINT:  ESTABLISH CARE FOR OSA   HISTORY OF PRESENT ILLNESS:  Caitlyn [name] is a 58 y.o. w/ a h/o DMII, hyperlipidemia, HTN, obesity and GERD who presents to establish care for OSA. Reports that she was initially diagnosed with moderate to severe positional OSA (AHI 21, O2 nadir 87%) but did not get started on CPAP therapy at that time. Reports c/o loud snoring and excessive daytime sleepiness. Reports nocturnal awakenings due to unclear reasons and has difficulty falling back to sleep. Reports a 10 lb weight loss over the last few months. Denies morning headaches, RLS symptoms, dream enactment, cataplexy, hypnagogic or hypnapompic hallucinations. Reports a family history of sleep apnea. Denies drowsy driving. Drinks 1 cup of coffee daily, denies tobacco or illicit drug use.   Bedtime 12-1 am Sleep onset 30 mins with Rozerem Rise time 7:30-8 am   EPWORTH SLEEP SCORE 9    11/15/2023    1:00 PM  Results of the Epworth flowsheet  Sitting and reading 1  Watching TV 0  Sitting, inactive in a public place (e.g. a theatre or a meeting) 0  As a passenger in a car for an hour without a break 3  Lying down to rest in the afternoon when circumstances permit 1  Sitting and talking to someone 3  Sitting quietly after a lunch without alcohol 1  In a car, while stopped for a few minutes in traffic 0  Total score 9     PAST MEDICAL HISTORY :   has a past medical history of Anemia, Anemia, Anxiety, Arthritis, Complication of anesthesia, COVID-19, Depression, Dyspepsia, GERD (gastroesophageal reflux disease), Heart burn, Heart murmur, Hypertension, Irregular menstrual cycle, Stress incontinence, T2DM (type 2 diabetes mellitus) (HCC), and Transfusion of blood product refused for religious reason (Jehovah's witness).  has a past surgical history that includes Cholecystectomy, laparoscopic; Laparoscopic ovarian  cystectomy (Right); Tubal ligation; Meniscectomy (Right); Total knee arthroplasty (Right, 07/07/2016); Cholecystectomy; Joint replacement; Knee arthroscopy (Left, 12/03/2016); Abdominal hysterectomy (Left, 01/18/2017); Total knee arthroplasty (Left, 03/16/2017); Knee Closed Reduction (Left, 04/15/2017); Knee arthrotomy (Left, 10/21/2017); Esophagogastroduodenoscopy (N/A, 11/04/2017); Colonoscopy with propofol (N/A, 11/04/2017); Esophagogastroduodenoscopy; Esophagogastroduodenoscopy (egd) with propofol (N/A, 05/30/2019); Anterior and posterior repair (N/A, 06/07/2023); and Bladder suspension (N/A, 06/07/2023). Prior to Admission medications   Medication Sig Start Date End Date Taking? Authorizing Provider  atorvastatin (LIPITOR) 10 MG tablet Take 10 mg by mouth daily.   Yes [provider]  Dulaglutide (TRULICITY) 1.5 MG/0.5ML SOPN Inject into the skin once a week. Takes every Wednesday   Yes [provider]  FLUoxetine (PROZAC) 20 MG capsule Take 1 capsule (20 mg total) by mouth daily. 11/08/23 01/07/24 Yes Hisada, Barbee Cough, MD  lisinopril-hydrochlorothiazide (ZESTORETIC) 20-25 MG tablet Take 1 tablet by mouth daily. 09/28/19  Yes [provider]  metFORMIN (GLUCOPHAGE) 1000 MG tablet Take 1,000 mg by mouth daily with breakfast.   Yes [provider]  metoprolol succinate (TOPROL-XL) 25 MG 24 hr tablet Take 25 mg by mouth daily. 10/09/18  Yes [provider]  pantoprazole (PROTONIX) 40 MG tablet Take 40 mg by mouth 2 (two) times daily as needed.    Yes [provider]  ramelteon (ROZEREM) 8 MG tablet Take 1 tablet (8 mg total) by mouth at bedtime as needed for sleep. 11/08/23 01/07/24 Yes Neysa Hotter, MD  bacitracin ointment Apply to affected area daily Patient not taking: Reported on 11/15/2023 09/21/23 09/20/24  Minna Antis, MD  cyclobenzaprine (FLEXERIL) 5 MG tablet Take 1 tablet (5 mg total) by mouth 3 (three) times daily as needed. Patient not taking: Reported on  11/15/2023 10/10/23   Chesley Noon, MD  HYDROcodone-acetaminophen (NORCO/VICODIN) 5-325 MG tablet Take 1 tablet by mouth every 4 (four) hours as needed. Patient not taking: Reported on 11/15/2023 09/21/23   Minna Antis, MD  Vitamin D, Ergocalciferol, (DRISDOL) 50000 units CAPS capsule Take 50,000 Units by mouth every 7 (seven) days. Patient not taking: Reported on 11/15/2023    [provider]   No Known Allergies  FAMILY HISTORY:  family history includes Diabetes in her father; Heart failure in her father; Hypertension in her father; Osteoarthritis in her mother and sister. SOCIAL HISTORY:  reports that she quit smoking about 8 years ago. Her smoking use included cigarettes. She has never used smokeless tobacco. She reports current alcohol use. She reports that she does not use drugs.   Review of Systems:  Gen:  Denies  fever, sweats, chills weight loss  HEENT: Denies blurred vision, double vision, ear pain, eye pain, hearing loss, nose bleeds, sore throat Cardiac:  No dizziness, chest pain or heaviness, chest tightness,edema, No JVD Resp:   No cough, -sputum production, -shortness of breath,-wheezing, -hemoptysis,  Gi: Denies swallowing difficulty, stomach pain, nausea or vomiting, diarrhea, constipation, bowel incontinence Gu:  Denies bladder incontinence, burning urine Ext:   Denies Joint pain, stiffness or swelling Skin: Denies  skin rash, easy bruising or bleeding or hives Endoc:  Denies polyuria, polydipsia , polyphagia or weight change Psych:   Denies depression, insomnia or hallucinations  Other:  All other systems negative  VITAL SIGNS: BP 136/86 (BP Location: Right Arm, Cuff Size: Normal)   Pulse 64   Temp (!) 97.1 F (36.2 C)   Ht 5\' 4"  (1.626 m)   Wt 193 lb 9.6 oz (87.8 kg)   LMP 01/07/2017 (Exact Date) Comment: tah/bso  SpO2 98%   BMI 33.23 kg/m    Physical Examination:   General Appearance: No distress  EYES PERRLA, EOM intact.   NECK Supple,  No JVD Mallampati IV Pulmonary: normal breath sounds, No wheezing.  CardiovascularNormal S1,S2.  No m/r/g.   Abdomen: Benign, Soft, non-tender. Skin:   warm, no rashes, no ecchymosis  Extremities: normal, no cyanosis, clubbing. Neuro:without focal findings,  speech normal  PSYCHIATRIC: Mood, affect within normal limits.   ASSESSMENT AND PLAN  OSA Reviewed HST results with patient, starting on APAP therapy at 4-16 cm H2O. Discussed the consequences of untreated sleep apnea. Advised not to drive drowsy for safety of patient and others. Will follow up in 3 months to review CPAP efficacy and compliance data.    HTN Stable, on current management. Following with PCP.   DMII Stable, on current management.   Obesity Counseled patient on diet and lifestyle modification.   Insomnia Counseled patient on stimulus control and improving sleep hygiene practices.    Patient  satisfied with Plan of action and management. All questions answered  I spent a total of 30 minutes reviewing chart data, face-to-face evaluation with the patient, counseling and coordination of care as detailed above.    Tempie Hoist, M.D.  Sleep Medicine [facility]             Name:Caitlyn [name] MRN: [id number] DOB: 1966/03/11   CHIEF COMPLAINT:  EXCESSIVE DAYTIME SLEEPINESS   HISTORY OF PRESENT ILLNESS:  @patientname @ is a 58 y.o. w/ a h/o who present for c/o loud snoring and excessive  daytime sleepiness which has been present for several years. Reports nocturnal awakenings due to unclear reasons, however does not have difficulty falling back to sleep. Denies any significant weight changes. Denies morning headaches, RLS symptoms, dream enactment, cataplexy, hypnagogic or hypnapompic hallucinations. Reports a family history of sleep apnea. Denies drowsy driving. Drinks 1-2 sodas daily, occasional alcohol use, former smoker, denies illicit drug use.   Bedtime 11  pm Sleep onset 10 mins Rise time 6:30-7:30 am   EPWORTH SLEEP SCORE 9    11/15/2023    1:00 PM  Results of the Epworth flowsheet  Sitting and reading 1  Watching TV 0  Sitting, inactive in a public place (e.g. a theatre or a meeting) 0  As a passenger in a car for an hour without a break 3  Lying down to rest in the afternoon when circumstances permit 1  Sitting and talking to someone 3  Sitting quietly after a lunch without alcohol 1  In a car, while stopped for a few minutes in traffic 0  Total score 9      PAST MEDICAL HISTORY :   has a past medical history of Anemia, Anemia, Anxiety, Arthritis, Complication of anesthesia, COVID-19, Depression, Dyspepsia, GERD (gastroesophageal reflux disease), Heart burn, Heart murmur, Hypertension, Irregular menstrual cycle, Stress incontinence, T2DM (type 2 diabetes mellitus) (HCC), and Transfusion of blood product refused for religious reason (Jehovah's witness).  has a past surgical history that includes Cholecystectomy, laparoscopic; Laparoscopic ovarian cystectomy (Right); Tubal ligation; Meniscectomy (Right); Total knee arthroplasty (Right, 07/07/2016); Cholecystectomy; Joint replacement; Knee arthroscopy (Left, 12/03/2016); Abdominal hysterectomy (Left, 01/18/2017); Total knee arthroplasty (Left, 03/16/2017); Knee Closed Reduction (Left, 04/15/2017); Knee arthrotomy (Left, 10/21/2017); Esophagogastroduodenoscopy (N/A, 11/04/2017); Colonoscopy with propofol (N/A, 11/04/2017); Esophagogastroduodenoscopy; Esophagogastroduodenoscopy (egd) with propofol (N/A, 05/30/2019); Anterior and posterior repair (N/A, 06/07/2023); and Bladder suspension (N/A, 06/07/2023). Prior to Admission medications   Medication Sig Start Date End Date Taking? Authorizing Provider  atorvastatin (LIPITOR) 10 MG tablet Take 10 mg by mouth daily.   Yes [provider]  Dulaglutide (TRULICITY) 1.5 MG/0.5ML SOPN Inject into the skin once a week. Takes every Wednesday   Yes [provider]  FLUoxetine (PROZAC) 20 MG capsule Take 1 capsule (20 mg total) by mouth daily. 11/08/23 01/07/24 Yes Hisada, Barbee Cough, MD  lisinopril-hydrochlorothiazide (ZESTORETIC) 20-25 MG tablet Take 1 tablet by mouth daily. 09/28/19  Yes [provider]  metFORMIN (GLUCOPHAGE) 1000 MG tablet Take 1,000 mg by mouth daily with breakfast.   Yes [provider]  metoprolol succinate (TOPROL-XL) 25 MG 24 hr tablet Take 25 mg by mouth daily. 10/09/18  Yes [provider]  pantoprazole (PROTONIX) 40 MG tablet Take 40 mg by mouth 2 (two) times daily as needed.    Yes [provider]  ramelteon (ROZEREM) 8 MG tablet Take 1 tablet (8 mg total) by mouth at bedtime as needed for sleep. 11/08/23 01/07/24 Yes Neysa Hotter, MD  bacitracin ointment Apply to affected area daily Patient not taking: Reported on 11/15/2023 09/21/23 09/20/24  Minna Antis, MD  cyclobenzaprine (FLEXERIL) 5 MG tablet Take 1 tablet (5 mg total) by mouth 3 (three) times daily as needed. Patient not taking: Reported on 11/15/2023 10/10/23   Chesley Noon, MD  HYDROcodone-acetaminophen (NORCO/VICODIN) 5-325 MG tablet Take 1 tablet by mouth every 4 (four) hours as needed. Patient not taking: Reported on 11/15/2023 09/21/23   Minna Antis, MD  Vitamin D, Ergocalciferol, (DRISDOL) 50000 units CAPS capsule Take 50,000 Units by mouth every 7 (seven) days. Patient  not taking: Reported on 11/15/2023    [provider]   No Known Allergies  FAMILY HISTORY:  family history includes Diabetes in her father; Heart failure in her father; Hypertension in her father; Osteoarthritis in her mother and sister. SOCIAL HISTORY:  reports that she quit smoking about 8 years ago. Her smoking use included cigarettes. She has never used smokeless tobacco. She reports current alcohol use. She reports that she does not use drugs.   Review of Systems:  Gen:  Denies  fever, sweats, chills weight loss  HEENT: Denies  blurred vision, double vision, ear pain, eye pain, hearing loss, nose bleeds, sore throat Cardiac:  No dizziness, chest pain or heaviness, chest tightness,edema, No JVD Resp:   No cough, -sputum production, -shortness of breath,-wheezing, -hemoptysis,  Gi: Denies swallowing difficulty, stomach pain, nausea or vomiting, diarrhea, constipation, bowel incontinence Gu:  Denies bladder incontinence, burning urine Ext:   Denies Joint pain, stiffness or swelling Skin: Denies  skin rash, easy bruising or bleeding or hives Endoc:  Denies polyuria, polydipsia , polyphagia or weight change Psych:   Denies depression, insomnia or hallucinations  Other:  All other systems negative  VITAL SIGNS: BP 136/86 (BP Location: Right Arm, Cuff Size: Normal)   Pulse 64   Temp (!) 97.1 F (36.2 C)   Ht 5\' 4"  (1.626 m)   Wt 193 lb 9.6 oz (87.8 kg)   LMP 01/07/2017 (Exact Date) Comment: tah/bso  SpO2 98%   BMI 33.23 kg/m    Physical Examination:   General Appearance: No distress  EYES PERRLA, EOM intact.   NECK Supple, No JVD Pulmonary: normal breath sounds, No wheezing.  CardiovascularNormal S1,S2.  No m/r/g.   Abdomen: Benign, Soft, non-tender. Skin:   warm, no rashes, no ecchymosis  Extremities: normal, no cyanosis, clubbing. Neuro:without focal findings,  speech normal  PSYCHIATRIC: Mood, affect within normal limits.   ASSESSMENT AND PLAN  OSA Reviewed HST results with patient. Starting on APAP therapy at 4- suspect that OSA is likely present due to clinical presentation. Discussed the consequences of untreated sleep apnea. Advised not to drive drowsy for safety of patient and others. Will complete further evaluation with a home sleep study and follow up to review results.    HTN Stable, on current management. Following with PCP.   DMII Stable, on current management.   Obesity Counseled patient on diet and lifestyle modification.  Insomnia Counseled patient on stimulus control and  improving sleep hygiene practices.   Patient  satisfied with Plan of action and management. All questions answered   I spent a total of 32 minutes reviewing chart data, face-to-face evaluation with the patient, counseling and coordination of care as detailed above.    Tempie Hoist, M.D.  Sleep Medicine [facility]

## 2023-11-30 ENCOUNTER — Telehealth: Payer: Self-pay

## 2023-11-30 NOTE — Telephone Encounter (Signed)
 Attempted call, NA. LMTCB.   Dr. Betti Cruz advised of need for [name] sleep study. Order placed.    [name], [facility]ne Crocker, [facility] [place], [name]; [place], [facility] [place],  Per note on voided order the patient does not qualify due to needing sleep study within one year. Looks like we spoke to the insurance on 11-15-23 and the order was voided due to the missing sleep study within 1 year.  Patient will need [name] OV referral note and sleep study.  Thank you,  Caitlyn [name]       Previous Messages    ----- Message ----- From: [name], CMA Sent: 11/30/2023   2:42 PM EDT To: [name]  Patient is inquiring about DME order for CPAP. Order placed on 11/15/2023. We do have confirmation of receipt from date 11/16/2023. Patient is Spanish speaker only. Please have bilingual personal contact patient.   Thank you,

## 2023-11-30 NOTE — Addendum Note (Signed)
 Addended by: Tempie Hoist on: 11/30/2023 03:43 PM   Modules accepted: Orders

## 2023-12-01 NOTE — Telephone Encounter (Signed)
 Patient advised that new order for home sleep study was placed. Nothing further needed.

## 2023-12-07 ENCOUNTER — Other Ambulatory Visit: Payer: Self-pay | Admitting: Pulmonary Disease

## 2023-12-07 DIAGNOSIS — R918 Other nonspecific abnormal finding of lung field: Secondary | ICD-10-CM

## 2023-12-14 ENCOUNTER — Encounter: Payer: Self-pay | Admitting: Gastroenterology

## 2023-12-23 NOTE — Progress Notes (Deleted)
 BH MD/PA/NP OP Progress Note  12/23/2023 8:53 AM Caitlyn [name]  MRN:  [id number]  Chief Complaint: No chief complaint on file.  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  She reports beautiful childhood.  Her father was absent, and her mother raised her. She moved to [facility] in 2015 as she wanted a change, and has gotten married with her current husband.  She does not think she can live there anymore due to the heat. She enjoys tranquility and peace here. However, she misses fruits and the beach.    Substance use   Tobacco Alcohol Other substances/  Current   A beer at times denies  Past   denies denies  Past Treatment           Visit Diagnosis: No diagnosis found.  Past Psychiatric History: Please see initial evaluation for full details. I have reviewed the history. No updates at this time.     Past Medical History:  Past Medical History:  Diagnosis Date   Anemia    Anxiety    Arthritis    Complication of anesthesia    a.) anesthesia awareness - has woken up several times during surgery in the past; could not move but could hear   COVID-19    DDD (degenerative disc disease), cervical    DDD (degenerative disc disease), lumbosacral    Depression    Dyspepsia    GERD (gastroesophageal reflux disease)    Heart burn    Heart murmur    Hypertension    Irregular menstrual cycle    Left cervical radiculopathy    Myalgia    Non-rheumatic mitral regurgitation    Non-rheumatic tricuspid valve insufficiency    Nonrheumatic aortic (valve) insufficiency    Stress incontinence    T2DM (type 2 diabetes mellitus) (HCC)    Transfusion of blood product refused for religious reason (Jehovah's witness)    Vitamin D deficiency      Past Surgical History:  Procedure Laterality Date   ABDOMINAL HYSTERECTOMY Left 01/18/2017   Procedure: HYSTERECTOMY ABDOMINAL WITH LEFT SALPINGO OOPHERECTOMY;  Surgeon: Colan Dash, MD;  Location: [facility];  Service: Gynecology;  Laterality: Left;   ANTERIOR AND POSTERIOR REPAIR N/A 06/07/2023   Procedure: ANTERIOR (CYSTOCELE) AND POSTERIOR REPAIR (RECTOCELE) WITH TOT;  Surgeon: Zenobia Hila, MD;  Location: [facility];  Service: Gynecology;  Laterality: N/A;   BLADDER SUSPENSION N/A 06/07/2023   Procedure: TOT;  Surgeon: Zenobia Hila, MD;  Location: [facility];  Service: Gynecology;  Laterality: N/A;   CHOLECYSTECTOMY     CHOLECYSTECTOMY, LAPAROSCOPIC     COLONOSCOPY WITH PROPOFOL  N/A 11/04/2017   Procedure: COLONOSCOPY WITH PROPOFOL ;  Surgeon: Deveron Fly, MD;  Location: [facility] ENDOSCOPY;  Service: Endoscopy;  Laterality: N/A;   ESOPHAGOGASTRODUODENOSCOPY N/A 11/04/2017   Procedure: ESOPHAGOGASTRODUODENOSCOPY (EGD);  Surgeon: Deveron Fly, MD;  Location: [facility] ENDOSCOPY;  Service: Endoscopy;  Laterality: N/A;   ESOPHAGOGASTRODUODENOSCOPY     ESOPHAGOGASTRODUODENOSCOPY (EGD) WITH PROPOFOL  N/A 05/30/2019   Procedure: ESOPHAGOGASTRODUODENOSCOPY (EGD) WITH PROPOFOL ;  Surgeon: Toledo, Alphonsus Jeans, MD;  Location: [facility] ENDOSCOPY;  Service: Gastroenterology;  Laterality: N/A;   INCISION TENDON SHEATH HAND Left    JOINT REPLACEMENT     KNEE ARTHROSCOPY Left 12/03/2016   Procedure: ARTHROSCOPY  KNEE, partial medial menisectomy;  Surgeon: Molli Angelucci, MD;  Location: [facility];  Service: Orthopedics;  Laterality: Left;   KNEE ARTHROTOMY Left 10/21/2017   Procedure: KNEE ARTHROTOMY LEFT LATERAL RELEASE MEDIAL RETINACULART REPAIR POLY EXCHANGE;  Surgeon: Molli Angelucci, MD;  Location: [facility];  Service: Orthopedics;  Laterality: Left;   KNEE CLOSED REDUCTION Left 04/15/2017   Procedure: CLOSED MANIPULATION KNEE;  Surgeon: Molli Angelucci, MD;  Location: [facility];  Service:  Orthopedics;  Laterality: Left;   LAPAROSCOPIC OVARIAN CYSTECTOMY Right    MENISCECTOMY Right    TOTAL KNEE ARTHROPLASTY Right 07/07/2016   Procedure: TOTAL KNEE ARTHROPLASTY;  Surgeon: Molli Angelucci, MD;  Location: [facility];  Service: Orthopedics;  Laterality: Right;   TOTAL KNEE ARTHROPLASTY Left 03/16/2017   Procedure: TOTAL KNEE ARTHROPLASTY;  Surgeon: Molli Angelucci, MD;  Location: [facility];  Service: Orthopedics;  Laterality: Left;   TUBAL LIGATION      Family Psychiatric History: Please see initial evaluation for full details. I have reviewed the history. No updates at this time.     Family History:  Family History  Problem Relation Age of Onset   Osteoarthritis Mother    Heart failure Father    Diabetes Father    Hypertension Father    Osteoarthritis Sister    Breast cancer Neg Hx    Ovarian cancer Neg Hx    Colon cancer Neg Hx     Social History:  Social History   Socioeconomic History   Marital status: Married    Spouse name: Nicholette Barley   Number of children: Not on file   Years of education: Not on file   Highest education level: Not on file  Occupational History   Not on file  Tobacco Use   Smoking status: Former    Current packs/day: 0.00    Types: Cigarettes    Quit date: 03/01/2015    Years since quitting: 8.8   Smokeless tobacco: Never  Vaping Use   Vaping status: Never Used  Substance and Sexual Activity   Alcohol use: Yes    Comment: rarely   Drug use: No   Sexual activity: Not Currently    Partners: Male    Birth control/protection: Surgical    Comment: hysterectomy  Other Topics Concern   Not on file  Social History Narrative   Not on file   Social Drivers of Health   Financial Resource Strain: Low Risk  (12/06/2023)   Received from [facility]   Overall Financial Resource Strain (CARDIA)    Difficulty of Paying Living Expenses: Not very hard  Food Insecurity: No Food Insecurity (12/06/2023)   Received from [facility]   Hunger Vital Sign    Worried About Running Out of Food in the Last Year: Never true    Ran Out of Food in the Last Year: Never true  Transportation Needs: No Transportation Needs (12/06/2023)   Received from [facility] - Transportation    In the past 12 months, has lack of transportation kept you from medical appointments or from getting medications?: No    Lack of Transportation (Non-Medical): No  Physical Activity: Not on file  Stress: Not on file  Social Connections: Not on file    Allergies: No Known Allergies  Metabolic Disorder Labs: No results found for: "HGBA1C", "MPG" No results found for: "PROLACTIN" No results found for: "CHOL", "TRIG", "HDL", "CHOLHDL", "VLDL", "LDLCALC" Lab Results  Component Value Date   TSH  1.612 07/23/2023    Therapeutic Level Labs: No results found for: "LITHIUM" No results found for: "VALPROATE" No results found for: "CBMZ"  Current Medications: Current Outpatient Medications  Medication Sig Dispense Refill   atorvastatin (LIPITOR) 10 MG tablet Take 10 mg by mouth daily.     bacitracin  ointment Apply to affected area daily (Patient not taking: Reported on 11/15/2023) 30 g 0   cyclobenzaprine  (FLEXERIL ) 5 MG tablet Take 1 tablet (5 mg total) by mouth 3 (three) times daily as needed. (Patient not taking: Reported on 11/15/2023) 12 tablet 0   Dulaglutide (TRULICITY) 1.5 MG/0.5ML SOPN Inject into the skin once a week. Takes every Wednesday     FLUoxetine  (PROZAC ) 20 MG capsule Take 1 capsule (20 mg total) by mouth daily. 30 capsule 1   HYDROcodone -acetaminophen  (NORCO/VICODIN) 5-325 MG tablet Take 1 tablet by mouth every 4 (four) hours as needed. (Patient not taking: Reported on 11/15/2023) 15 tablet 0   lisinopril -hydrochlorothiazide (ZESTORETIC) 20-25 MG tablet Take 1 tablet by mouth daily.     metFORMIN (GLUCOPHAGE) 1000 MG tablet Take 1,000 mg by mouth daily with breakfast.     metoprolol succinate  (TOPROL-XL) 25 MG 24 hr tablet Take 25 mg by mouth daily.     pantoprazole  (PROTONIX ) 40 MG tablet Take 40 mg by mouth 2 (two) times daily as needed.      ramelteon  (ROZEREM ) 8 MG tablet Take 1 tablet (8 mg total) by mouth at bedtime as needed for sleep. 30 tablet 1   Vitamin D, Ergocalciferol, (DRISDOL) 50000 units CAPS capsule Take 50,000 Units by mouth every 7 (seven) days. (Patient not taking: Reported on 11/15/2023)     No current facility-administered medications for this visit.     Musculoskeletal: Strength & Muscle Tone: within normal limits Gait & Station: normal Patient leans: N/A  Psychiatric Specialty Exam: Review of Systems  Last menstrual period 01/07/2017.There is no height or weight on file to calculate BMI.  General Appearance: {Appearance:22683}  Eye Contact:  {[facility] EYE CONTACT:22684}  Speech:  Clear and Coherent  Volume:  Normal  Mood:  {B[facility] MOOD:22306}  Affect:  {Affect (PAA):22687}  Thought Process:  Coherent  Orientation:  Full (Time, Place, and Person)  Thought Content: Logical   Suicidal Thoughts:  {ST/HT (PAA):22692}  Homicidal Thoughts:  {ST/HT (PAA):22692}  Memory:  Immediate;   Good  Judgement:  {Judgement (PAA):22694}  Insight:  {Insight (PAA):22695}  Psychomotor Activity:  Normal  Concentration:  Concentration: Good and Attention Span: Good  Recall:  Good  Fund of Knowledge: Good  Language: Good  Akathisia:  No  Handed:  Right  AIMS (if indicated): not done  Assets:  Communication Skills Desire for Improvement  ADL's:  Intact  Cognition: WNL  Sleep:  {[facility] GOOD/FAIR/POOR:22877}   Screenings: GAD-7    Loss adjuster, chartered Office Visit from 09/09/2023 in [facility] Office Visit from 07/12/2023 in [facility]  Total GAD-7 Score 3 19      PHQ2-9    Flowsheet Row Office Visit from 09/09/2023 in [facility] Office Visit from  07/12/2023 in [facility]  PHQ-2 Total Score 2 5  PHQ-9 Total Score 11 16      Flowsheet Row ED from 10/10/2023 in [facility] Department at [facility] ED from 09[facility]30/2023 in [facility] Emergency Department at [facility] ED from 09/21/2023 in [facility] Emergency Department at [facility]  C-SSRS RISK CATEGORY  No Risk No Risk No Risk        Assessment and Plan:  Analea Muller is a 58 y.o. year old female with a history of depression, diabetes, hypertension, who is referred for depression.    1. MDD (major depressive disorder), recurrent episode, mild (HCC) 2. GAD (generalized anxiety disorder) 3. Prolonged grief disorder Acute stressors include:  Other stressors include:  loss of her son in his 70's after being confined in [place]   History: depression, anxiety since loss of her son    Exam is notable for brighter affect, and there has been consistent improvement in depressive symptoms and anxiety since switching from Lexapro  to fluoxetine .  She feels comfortable to stay on the current dose for now.  Will continue current dose to target depression and anxiety. Although she will greatly benefit from CBT, she is not interested in this.    # Insomnia - reportedly diagnosed with sleep apnea through hospital sleep test Although she reports some improvement in initial insomnia since starting ramelteon , she continues to have middle insomnia, which she partly attributes to noises. It was noted that she was reportedly recommended for hospital testing; however, she never received follow-up from the practice despite her attempts to reach out to them.  She agrees to make referral to another practice.  Will continue ramelteon  for now to target insomnia.     Plan Continue fluoxetine  20 mg daily  Continue ramelteon  8 mg at night  Referral for sleep evaluation  Next appointment: 4/28 at 1 PM, IP     Past trials of  medication- sertraline , lexapro  (headache), trazodone , Ambien , ramelteon    The patient demonstrates the following risk factors for suicide: Chronic risk factors for suicide include: psychiatric disorder of depression, anxiety . Acute risk factors for suicide include: loss (financial, interpersonal, professional). Protective factors for this patient include: positive social support, responsibility to others (children, family), coping skills, and hope for the future. Considering these factors, the overall suicide risk at this point appears to be low. Patient is appropriate for outpatient follow up.  Collaboration of Care: Collaboration of Care: {BH OP Collaboration of Care:21014065}  Patient/Guardian was advised Release of Information must be obtained prior to any record release in order to collaborate their care with an outside provider. Patient/Guardian was advised if they have not already done so to contact the registration department to sign all necessary forms in order for us  to release information regarding their care.   Consent: Patient/Guardian gives verbal consent for treatment and assignment of benefits for services provided during this visit. Patient/Guardian expressed understanding and agreed to proceed.    Todd Fossa, MD 12/23/2023, 8:53 AM

## 2023-12-27 ENCOUNTER — Encounter: Payer: Self-pay | Admitting: Gastroenterology

## 2023-12-27 ENCOUNTER — Ambulatory Visit: Payer: 59 | Admitting: Psychiatry

## 2023-12-27 ENCOUNTER — Ambulatory Visit: Admitting: Anesthesiology

## 2023-12-27 ENCOUNTER — Ambulatory Visit
Admission: RE | Admit: 2023-12-27 | Discharge: 2023-12-27 | Disposition: A | Discharge status: Home / Self Care | Attending: Gastroenterology | Admitting: Gastroenterology

## 2023-12-27 ENCOUNTER — Other Ambulatory Visit: Payer: Self-pay

## 2023-12-27 ENCOUNTER — Encounter
Admission: RE | Disposition: A | Discharge status: Home / Self Care | Payer: Self-pay | Source: Home / Self Care | Attending: Gastroenterology

## 2023-12-27 DIAGNOSIS — E119 Type 2 diabetes mellitus without complications: Secondary | ICD-10-CM | POA: Insufficient documentation

## 2023-12-27 DIAGNOSIS — K635 Polyp of colon: Secondary | ICD-10-CM | POA: Diagnosis not present

## 2023-12-27 DIAGNOSIS — F419 Anxiety disorder, unspecified: Secondary | ICD-10-CM | POA: Diagnosis not present

## 2023-12-27 DIAGNOSIS — Q438 Other specified congenital malformations of intestine: Secondary | ICD-10-CM | POA: Insufficient documentation

## 2023-12-27 DIAGNOSIS — I10 Essential (primary) hypertension: Secondary | ICD-10-CM | POA: Insufficient documentation

## 2023-12-27 DIAGNOSIS — R1032 Left lower quadrant pain: Secondary | ICD-10-CM | POA: Insufficient documentation

## 2023-12-27 DIAGNOSIS — F32A Depression, unspecified: Secondary | ICD-10-CM | POA: Insufficient documentation

## 2023-12-27 DIAGNOSIS — Z7984 Long term (current) use of oral hypoglycemic drugs: Secondary | ICD-10-CM | POA: Diagnosis not present

## 2023-12-27 DIAGNOSIS — Z7985 Long-term (current) use of injectable non-insulin antidiabetic drugs: Secondary | ICD-10-CM | POA: Diagnosis not present

## 2023-12-27 DIAGNOSIS — R1011 Right upper quadrant pain: Secondary | ICD-10-CM | POA: Diagnosis not present

## 2023-12-27 DIAGNOSIS — K529 Noninfective gastroenteritis and colitis, unspecified: Secondary | ICD-10-CM | POA: Diagnosis present

## 2023-12-27 DIAGNOSIS — Z9049 Acquired absence of other specified parts of digestive tract: Secondary | ICD-10-CM | POA: Insufficient documentation

## 2023-12-27 HISTORY — DX: Nonrheumatic mitral (valve) insufficiency: I34.0

## 2023-12-27 HISTORY — DX: Nonrheumatic aortic (valve) insufficiency: I35.1

## 2023-12-27 HISTORY — PX: COLONOSCOPY: SHX5424

## 2023-12-27 HISTORY — DX: Nonrheumatic tricuspid (valve) insufficiency: I36.1

## 2023-12-27 HISTORY — DX: Myalgia, unspecified site: M79.10

## 2023-12-27 HISTORY — DX: Radiculopathy, cervical region: M54.12

## 2023-12-27 HISTORY — DX: Other intervertebral disc degeneration, lumbosacral region without mention of lumbar back pain or lower extremity pain: M51.379

## 2023-12-27 HISTORY — DX: Other cervical disc degeneration, unspecified cervical region: M50.30

## 2023-12-27 HISTORY — DX: Vitamin D deficiency, unspecified: E55.9

## 2023-12-27 LAB — GLUCOSE, CAPILLARY: Glucose-Capillary: 106 mg/dL — ABNORMAL HIGH (ref 70–99)

## 2023-12-27 SURGERY — COLONOSCOPY
Anesthesia: General

## 2023-12-27 MED ORDER — PROPOFOL 10 MG/ML IV BOLUS
INTRAVENOUS | Status: DC | PRN
  Administered 2023-12-27: 100 mg via INTRAVENOUS

## 2023-12-27 MED ORDER — PROPOFOL 500 MG/50ML IV EMUL
INTRAVENOUS | Status: DC | PRN
  Administered 2023-12-27: 150 ug/kg/min via INTRAVENOUS

## 2023-12-27 MED ORDER — SODIUM CHLORIDE 0.9 % IV SOLN: INTRAVENOUS | Status: DC

## 2023-12-27 MED ORDER — PROPOFOL 1000 MG/100ML IV EMUL
INTRAVENOUS | Status: AC
  Filled 2023-12-27: qty 100

## 2023-12-27 NOTE — Anesthesia Preprocedure Evaluation (Signed)
 Anesthesia Evaluation  Patient identified by MRN, date of birth, ID band Patient awake  General Assessment Comment:  Hx awareness during ovarian surgery in [place] > 15 years ago  Reviewed: Allergy & Precautions, NPO status , Patient's Chart, lab work & pertinent test results  History of Anesthesia Complications (+) AWARENESS UNDER ANESTHESIA and history of anesthetic complications  Airway Mallampati: II  TM Distance: >3 FB Neck ROM: Full    Dental no notable dental hx. (+) Teeth Intact   Pulmonary neg pulmonary ROS, neg sleep apnea, neg COPD, Patient abstained from smoking.Not current smoker, former smoker   Pulmonary exam normal breath sounds clear to auscultation       Cardiovascular Exercise Tolerance: Good METShypertension, Pt. on medications (-) CAD and (-) Past MI (-) dysrhythmias  Rhythm:Regular Rate:Normal - Systolic murmurs    Neuro/Psych  PSYCHIATRIC DISORDERS Anxiety Depression     Neuromuscular disease    GI/Hepatic ,GERD  ,,(+)     (-) substance abuse    Endo/Other  diabetes  Trulicity > 7 days ago  Renal/GU negative Renal ROS     Musculoskeletal   Abdominal   Peds  Hematology   Anesthesia Other Findings Past Medical History: No date: Anemia No date: Anxiety No date: Arthritis No date: Complication of anesthesia     Comment:  a.) anesthesia awareness - has woken up several times               during surgery in the past; could not move but could hear No date: COVID-19 No date: DDD (degenerative disc disease), cervical No date: DDD (degenerative disc disease), lumbosacral No date: Depression No date: Dyspepsia No date: GERD (gastroesophageal reflux disease) No date: Heart burn No date: Heart murmur No date: Hypertension No date: Irregular menstrual cycle No date: Left cervical radiculopathy No date: Myalgia No date: Non-rheumatic mitral regurgitation No date: Non-rheumatic tricuspid  valve insufficiency No date: Nonrheumatic aortic (valve) insufficiency No date: Stress incontinence No date: T2DM (type 2 diabetes mellitus) (HCC) No date: Transfusion of blood product refused for religious reason  (Jehovah's witness) No date: Vitamin D deficiency  Reproductive/Obstetrics                             Anesthesia Physical Anesthesia Plan  ASA: 2  Anesthesia Plan: General   Post-op Pain Management: Minimal or no pain anticipated   Induction: Intravenous  PONV Risk Score and Plan: 3 and Propofol  infusion, TIVA and Ondansetron   Airway Management Planned: Nasal Cannula  Additional Equipment: None  Intra-op Plan:   Post-operative Plan:   Informed Consent: I have reviewed the patients History and Physical, chart, labs and discussed the procedure including the risks, benefits and alternatives for the proposed anesthesia with the patient or authorized representative who has indicated his/her understanding and acceptance.     Dental advisory given and Interpreter used for interview  Plan Discussed with: CRNA and Surgeon  Anesthesia Plan Comments: (Discussed risks of anesthesia with patient, including possibility of difficulty with spontaneous ventilation under anesthesia necessitating airway intervention, PONV, and rare risks such as cardiac or respiratory or neurological events, and allergic reactions. Discussed the role of CRNA in patient's perioperative care. Patient understands.)       Anesthesia Quick Evaluation

## 2023-12-27 NOTE — H&P (Addendum)
 Pre-Procedure H&P   Patient ID: Caitlyn [name] is a 58 y.o. female.  Gastroenterology Provider: Quintin Buckle, DO  Referring Provider: Jamee Mazzoni, PA PCP: Kenton Pearl, MD  Date: 12/27/2023  HPI Caitlyn [name] is a 58 y.o. female who presents today for Colonoscopy for Left upper quadrant pain, diarrhea.  Information garnered via video medical Spanish interpreter Eliberto Grosser [phone])  Patient with cramping that is relieved by defecation.  Bentyl helps as well.  Appetite and weight have been stable.  The CT in February unremarkable for etiology of abdominal pain.  Only fat liver disease demonstrated.  Patient is status post cholecystectomy.  Creatinine 0.7 hemoglobin 13.1 MCV 82 platelets 348,000  Patient on Trulicity was been held for the procedure (last dose 2 weeks ago)  Last EGD and colonoscopy in 2019-normal aside from hyperplastic polyps in the colon   Past Medical History:  Diagnosis Date   Anemia    Anxiety    Arthritis    Complication of anesthesia    a.) anesthesia awareness - has woken up several times during surgery in the past; could not move but could hear   COVID-19    DDD (degenerative disc disease), cervical    DDD (degenerative disc disease), lumbosacral    Depression    Dyspepsia    GERD (gastroesophageal reflux disease)    Heart burn    Heart murmur    Hypertension    Irregular menstrual cycle    Left cervical radiculopathy    Myalgia    Non-rheumatic mitral regurgitation    Non-rheumatic tricuspid valve insufficiency    Nonrheumatic aortic (valve) insufficiency    Stress incontinence    T2DM (type 2 diabetes mellitus) (HCC)    Transfusion of blood product refused for religious reason (Jehovah's witness)    Vitamin D deficiency     Past Surgical History:  Procedure Laterality Date   ABDOMINAL HYSTERECTOMY Left 01/18/2017   Procedure: HYSTERECTOMY ABDOMINAL WITH LEFT SALPINGO OOPHERECTOMY;  Surgeon:  Colan Dash, MD;  Location: [facility] ORS;  Service: Gynecology;  Laterality: Left;   ANTERIOR AND POSTERIOR REPAIR N/A 06/07/2023   Procedure: ANTERIOR (CYSTOCELE) AND POSTERIOR REPAIR (RECTOCELE) WITH TOT;  Surgeon: Zenobia Hila, MD;  Location: [facility];  Service: Gynecology;  Laterality: N/A;   BLADDER SUSPENSION N/A 06/07/2023   Procedure: TOT;  Surgeon: Zenobia Hila, MD;  Location: [facility];  Service: Gynecology;  Laterality: N/A;   CHOLECYSTECTOMY     CHOLECYSTECTOMY, LAPAROSCOPIC     COLONOSCOPY WITH PROPOFOL  N/A 11/04/2017   Procedure: COLONOSCOPY WITH PROPOFOL ;  Surgeon: Deveron Fly, MD;  Location: [facility] ENDOSCOPY;  Service: Endoscopy;  Laterality: N/A;   ESOPHAGOGASTRODUODENOSCOPY N/A 11/04/2017   Procedure: ESOPHAGOGASTRODUODENOSCOPY (EGD);  Surgeon: Deveron Fly, MD;  Location: [facility] ENDOSCOPY;  Service: Endoscopy;  Laterality: N/A;   ESOPHAGOGASTRODUODENOSCOPY     ESOPHAGOGASTRODUODENOSCOPY (EGD) WITH PROPOFOL  N/A 05/30/2019   Procedure: ESOPHAGOGASTRODUODENOSCOPY (EGD) WITH PROPOFOL ;  Surgeon: Toledo, Alphonsus Jeans, MD;  Location: [facility] ENDOSCOPY;  Service: Gastroenterology;  Laterality: N/A;   INCISION TENDON SHEATH HAND Left    JOINT REPLACEMENT     KNEE ARTHROSCOPY Left 12/03/2016   Procedure: ARTHROSCOPY KNEE, partial medial menisectomy;  Surgeon: Molli Angelucci, MD;  Location: [facility];  Service: Orthopedics;  Laterality: Left;   KNEE ARTHROTOMY Left 10/21/2017   Procedure: KNEE ARTHROTOMY LEFT LATERAL RELEASE MEDIAL RETINACULART REPAIR POLY EXCHANGE;  Surgeon: Molli Angelucci, MD;  Location: [facility];  Service: Orthopedics;  Laterality: Left;   KNEE CLOSED  REDUCTION Left 04/15/2017   Procedure: CLOSED MANIPULATION KNEE;  Surgeon: Molli Angelucci, MD;  Location: [facility];  Service: Orthopedics;  Laterality: Left;   LAPAROSCOPIC OVARIAN CYSTECTOMY Right    MENISCECTOMY Right    TOTAL KNEE ARTHROPLASTY Right 07/07/2016   Procedure: TOTAL KNEE ARTHROPLASTY;   Surgeon: Molli Angelucci, MD;  Location: [facility];  Service: Orthopedics;  Laterality: Right;   TOTAL KNEE ARTHROPLASTY Left 03/16/2017   Procedure: TOTAL KNEE ARTHROPLASTY;  Surgeon: Molli Angelucci, MD;  Location: [facility];  Service: Orthopedics;  Laterality: Left;   TUBAL LIGATION      Family History No h/o GI disease or malignancy  Review of Systems  Constitutional:  Negative for activity change, appetite change, chills, diaphoresis, fatigue, fever and unexpected weight change.  HENT:  Negative for trouble swallowing and voice change.   Respiratory:  Negative for shortness of breath and wheezing.   Cardiovascular:  Negative for chest pain, palpitations and leg swelling.  Gastrointestinal:  Positive for abdominal pain and diarrhea. Negative for abdominal distention, anal bleeding, blood in stool, constipation, nausea, rectal pain and vomiting.  Musculoskeletal:  Negative for arthralgias and myalgias.  Skin:  Negative for color change and pallor.  Neurological:  Negative for dizziness, syncope and weakness.  Psychiatric/Behavioral:  Negative for confusion.   All other systems reviewed and are negative.    Medications No current facility-administered medications on file prior to encounter.   Current Outpatient Medications on File Prior to Encounter  Medication Sig Dispense Refill   albuterol  (VENTOLIN  HFA) 108 (90 Base) MCG/ACT inhaler Inhale into the lungs every 6 (six) hours as needed for wheezing or shortness of breath.     atorvastatin (LIPITOR) 10 MG tablet Take 10 mg by mouth daily.     celecoxib (CELEBREX) 200 MG capsule Take 200 mg by mouth 2 (two) times daily.     escitalopram  (LEXAPRO ) 10 MG tablet Take 10 mg by mouth daily.     famotidine  (PEPCID ) 20 MG tablet Take 20 mg by mouth 2 (two) times daily.     FLUoxetine  (PROZAC ) 20 MG capsule Take 1 capsule (20 mg total) by mouth daily. 30 capsule 1   lisinopril -hydrochlorothiazide (ZESTORETIC) 20-25 MG tablet Take 1 tablet by  mouth daily.     metFORMIN (GLUCOPHAGE) 1000 MG tablet Take 1,000 mg by mouth daily with breakfast.     metoprolol succinate (TOPROL-XL) 25 MG 24 hr tablet Take 25 mg by mouth daily.     bacitracin  ointment Apply to affected area daily (Patient not taking: Reported on 11/15/2023) 30 g 0   cyclobenzaprine  (FLEXERIL ) 5 MG tablet Take 1 tablet (5 mg total) by mouth 3 (three) times daily as needed. (Patient not taking: Reported on 11/15/2023) 12 tablet 0   Dulaglutide (TRULICITY) 1.5 MG/0.5ML SOPN Inject into the skin once a week. Takes every Wednesday     HYDROcodone -acetaminophen  (NORCO/VICODIN) 5-325 MG tablet Take 1 tablet by mouth every 4 (four) hours as needed. (Patient not taking: Reported on 11/15/2023) 15 tablet 0   pantoprazole  (PROTONIX ) 40 MG tablet Take 40 mg by mouth 2 (two) times daily as needed.      ramelteon  (ROZEREM ) 8 MG tablet Take 1 tablet (8 mg total) by mouth at bedtime as needed for sleep. 30 tablet 1   Vitamin D, Ergocalciferol, (DRISDOL) 50000 units CAPS capsule Take 50,000 Units by mouth every 7 (seven) days. (Patient not taking: Reported on 11/15/2023)      Pertinent medications related to GI and procedure were reviewed by  me with the patient prior to the procedure   Current Facility-Administered Medications:    0.9 %  sodium chloride  infusion, , Intravenous, Continuous, Quintin Buckle, DO, Last Rate: 20 mL/hr at 12/27/23 0935, New Bag at 12/27/23 0935  sodium chloride  20 mL/hr at 12/27/23 0935       No Known Allergies Allergies were reviewed by me prior to the procedure  Objective   Body mass index is 33.2 kg/m. Vitals:   12/27/23 0922  BP: (!) 173/96  Pulse: 63  Resp: 18  Temp: (!) 96.7 F (35.9 C)  TempSrc: Temporal  SpO2: 100%  Weight: 87.7 kg  Height: 5\' 4"  (1.626 m)     Physical Exam Vitals and nursing note reviewed.  Constitutional:      General: She is not in acute distress.    Appearance: Normal appearance. She is not ill-appearing,  toxic-appearing or diaphoretic.  HENT:     Head: Normocephalic and atraumatic.     Nose: Nose normal.     Mouth/Throat:     Mouth: Mucous membranes are moist.     Pharynx: Oropharynx is clear.  Eyes:     General: No scleral icterus.    Extraocular Movements: Extraocular movements intact.  Cardiovascular:     Rate and Rhythm: Normal rate and regular rhythm.     Heart sounds: Normal heart sounds. No murmur heard.    No friction rub. No gallop.  Pulmonary:     Effort: Pulmonary effort is normal. No respiratory distress.     Breath sounds: Normal breath sounds. No wheezing, rhonchi or rales.  Abdominal:     General: Bowel sounds are normal. There is no distension.     Palpations: Abdomen is soft.     Tenderness: There is no abdominal tenderness. There is no guarding or rebound.  Musculoskeletal:     Cervical back: Neck supple.     Right lower leg: No edema.     Left lower leg: No edema.  Skin:    General: Skin is warm and dry.     Coloration: Skin is not jaundiced or pale.  Neurological:     General: No focal deficit present.     Mental Status: She is alert and oriented to person, place, and time. Mental status is at baseline.  Psychiatric:        Mood and Affect: Mood normal.        Behavior: Behavior normal.        Thought Content: Thought content normal.        Judgment: Judgment normal.      Assessment:  Ms. Tehani Tedder is a 58 y.o. female  who presents today for Esophagogastroduodenoscopy and Colonoscopy for Left upper quadrant pain, diarrhea.  Plan:  Esophagogastroduodenoscopy and Colonoscopy with possible intervention today  Esophagogastroduodenoscopy and Colonoscopy with possible biopsy, control of bleeding, polypectomy, and interventions as necessary has been discussed with the patient/patient representative. Informed consent was obtained from the patient/patient representative after explaining the indication, nature, and risks of the procedure including  but not limited to death, bleeding, perforation, missed neoplasm/lesions, cardiorespiratory compromise, and reaction to medications. Opportunity for questions was given and appropriate answers were provided. Patient/patient representative has verbalized understanding is amenable to undergoing the procedure.   Quintin Buckle, DO  [facility]astro[facility]  Portions of the record may have been created with voice recognition software. Occasional wrong-word or 'sound-a-like' substitutions may have occurred due to the inherent limitations of voice recognition software.  Read the  chart carefully and recognize, using context, where substitutions may have occurred.

## 2023-12-27 NOTE — Anesthesia Postprocedure Evaluation (Signed)
 Anesthesia Post Note  Patient: Caitlyn [name]  Procedure(s) Performed: COLONOSCOPY  Patient location during evaluation: Endoscopy Anesthesia Type: General Level of consciousness: awake and alert Pain management: pain level controlled Vital Signs Assessment: post-procedure vital signs reviewed and stable Respiratory status: spontaneous breathing, nonlabored ventilation, respiratory function stable and patient connected to nasal cannula oxygen Cardiovascular status: blood pressure returned to baseline and stable Postop Assessment: no apparent nausea or vomiting Anesthetic complications: no   There were no known notable events for this encounter.   Last Vitals:  Vitals:   12/27/23 1015 12/27/23 1035  BP: (P) 107/69 (!) (P) 150/90  Pulse:    Resp:    Temp: (!) (P) 35.9 C   SpO2:      Last Pain:  Vitals:   12/27/23 1015  TempSrc: (P) Temporal  PainSc: (P) 0-No pain                 Lattie Poli

## 2023-12-27 NOTE — Interval H&P Note (Signed)
 History and Physical Interval Note: Preprocedure H&P from 12/27/23  was reviewed and there was no interval change after seeing and examining the patient.  Written consent was obtained from the patient after discussion of risks, benefits, and alternatives. Patient has consented to proceed with Colonoscopy with possible intervention   12/27/2023 9:46 AM  Caitlyn [name]  has presented today for surgery, with the diagnosis of R10.12, G89.29 (ICD-10-CM) - Abdominal pain, chronic, left upper quadrant K59.1 (ICD-10-CM) - Diarrhea, functional.  The various methods of treatment have been discussed with the patient and family. After consideration of risks, benefits and other options for treatment, the patient has consented to  Procedure(s) with comments: COLONOSCOPY (N/A) - DM  (Spanish Interpreter needed) as a surgical intervention.  The patient's history has been reviewed, patient examined, no change in status, stable for surgery.  I have reviewed the patient's chart and labs.  Questions were answered to the patient's satisfaction.     Quintin Buckle

## 2023-12-27 NOTE — Transfer of Care (Signed)
 Immediate Anesthesia Transfer of Care Note  Patient: Caitlyn [name]  Procedure(s) Performed: COLONOSCOPY  Patient Location: PACU  Anesthesia Type:General  Level of Consciousness: awake and sedated  Airway & Oxygen Therapy: Patient Spontanous Breathing and Patient connected to nasal cannula oxygen  Post-op Assessment: Report given to RN and Post -op Vital signs reviewed and stable  Post vital signs: Reviewed and stable  Last Vitals:  Vitals Value Taken Time  BP    Temp    Pulse    Resp    SpO2      Last Pain:  Vitals:   12/27/23 0922  TempSrc: Temporal  PainSc: 0-No pain         Complications: There were no known notable events for this encounter.

## 2023-12-27 NOTE — Op Note (Signed)
 [facility] Gastroenterology Patient Name: Caitlyn [name] Procedure Date: 12/27/2023 9:10 AM MRN: [id number] Account #: [id number] Date of Birth: November 11, 1965 Admit Type: Outpatient Age: 58 Room: [facility] ENDO ROOM 2 Gender: Female Note Status: Finalized Instrument Name: Charlyn Cooley [id number] Procedure:             Colonoscopy Indications:           Chronic diarrhea, Abdominal pain in the left lower                         quadrant, Abdominal pain in the left upper quadrant Providers:             Quintin Buckle DO, DO Medicines:             Monitored Anesthesia Care Complications:         No immediate complications. Estimated blood loss:                         Minimal. Procedure:             Pre-Anesthesia Assessment:                        - Prior to the procedure, a History and Physical was                         performed, and patient medications and allergies were                         reviewed. The patient is competent. The risks and                         benefits of the procedure and the sedation options and                         risks were discussed with the patient. All questions                         were answered and informed consent was obtained.                         Patient identification and proposed procedure were                         verified by the physician, the nurse, the anesthetist                         and the technician in the endoscopy suite. Mental                         Status Examination: alert and oriented. Airway                         Examination: normal oropharyngeal airway and neck                         mobility. Respiratory Examination: clear to                         auscultation. CV Examination: RRR, no  murmurs, no S3                         or S4. Prophylactic Antibiotics: The patient does not                         require prophylactic antibiotics. Prior                         Anticoagulants: The  patient has taken no anticoagulant                         or antiplatelet agents. ASA Grade Assessment: II - A                         patient with mild systemic disease. After reviewing                         the risks and benefits, the patient was deemed in                         satisfactory condition to undergo the procedure. The                         anesthesia plan was to use monitored anesthesia care                         (MAC). Immediately prior to administration of                         medications, the patient was re-assessed for adequacy                         to receive sedatives. The heart rate, respiratory                         rate, oxygen saturations, blood pressure, adequacy of                         pulmonary ventilation, and response to care were                         monitored throughout the procedure. The physical                         status of the patient was re-assessed after the                         procedure.                        After obtaining informed consent, the colonoscope was                         passed under direct vision. Throughout the procedure,                         the patient's blood pressure, pulse, and oxygen  saturations were monitored continuously. The                         Colonoscope was introduced through the anus and                         advanced to the the terminal ileum, with                         identification of the appendiceal orifice and IC                         valve. The colonoscopy was performed without                         difficulty. The patient tolerated the procedure well.                         The quality of the bowel preparation was evaluated                         using the BBPS [facility] Bowel Preparation Scale) with                         scores of: Right Colon = 2 (minor amount of residual                         staining, small fragments of stool and/or opaque                          liquid, but mucosa seen well), Transverse Colon = 2                         (minor amount of residual staining, small fragments of                         stool and/or opaque liquid, but mucosa seen well) and                         Left Colon = 3 (entire mucosa seen well with no                         residual staining, small fragments of stool or opaque                         liquid). The total BBPS score equals 7. The quality of                         the bowel preparation was good. The terminal ileum,                         ileocecal valve, appendiceal orifice, and rectum were                         photographed. Findings:      The perianal and digital rectal examinations were normal. Pertinent       negatives include normal sphincter  tone.      The terminal ileum appeared normal. Estimated blood loss: none.      Two sessile polyps were found in the recto-sigmoid colon. The polyps       were 1 to 2 mm in size. These polyps were removed with a jumbo cold       forceps. Resection and retrieval were complete. Appearingly hyperplastic       in nature. Estimated blood loss was minimal.      The sigmoid colon was mildly tortuous. Estimated blood loss: none.      Normal mucosa was found in the entire colon. Biopsies for histology were       taken with a cold forceps from the right colon, left colon and       transverse colon for evaluation of microscopic colitis. Estimated blood       loss was minimal.      The exam was otherwise without abnormality on direct and retroflexion       views. Impression:            - The examined portion of the ileum was normal.                        - Two 1 to 2 mm polyps at the recto-sigmoid colon,                         removed with a jumbo cold forceps. Resected and                         retrieved.                        - Tortuous colon.                        - Normal mucosa in the entire examined colon. Biopsied.                         - The examination was otherwise normal on direct and                         retroflexion views.                        - - La porcin examinada del leon fue normal.                        - Dos plipos de 1 a 2 mm en el colon rectosigmoide,                         extirpados con pinzas fras jumbo. Resecados y                         recuperados.                        - Colon tortuoso.                        - Mucosa normal en todo el colon examinado. Biopsiado.                        -  Por lo dems, el examen fue normal en proyecciones                         directas y en retroflexin. Recommendation:        - Patient has a contact number available for                         emergencies. The signs and symptoms of potential                         delayed complications were discussed with the patient.                         Return to normal activities tomorrow. Written                         discharge instructions were provided to the patient.                        - Discharge patient to home.                        - Resume previous diet.                        - Continue present medications.                        - Await pathology results.                        - Repeat colonoscopy for surveillance based on                         pathology results.                        - Return to GI office as previously scheduled.                        - Consider bile acid sequestrant for diarrhea if                         microscopic colitis biopsies negative.                        - The findings and recommendations were discussed with                         the patient. 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 All rights reserved. The codes documented in this report are preliminary and upon coder review may  be revised to meet current compliance requirements. Attending Participation:      I personally performed the entire procedure. Polo Brisk, DO Quintin Buckle DO, DO 12/27/2023 10:18:04 AM This report has been signed electronically. Number of Addenda: 0 Note Initiated On: 12/27/2023 9:10 AM Scope Withdrawal Time: 0 hours 10 minutes 39 seconds  Total Procedure Duration: 0 hours 16 minutes 39 seconds  Estimated Blood Loss:  Estimated blood loss was minimal.      [facility]

## 2023-12-28 ENCOUNTER — Ambulatory Visit
Admission: RE | Admit: 2023-12-28 | Discharge: 2023-12-28 | Disposition: A | Discharge status: Home / Self Care | Source: Ambulatory Visit | Attending: Pulmonary Disease | Admitting: Pulmonary Disease

## 2023-12-28 ENCOUNTER — Ambulatory Visit

## 2023-12-28 DIAGNOSIS — R918 Other nonspecific abnormal finding of lung field: Secondary | ICD-10-CM | POA: Diagnosis present

## 2023-12-28 DIAGNOSIS — I700 Atherosclerosis of aorta: Secondary | ICD-10-CM | POA: Diagnosis not present

## 2023-12-28 DIAGNOSIS — Z87891 Personal history of nicotine dependence: Secondary | ICD-10-CM | POA: Diagnosis not present

## 2023-12-28 DIAGNOSIS — Z122 Encounter for screening for malignant neoplasm of respiratory organs: Secondary | ICD-10-CM | POA: Insufficient documentation

## 2023-12-28 LAB — SURGICAL PATHOLOGY

## 2023-12-31 ENCOUNTER — Other Ambulatory Visit: Payer: Self-pay | Admitting: Psychiatry

## 2023-12-31 NOTE — Telephone Encounter (Signed)
 Please contact her to schedule a follow-up appointment and arrange for a Spanish interpreter.

## 2024-01-03 NOTE — Telephone Encounter (Signed)
 Appointment scheduled  for 01-06-24 and interpreter arranged

## 2024-01-06 ENCOUNTER — Encounter: Payer: Self-pay | Admitting: Psychiatry

## 2024-01-06 ENCOUNTER — Ambulatory Visit (INDEPENDENT_AMBULATORY_CARE_PROVIDER_SITE_OTHER): Admitting: Psychiatry

## 2024-01-06 ENCOUNTER — Other Ambulatory Visit: Payer: Self-pay

## 2024-01-06 VITALS — BP 165/106 | HR 67 | Temp 97.1°F | Ht 64.0 in | Wt 194.8 lb

## 2024-01-06 DIAGNOSIS — F411 Generalized anxiety disorder: Secondary | ICD-10-CM | POA: Diagnosis not present

## 2024-01-06 DIAGNOSIS — F330 Major depressive disorder, recurrent, mild: Secondary | ICD-10-CM | POA: Diagnosis not present

## 2024-01-06 DIAGNOSIS — F4381 Prolonged grief disorder: Secondary | ICD-10-CM

## 2024-01-06 DIAGNOSIS — G4700 Insomnia, unspecified: Secondary | ICD-10-CM

## 2024-01-06 MED ORDER — RAMELTEON 8 MG PO TABS
8.0000 mg | ORAL_TABLET | Freq: Every evening | ORAL | 0 refills | Status: AC | PRN
Start: 2024-01-30 — End: 2024-03-30

## 2024-01-06 MED ORDER — FLUOXETINE HCL 40 MG PO CAPS: 40.0000 mg | ORAL_CAPSULE | Freq: Every day | ORAL | 1 refills | Status: DC

## 2024-01-06 NOTE — Progress Notes (Signed)
 BH MD/PA/NP OP Progress Note  01/06/2024 5:26 PM Caitlyn [name]  MRN:  [id number]  Chief Complaint:  Chief Complaint  Patient presents with   Follow-up   HPI:  This is a follow-up appointment for depression, anxiety and insomnia.  The interview was conducted with a Spanish interpreter.  She states that she has been doing good.  There has been no change.  She enjoyed visiting her nephews in [place].  She takes care of things in the house since coming back.  She states that everything is good.  She states her mother and sister.  She reports fair relationship with her husband, stating that nothing is perfect.  She enjoys taking care of chicken.  She enjoys planting.  However, she feels tired at times.  There are days she does not want to do anything.  She asked about her son, she states that she misses him.  She has occasional days of crying. The patient has mood symptoms as in PHQ-9/GAD-7.  She denies SI. She has initial and middle insomnia.  Although she tries to make her feel tired to go to sleep, it has been difficult. She feels her mind is busy. Her appetite fluctuates.  She denies SI.  She denies hallucinations.  She denies panic attacks.  She drinks once a month or less.  She denies drug use.  She agrees with the plan as outlined below.     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  She reports beautiful childhood.  Her father was absent, and her mother raised her. She moved to [facility] in 2015 as she wanted a change, and has gotten married with her current husband.  She does not think she can live there anymore  due to the heat. She enjoys tranquility and peace here. However, she misses fruits and the beach.    Substance use   Tobacco Alcohol Other substances/  Current   A beer at times denies  Past   denies denies  Past Treatment           Visit Diagnosis:    ICD-10-CM   1. MDD (major depressive disorder), recurrent episode, mild (HCC)  F33.0     2. GAD (generalized anxiety disorder)  F41.1     3. 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 Insomnia, unspecified type  G47.00       Past Psychiatric History: Please see initial evaluation for full details. I have reviewed the history. No updates at this time.     Past Medical History:  Past Medical History:  Diagnosis Date   Anemia    Anxiety    Arthritis    Complication of anesthesia    a.) anesthesia awareness - has woken up several times during surgery in the past; could not move but could hear   COVID-19    DDD (degenerative disc disease), cervical    DDD (degenerative disc disease), lumbosacral    Depression    Dyspepsia    GERD (gastroesophageal reflux disease)    Heart burn    Heart murmur  Hypertension    Irregular menstrual cycle    Left cervical radiculopathy    Myalgia    Non-rheumatic mitral regurgitation    Non-rheumatic tricuspid valve insufficiency    Nonrheumatic aortic (valve) insufficiency    Stress incontinence    T2DM (type 2 diabetes mellitus) (HCC)    Transfusion of blood product refused for religious reason (Jehovah's witness)    Vitamin D deficiency     Past Surgical History:  Procedure Laterality Date   ABDOMINAL HYSTERECTOMY Left 01/18/2017   Procedure: HYSTERECTOMY ABDOMINAL WITH LEFT SALPINGO OOPHERECTOMY;  Surgeon: Colan Dash, MD;  Location: [facility] ORS;  Service: Gynecology;  Laterality: Left;   ANTERIOR AND POSTERIOR REPAIR N/A 06/07/2023   Procedure: ANTERIOR (CYSTOCELE) AND POSTERIOR REPAIR (RECTOCELE) WITH TOT;  Surgeon: Zenobia Hila, MD;  Location: [facility];  Service:  Gynecology;  Laterality: N/A;   BLADDER SUSPENSION N/A 06/07/2023   Procedure: TOT;  Surgeon: Zenobia Hila, MD;  Location: [facility];  Service: Gynecology;  Laterality: N/A;   CHOLECYSTECTOMY     CHOLECYSTECTOMY, LAPAROSCOPIC     COLONOSCOPY N/A 12/27/2023   Procedure: COLONOSCOPY;  Surgeon: Quintin Buckle, DO;  Location: [facility]COPY;  Service: Gastroenterology;  Laterality: N/A;  DM  (Spanish Interpreter needed)   COLONOSCOPY WITH PROPOFOL  N/A 11/04/2017   Procedure: COLONOSCOPY WITH PROPOFOL ;  Surgeon: Deveron Fly, MD;  Location: [facility] ENDOSCOPY;  Service: Endoscopy;  Laterality: N/A;   ESOPHAGOGASTRODUODENOSCOPY N/A 11/04/2017   Procedure: ESOPHAGOGASTRODUODENOSCOPY (EGD);  Surgeon: Deveron Fly, MD;  Location: [facility] ENDOSCOPY;  Service: Endoscopy;  Laterality: N/A;   ESOPHAGOGASTRODUODENOSCOPY     ESOPHAGOGASTRODUODENOSCOPY (EGD) WITH PROPOFOL  N/A 05/30/2019   Procedure: ESOPHAGOGASTRODUODENOSCOPY (EGD) WITH PROPOFOL ;  Surgeon: Toledo, Alphonsus Jeans, MD;  Location: [facility] ENDOSCOPY;  Service: Gastroenterology;  Laterality: N/A;   INCISION TENDON SHEATH HAND Left    JOINT REPLACEMENT     KNEE ARTHROSCOPY Left 12/03/2016   Procedure: ARTHROSCOPY KNEE, partial medial menisectomy;  Surgeon: Molli Angelucci, MD;  Location: [facility];  Service: Orthopedics;  Laterality: Left;   KNEE ARTHROTOMY Left 10/21/2017   Procedure: KNEE ARTHROTOMY LEFT LATERAL RELEASE MEDIAL RETINACULART REPAIR POLY EXCHANGE;  Surgeon: Molli Angelucci, MD;  Location: [facility];  Service: Orthopedics;  Laterality: Left;   KNEE CLOSED REDUCTION Left 04/15/2017   Procedure: CLOSED MANIPULATION KNEE;  Surgeon: Molli Angelucci, MD;  Location: [facility];  Service: Orthopedics;  Laterality: Left;   LAPAROSCOPIC OVARIAN CYSTECTOMY Right    MENISCECTOMY Right    TOTAL KNEE ARTHROPLASTY Right 07/07/2016   Procedure: TOTAL KNEE ARTHROPLASTY;  Surgeon: Molli Angelucci, MD;  Location: [facility];  Service: Orthopedics;   Laterality: Right;   TOTAL KNEE ARTHROPLASTY Left 03/16/2017   Procedure: TOTAL KNEE ARTHROPLASTY;  Surgeon: Molli Angelucci, MD;  Location: [facility];  Service: Orthopedics;  Laterality: Left;   TUBAL LIGATION      Family Psychiatric History: Please see initial evaluation for full details. I have reviewed the history. No updates at this time.     Family History:  Family History  Problem Relation Age of Onset   Osteoarthritis Mother    Heart failure Father    Diabetes Father    Hypertension Father    Breast cancer Neg Hx    Ovarian cancer Neg Hx    Colon cancer Neg Hx     Social History:  Social History   Socioeconomic History   Marital status: Married    Spouse name: Nicholette Barley   Number of children: Not on file  Years of education: Not on file   Highest education level: 12th grade  Occupational History   Not on file  Tobacco Use   Smoking status: Former    Current packs/day: 0.00    Types: Cigarettes    Quit date: 03/01/2015    Years since quitting: 8.8   Smokeless tobacco: Never  Vaping Use   Vaping status: Never Used  Substance and Sexual Activity   Alcohol use: Not Currently   Drug use: No   Sexual activity: Not Currently    Partners: Male    Birth control/protection: Surgical    Comment: hysterectomy  Other Topics Concern   Not on file  Social History Narrative   Not on file   Social Drivers of Health   Financial Resource Strain: Low Risk  (12/06/2023)   Received from [facility]   Overall Financial Resource Strain (CARDIA)    Difficulty of Paying Living Expenses: Not very hard  Food Insecurity: No Food Insecurity (12/06/2023)   Received from [facility]   Hunger Vital Sign    Worried About Running Out of Food in the Last Year: Never true    Ran Out of Food in the Last Year: Never true  Transportation Needs: No Transportation Needs (12/06/2023)   Received from [facility] - [facility]    In  the past 12 months, has lack of transportation kept you from medical appointments or from getting medications?: No    Lack of Transportation (Non-Medical): No  Physical Activity: Not on file  Stress: Not on file  Social Connections: Not on file    Allergies: No Known Allergies  Metabolic Disorder Labs: No results found for: "HGBA1C", "MPG" No results found for: "PROLACTIN" No results found for: "CHOL", "TRIG", "HDL", "CHOLHDL", "VLDL", "LDLCALC" Lab Results  Component Value Date   TSH 1.612 07/23/2023    Therapeutic Level Labs: No results found for: "LITHIUM" No results found for: "VALPROATE" No results found for: "CBMZ"  Current Medications: Current Outpatient Medications  Medication Sig Dispense Refill   albuterol  (VENTOLIN  HFA) 108 (90 Base) MCG/ACT inhaler Inhale into the lungs every 6 (six) hours as needed for wheezing or shortness of breath.     atorvastatin (LIPITOR) 10 MG tablet Take 10 mg by mouth daily.     bacitracin  ointment Apply to affected area daily 30 g 0   celecoxib (CELEBREX) 200 MG capsule Take 200 mg by mouth 2 (two) times daily.     cyclobenzaprine  (FLEXERIL ) 5 MG tablet Take 1 tablet (5 mg total) by mouth 3 (three) times daily as needed. 12 tablet 0   Dulaglutide (TRULICITY) 1.5 MG/0.5ML SOPN Inject into the skin once a week. Takes every Wednesday     escitalopram  (LEXAPRO ) 10 MG tablet Take 10 mg by mouth daily.     famotidine  (PEPCID ) 20 MG tablet Take 20 mg by mouth 2 (two) times daily.     HYDROcodone -acetaminophen  (NORCO/VICODIN) 5-325 MG tablet Take 1 tablet by mouth every 4 (four) hours as needed. 15 tablet 0   lisinopril -hydrochlorothiazide (ZESTORETIC) 20-25 MG tablet Take 1 tablet by mouth daily.     metFORMIN (GLUCOPHAGE) 1000 MG tablet Take 1,000 mg by mouth daily with breakfast.     metoprolol succinate (TOPROL-XL) 25 MG 24 hr tablet Take 25 mg by mouth daily.     pantoprazole  (PROTONIX ) 40 MG tablet Take 40 mg by mouth 2 (two) times daily as  needed.      Vitamin  D, Ergocalciferol, (DRISDOL) 50000 units CAPS capsule Take 50,000 Units by mouth every 7 (seven) days.     [START ON 01/13/2024] FLUoxetine  (PROZAC ) 40 MG capsule Take 1 capsule (40 mg total) by mouth daily. 30 capsule 1   [START ON 01/30/2024] ramelteon  (ROZEREM ) 8 MG tablet Take 1 tablet (8 mg total) by mouth at bedtime as needed. for sleep 30 tablet 0   No current facility-administered medications for this visit.     Musculoskeletal: Strength & Muscle Tone: within normal limits Gait & Station: normal Patient leans: N/A  Psychiatric Specialty Exam: Review of Systems  Psychiatric/Behavioral:  Positive for decreased concentration, dysphoric mood and sleep disturbance. Negative for agitation, behavioral problems, confusion, hallucinations, self-injury and suicidal ideas. The patient is nervous/anxious. The patient is not hyperactive.   All other systems reviewed and are negative.   Blood pressure (!) 165/106, pulse 67, temperature (!) 97.1 F (36.2 C), temperature source Temporal, height 5\' 4"  (1.626 m), weight 194 lb 12.8 oz (88.4 kg), last menstrual period 01/07/2017.Body mass index is 33.44 kg/m.  General Appearance: Well Groomed  Eye Contact:  Good  Speech:  Clear and Coherent  Volume:  Normal  Mood:  Depressed  Affect:  Appropriate, Congruent, Restricted, and Tearful  Thought Process:  Coherent  Orientation:  Full (Time, Place, and Person)  Thought Content: Logical   Suicidal Thoughts:  No  Homicidal Thoughts:  No  Memory:  Immediate;   Good  Judgement:  Good  Insight:  Good  Psychomotor Activity:  Normal  Concentration:  Concentration: Good and Attention Span: Good  Recall:  Good  Fund of Knowledge: Good  Language: Good  Akathisia:  No  Handed:  Right  AIMS (if indicated): not done  Assets:  Communication Skills Desire for Improvement  ADL's:  Intact  Cognition: WNL  Sleep:  Poor   Screenings: GAD-7    Flowsheet Row Office Visit from  01/06/2024 in [facility] Office Visit from 09/09/2023 in [facility] Office Visit from 07/12/2023 in [facility]  Total GAD-7 Score 3 3 19       PHQ2-9    Flowsheet Row Office Visit from 01/06/2024 in [facility] Office Visit from 09/09/2023 in [facility] Office Visit from 07/12/2023 in [facility]  PHQ-2 Total Score 4 2 5   PHQ-9 Total Score 12 11 16       Flowsheet Row Office Visit from 01/06/2024 in [facility] Admission (Discharged) from 12/27/2023 in [facility] from 10/10/2023 in [facility] Department at [facility]  C-SSRS RISK CATEGORY Error: Question 1 not populated Error: Question 1 not populated No Risk        Assessment and Plan:  Caitlyn [name] is a 58 y.o. year old female with a history of depression, diabetes, hypertension, who is referred for depression.    1. MDD (major depressive disorder), recurrent episode, mild (HCC) 2. GAD (generalized anxiety disorder) 3. Prolonged grief disorder Acute stressors include:  Other stressors include:  loss of her son in his 89's after being confined in [place]   History: depression, anxiety since loss of her son     Although she reports overall improvement in depressive symptoms switching from Lexapro  to fluoxetine , she continues to report significant fatigue, down mood and anxiety since the last visit.  We uptitrate fluoxetine  to optimize treatment  for depression.  Noted that although she will greatly benefit from CBT, she is not interested in this.   4. Insomnia, unspecified type She continues to experience initial and middle insomnia.  Will continue ramelteon  to target insomnia.  She has an upcoming  appointment for sleep evaluation.     Plan Increase fluoxetine  40 mg daily  Continue ramelteon  8 mg at night  Next appointment: 6/26 at 4:30, IP     Past trials of medication- sertraline , lexapro  (headache), trazodone , Ambien , ramelteon    The patient demonstrates the following risk factors for suicide: Chronic risk factors for suicide include: psychiatric disorder of depression, anxiety . Acute risk factors for suicide include: loss (financial, interpersonal, professional). Protective factors for this patient include: positive social support, responsibility to others (children, family), coping skills, and hope for the future. Considering these factors, the overall suicide risk at this point appears to be low. Patient is appropriate for outpatient follow up.    Collaboration of Care: Collaboration of Care: Other reviewed notes in EPic  Patient/Guardian was advised Release of Information must be obtained prior to any record release in order to collaborate their care with an outside provider. Patient/Guardian was advised if they have not already done so to contact the registration department to sign all necessary forms in order for us  to release information regarding their care.   Consent: Patient/Guardian gives verbal consent for treatment and assignment of benefits for services provided during this visit. Patient/Guardian expressed understanding and agreed to proceed.    Todd Fossa, MD 01/06/2024, 5:26 PM

## 2024-01-06 NOTE — Patient Instructions (Signed)
 Increase fluoxetine  40 mg daily  Continue ramelteon  8 mg at night  Next appointment: 6/26 at 4:30

## 2024-01-10 ENCOUNTER — Encounter

## 2024-01-10 DIAGNOSIS — G4733 Obstructive sleep apnea (adult) (pediatric): Secondary | ICD-10-CM

## 2024-01-25 DIAGNOSIS — R0683 Snoring: Secondary | ICD-10-CM | POA: Diagnosis not present

## 2024-01-25 DIAGNOSIS — G4733 Obstructive sleep apnea (adult) (pediatric): Secondary | ICD-10-CM | POA: Diagnosis not present

## 2024-01-27 ENCOUNTER — Other Ambulatory Visit: Payer: Self-pay | Admitting: Psychiatry

## 2024-02-16 ENCOUNTER — Ambulatory Visit (INDEPENDENT_AMBULATORY_CARE_PROVIDER_SITE_OTHER): Admitting: Sleep Medicine

## 2024-02-16 ENCOUNTER — Encounter: Payer: Self-pay | Admitting: Sleep Medicine

## 2024-02-16 VITALS — BP 132/80 | HR 73 | Temp 98.3°F | Ht 64.0 in | Wt 199.4 lb

## 2024-02-16 DIAGNOSIS — Z6833 Body mass index (BMI) 33.0-33.9, adult: Secondary | ICD-10-CM

## 2024-02-16 DIAGNOSIS — F5104 Psychophysiologic insomnia: Secondary | ICD-10-CM

## 2024-02-16 DIAGNOSIS — E66811 Obesity, class 1: Secondary | ICD-10-CM

## 2024-02-16 DIAGNOSIS — G4733 Obstructive sleep apnea (adult) (pediatric): Secondary | ICD-10-CM

## 2024-02-16 DIAGNOSIS — Z87891 Personal history of nicotine dependence: Secondary | ICD-10-CM

## 2024-02-16 DIAGNOSIS — I10 Essential (primary) hypertension: Secondary | ICD-10-CM

## 2024-02-16 MED ORDER — TRAZODONE HCL 50 MG PO TABS: 50.0000 mg | ORAL_TABLET | Freq: Every day | ORAL | 1 refills | Status: DC

## 2024-02-16 NOTE — Progress Notes (Signed)
 Name:Caitlyn [name] MRN: [id number] DOB: 22-Jul-1966   CHIEF COMPLAINT:  HST F/U   HISTORY OF PRESENT ILLNESS:  Caitlyn [name] is a 58 y.o. w/ a h/o OSA, DMII, hyperlipidemia, HTN, obesity and GERD who presents to follow up on HST results. The patient underwent HST which revealed mild OSA (13, O2 nadir 85%).     EPWORTH SLEEP SCORE    11/15/2023    1:00 PM  Results of the Epworth flowsheet  Sitting and reading 1  Watching TV 0  Sitting, inactive in a public place (e.g. a theatre or a meeting) 0  As a passenger in a car for an hour without a break 3  Lying down to rest in the afternoon when circumstances permit 1  Sitting and talking to someone 3  Sitting quietly after a lunch without alcohol 1  In a car, while stopped for a few minutes in traffic 0  Total score 9     PAST MEDICAL HISTORY :   has a past medical history of Anemia, Anxiety, Arthritis, Complication of anesthesia, COVID-19, DDD (degenerative disc disease), cervical, DDD (degenerative disc disease), lumbosacral, Depression, Dyspepsia, GERD (gastroesophageal reflux disease), Heart burn, Heart murmur, Hypertension, Irregular menstrual cycle, Left cervical radiculopathy, Myalgia, Non-rheumatic mitral regurgitation, Non-rheumatic tricuspid valve insufficiency, Nonrheumatic aortic (valve) insufficiency, Stress incontinence, T2DM (type 2 diabetes mellitus) (HCC), Transfusion of blood product refused for religious reason (Jehovah's witness), and Vitamin D deficiency.  has a past surgical history that includes Cholecystectomy, laparoscopic; Laparoscopic ovarian cystectomy (Right); Tubal ligation; Meniscectomy (Right); Total knee arthroplasty (Right, 07/07/2016); Cholecystectomy; Joint replacement; Knee arthroscopy (Left, 12/03/2016); Abdominal hysterectomy (Left, 01/18/2017); Total knee arthroplasty (Left, 03/16/2017); Knee Closed Reduction (Left, 04/15/2017); Knee arthrotomy (Left, 10/21/2017);  Esophagogastroduodenoscopy (N/A, 11/04/2017); Colonoscopy with propofol  (N/A, 11/04/2017); Esophagogastroduodenoscopy; Esophagogastroduodenoscopy (egd) with propofol  (N/A, 05/30/2019); Anterior and posterior repair (N/A, 06/07/2023); Bladder suspension (N/A, 06/07/2023); Incision tendon sheath hand (Left); and Colonoscopy (N/A, 12/27/2023). Prior to Admission medications   Medication Sig Start Date End Date Taking? Authorizing Provider  albuterol  (VENTOLIN  HFA) 108 (90 Base) MCG/ACT inhaler Inhale into the lungs every 6 (six) hours as needed for wheezing or shortness of breath.   Yes [provider]  atorvastatin (LIPITOR) 10 MG tablet Take 10 mg by mouth daily.   Yes [provider]  celecoxib (CELEBREX) 200 MG capsule Take 200 mg by mouth 2 (two) times daily.   Yes [provider]  Dulaglutide (TRULICITY) 1.5 MG/0.5ML SOPN Inject into the skin once a week. Takes every Wednesday   Yes [provider]  famotidine  (PEPCID ) 20 MG tablet Take 20 mg by mouth 2 (two) times daily.   Yes [provider]  FLUoxetine  (PROZAC ) 40 MG capsule Take 1 capsule (40 mg total) by mouth daily. 01/13/24 03/13/24 Yes Hisada, Ivan Marion, MD  lisinopril -hydrochlorothiazide (ZESTORETIC) 20-25 MG tablet Take 1 tablet by mouth daily. 09/28/19  Yes [provider]  metFORMIN (GLUCOPHAGE) 1000 MG tablet Take 1,000 mg by mouth daily with breakfast.   Yes [provider]  metoprolol succinate (TOPROL-XL) 25 MG 24 hr tablet Take 25 mg by mouth daily. 10/09/18  Yes [provider]  pantoprazole  (PROTONIX ) 40 MG tablet Take 40 mg by mouth 2 (two) times daily as needed.    Yes [provider]  ramelteon  (ROZEREM ) 8 MG tablet Take 1 tablet (8 mg total) by mouth at bedtime as needed. for sleep 01/30/24 02/29/24 Yes Todd Fossa, MD  bacitracin  ointment Apply to affected area daily Patient not  taking: Reported on 02/16/2024 09/21/23 09/20/24  Ruth Cove, MD   cyclobenzaprine  (FLEXERIL ) 5 MG tablet Take 1 tablet (5 mg total) by mouth 3 (three) times daily as needed. Patient not taking: Reported on 02/16/2024 10/10/23   Twilla Galea, MD  escitalopram  (LEXAPRO ) 10 MG tablet Take 10 mg by mouth daily. Patient not taking: Reported on 02/16/2024    [provider]  HYDROcodone -acetaminophen  (NORCO/VICODIN) 5-325 MG tablet Take 1 tablet by mouth every 4 (four) hours as needed. Patient not taking: Reported on 02/16/2024 09/21/23   Ruth Cove, MD  Vitamin D, Ergocalciferol, (DRISDOL) 50000 units CAPS capsule Take 50,000 Units by mouth every 7 (seven) days. Patient not taking: Reported on 02/16/2024    [provider]   No Known Allergies  FAMILY HISTORY:  family history includes Diabetes in her father; Heart failure in her father; Hypertension in her father; Osteoarthritis in her mother. SOCIAL HISTORY:  reports that she quit smoking about 8 years ago. Her smoking use included cigarettes. She has never used smokeless tobacco. She reports that she does not currently use alcohol. She reports that she does not use drugs.   Review of Systems:  Gen:  Denies  fever, sweats, chills weight loss  HEENT: Denies blurred vision, double vision, ear pain, eye pain, hearing loss, nose bleeds, sore throat Cardiac:  No dizziness, chest pain or heaviness, chest tightness,edema, No JVD Resp:   No cough, -sputum production, -shortness of breath,-wheezing, -hemoptysis,  Gi: Denies swallowing difficulty, stomach pain, nausea or vomiting, diarrhea, constipation, bowel incontinence Gu:  Denies bladder incontinence, burning urine Ext:   Denies Joint pain, stiffness or swelling Skin: Denies  skin rash, easy bruising or bleeding or hives Endoc:  Denies polyuria, polydipsia , polyphagia or weight change Psych:   Denies depression, insomnia or hallucinations  Other:  All other systems negative  VITAL SIGNS: BP 132/80 (BP Location: Right Arm, Patient  Position: Sitting, Cuff Size: Normal)   Pulse 73   Temp 98.3 F (36.8 C) (Oral)   Ht 5' 4 (1.626 m)   Wt 199 lb 6.4 oz (90.4 kg)   LMP 01/07/2017 (Exact Date) Comment: tah/bso  SpO2 97%   BMI 34.23 kg/m    Physical Examination:   General Appearance: No distress  EYES PERRLA, EOM intact.   NECK Supple, No JVD Pulmonary: normal breath sounds, No wheezing.  CardiovascularNormal S1,S2.  No m/r/g.   Abdomen: Benign, Soft, non-tender. Skin:   warm, no rashes, no ecchymosis  Extremities: normal, no cyanosis, clubbing. Neuro:without focal findings,  speech normal  PSYCHIATRIC: Mood, affect within normal limits.   ASSESSMENT AND PLAN  OSA Reviewed HST results with patient. Starting on APAP therapy at 4-14 cm H2O. Discussed the consequences of untreated sleep apnea. Advised not to drive drowsy for safety of patient and others. Will follow up in 3 months.    HTN Stable, on current management. Following with PCP.   Obesity Counseled patient on diet and lifestyle modification.  Insomnia Counseled patient on stimulus control and improving sleep hygiene practices. Will also try patient on Trazodone  50 mg nightly.    MEDICATION ADJUSTMENTS/LABS AND TESTS ORDERED: Recommend Sleep Study   Patient  satisfied with Plan of action and management. All questions answered  I spent a total of 36 minutes reviewing chart data, face-to-face evaluation with the patient, counseling and coordination of care as detailed above.    Theresea Trautmann, M.D.  Sleep Medicine [facility]

## 2024-02-19 NOTE — Progress Notes (Unsigned)
 [facility] MD/PA/NP OP Progress Note  02/24/2024 5:29 PM Nellie Pester  MRN:  [id number]  Chief Complaint:  Chief Complaint  Patient presents with   Follow-up   HPI:  According to the chart review, the following events have occurred since the last visit: The patient was seen by Dr. Jess. HST- mild OSA, APAP therapy was started.   This is a follow-up appointment for depression, anxiety and insomnia.  The interview was conducted with a Spanish interpreter.  She feels fatigued during the day.  Ramelteon  did not work.  She drank some sweet (without caffeine) to help her fatigue prior to coming to the visit, and she now feels a little more anxious.  She feels higher dose of fluoxetine  has been helping.  Although she may feel down once a week or so, it is feeling better.  However, she has initial and middle insomnia.  She has thoughts at night about everything, which includes about the house, animal, children and grandchildren.  Everything comes to her mind.  On further elaboration, she states that she thinks about her son.  She does not think so often when there is sound during the day.  When it is quiet, she thinks about him.  She cannot listen to music or anything as her husband is asleep.  She has occasional anxiety with palpitation. The patient has mood symptoms as in PHQ-9/GAD-7. She denies SI, HI, hallucinations.  She tearfully describes that she does not think she can do therapy as it can make it worse, thinking more about her son. She denies nightmares.   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  She reports  beautiful childhood.  Her father was absent, and her mother raised her. She moved to [facility] in 2015 as she wanted a change, and has gotten married with her current husband.  She does not think she can live there anymore due to the heat. She enjoys tranquility and peace here. However, she misses fruits and the beach.    Substance use   Tobacco Alcohol Other substances/  Current   A beer at times denies  Past   denies denies  Past Treatment           Visit Diagnosis:    ICD-10-CM   1. MDD (major depressive disorder), recurrent episode, mild (HCC)  F33.0     2. GAD (generalized anxiety disorder)  F41.1     3. 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 Insomnia, unspecified type  G47.00       Past Psychiatric History: Please see initial evaluation for full details. I have reviewed the history. No updates at this time.     Past Medical History:  Past Medical History:  Diagnosis Date   Anemia    Anxiety    Arthritis    Complication of anesthesia    a.) anesthesia awareness - has woken up several times during surgery in the past; could not move but could hear   COVID-19    DDD (degenerative disc disease), cervical    DDD (degenerative disc disease), lumbosacral  Depression    Dyspepsia    GERD (gastroesophageal reflux disease)    Heart burn    Heart murmur    Hypertension    Irregular menstrual cycle    Left cervical radiculopathy    Myalgia    Non-rheumatic mitral regurgitation    Non-rheumatic tricuspid valve insufficiency    Nonrheumatic aortic (valve) insufficiency    Stress incontinence    T2DM (type 2 diabetes mellitus) (HCC)    Transfusion of blood product refused for religious reason (Jehovah's witness)    Vitamin D deficiency     Past Surgical History:  Procedure Laterality Date   ABDOMINAL HYSTERECTOMY Left 01/18/2017   Procedure: HYSTERECTOMY ABDOMINAL WITH LEFT SALPINGO OOPHERECTOMY;  Surgeon: Kathe Gladis LABOR, MD;  Location: [facility];  Service: Gynecology;   Laterality: Left;   ANTERIOR AND POSTERIOR REPAIR N/A 06/07/2023   Procedure: ANTERIOR (CYSTOCELE) AND POSTERIOR REPAIR (RECTOCELE) WITH TOT;  Surgeon: Janit Alm Agent, MD;  Location: [facility];  Service: Gynecology;  Laterality: N/A;   BLADDER SUSPENSION N/A 06/07/2023   Procedure: TOT;  Surgeon: Janit Alm Agent, MD;  Location: [facility];  Service: Gynecology;  Laterality: N/A;   CHOLECYSTECTOMY     CHOLECYSTECTOMY, LAPAROSCOPIC     COLONOSCOPY N/A 12/27/2023   Procedure: COLONOSCOPY;  Surgeon: Onita Elspeth Sharper, DO;  Location: [facility]COPY;  Service: Gastroenterology;  Laterality: N/A;  DM  (Spanish Interpreter needed)   COLONOSCOPY WITH PROPOFOL  N/A 11/04/2017   Procedure: COLONOSCOPY WITH PROPOFOL ;  Surgeon: Gaylyn Gladis PENNER, MD;  Location: [facility] ENDOSCOPY;  Service: Endoscopy;  Laterality: N/A;   ESOPHAGOGASTRODUODENOSCOPY N/A 11/04/2017   Procedure: ESOPHAGOGASTRODUODENOSCOPY (EGD);  Surgeon: Gaylyn Gladis PENNER, MD;  Location: [facility];  Service: Endoscopy;  Laterality: N/A;   ESOPHAGOGASTRODUODENOSCOPY     ESOPHAGOGASTRODUODENOSCOPY (EGD) WITH PROPOFOL  N/A 05/30/2019   Procedure: ESOPHAGOGASTRODUODENOSCOPY (EGD) WITH PROPOFOL ;  Surgeon: Toledo, Ladell POUR, MD;  Location: [facility] ENDOSCOPY;  Service: Gastroenterology;  Laterality: N/A;   INCISION TENDON SHEATH HAND Left    JOINT REPLACEMENT     KNEE ARTHROSCOPY Left 12/03/2016   Procedure: ARTHROSCOPY KNEE, partial medial menisectomy;  Surgeon: Sharper Flake, MD;  Location: [facility];  Service: Orthopedics;  Laterality: Left;   KNEE ARTHROTOMY Left 10/21/2017   Procedure: KNEE ARTHROTOMY LEFT LATERAL RELEASE MEDIAL RETINACULART REPAIR POLY EXCHANGE;  Surgeon: Flake Sharper, MD;  Location: [facility];  Service: Orthopedics;  Laterality: Left;   KNEE CLOSED REDUCTION Left 04/15/2017   Procedure: CLOSED MANIPULATION KNEE;  Surgeon: Flake Sharper, MD;  Location: [facility];  Service: Orthopedics;  Laterality: Left;   LAPAROSCOPIC  OVARIAN CYSTECTOMY Right    MENISCECTOMY Right    TOTAL KNEE ARTHROPLASTY Right 07/07/2016   Procedure: TOTAL KNEE ARTHROPLASTY;  Surgeon: Sharper Flake, MD;  Location: [facility];  Service: Orthopedics;  Laterality: Right;   TOTAL KNEE ARTHROPLASTY Left 03/16/2017   Procedure: TOTAL KNEE ARTHROPLASTY;  Surgeon: Flake Sharper, MD;  Location: [facility];  Service: Orthopedics;  Laterality: Left;   TUBAL LIGATION      Family Psychiatric History: Please see initial evaluation for full details. I have reviewed the history. No updates at this time.     Family History:  Family History  Problem Relation Age of Onset   Osteoarthritis Mother    Heart failure Father    Diabetes Father    Hypertension Father    Breast cancer Neg Hx    Ovarian cancer Neg Hx    Colon cancer Neg Hx     Social History:  Social History  Socioeconomic History   Marital status: Married    Spouse name: Maranda   Number of children: Not on file   Years of education: Not on file   Highest education level: 12th grade  Occupational History   Not on file  Tobacco Use   Smoking status: Former    Current packs/day: 0.00    Types: Cigarettes    Quit date: 03/01/2015    Years since quitting: 8.9   Smokeless tobacco: Never  Vaping Use   Vaping status: Never Used  Substance and Sexual Activity   Alcohol use: Not Currently   Drug use: No   Sexual activity: Not Currently    Partners: Male    Birth control/protection: Surgical    Comment: hysterectomy  Other Topics Concern   Not on file  Social History Narrative   Not on file   Social Drivers of Health   Financial Resource Strain: Low Risk  (01/24/2024)   Received from [facility]   Overall Financial Resource Strain (CARDIA)    Difficulty of Paying Living Expenses: Not hard at all  Food Insecurity: No Food Insecurity (01/24/2024)   Received from [facility]   Hunger Vital Sign    Within the past 12 months, you worried that  your food would run out before you got the money to buy more.: Never true    Within the past 12 months, the food you bought just didn't last and you didn't have money to get more.: Never true  Transportation Needs: No Transportation Needs (01/24/2024)   Received from [facility]    In the past 12 months, has lack of transportation kept you from medical appointments or from getting medications?: No    Lack of Transportation (Non-Medical): No  Physical Activity: Not on file  Stress: Not on file  Social Connections: Not on file    Allergies: No Known Allergies  Metabolic Disorder Labs: No results found for: HGBA1C, MPG No results found for: PROLACTIN No results found for: CHOL, TRIG, HDL, CHOLHDL, VLDL, LDLCALC Lab Results  Component Value Date   TSH 1.612 07/23/2023    Therapeutic Level Labs: No results found for: LITHIUM No results found for: VALPROATE No results found for: CBMZ  Current Medications: Current Outpatient Medications  Medication Sig Dispense Refill   zolpidem  (AMBIEN ) 5 MG tablet Take 1 tablet (5 mg total) by mouth at bedtime as needed for sleep. 30 tablet 0   albuterol  (VENTOLIN  HFA) 108 (90 Base) MCG/ACT inhaler Inhale into the lungs every 6 (six) hours as needed for wheezing or shortness of breath.     atorvastatin (LIPITOR) 10 MG tablet Take 10 mg by mouth daily.     bacitracin  ointment Apply to affected area daily (Patient not taking: Reported on 02/16/2024) 30 g 0   celecoxib (CELEBREX) 200 MG capsule Take 200 mg by mouth 2 (two) times daily.     cyclobenzaprine  (FLEXERIL ) 5 MG tablet Take 1 tablet (5 mg total) by mouth 3 (three) times daily as needed. (Patient not taking: Reported on 02/16/2024) 12 tablet 0   Dulaglutide (TRULICITY) 1.5 MG/0.5ML SOPN Inject into the skin once a week. Takes every Wednesday     famotidine  (PEPCID ) 20 MG tablet Take 20 mg by mouth 2 (two) times daily.     [START  ON 03/13/2024] FLUoxetine  (PROZAC ) 40 MG capsule Take 1 capsule (40 mg total) by mouth daily. 30 capsule 3   HYDROcodone -acetaminophen  (NORCO/VICODIN) 5-325  MG tablet Take 1 tablet by mouth every 4 (four) hours as needed. (Patient not taking: Reported on 02/16/2024) 15 tablet 0   lisinopril -hydrochlorothiazide (ZESTORETIC) 20-25 MG tablet Take 1 tablet by mouth daily.     metFORMIN (GLUCOPHAGE) 1000 MG tablet Take 1,000 mg by mouth daily with breakfast.     metoprolol succinate (TOPROL-XL) 25 MG 24 hr tablet Take 25 mg by mouth daily.     pantoprazole  (PROTONIX ) 40 MG tablet Take 40 mg by mouth 2 (two) times daily as needed.      ramelteon  (ROZEREM ) 8 MG tablet Take 1 tablet (8 mg total) by mouth at bedtime as needed. for sleep 30 tablet 0   traZODone  (DESYREL ) 50 MG tablet Take 1 tablet (50 mg total) by mouth at bedtime. (Patient not taking: Reported on 02/24/2024) 90 tablet 1   Vitamin D, Ergocalciferol, (DRISDOL) 50000 units CAPS capsule Take 50,000 Units by mouth every 7 (seven) days. (Patient not taking: Reported on 02/16/2024)     No current facility-administered medications for this visit.     Musculoskeletal: Strength & Muscle Tone: within normal limits Gait & Station: normal Patient leans: N/A  Psychiatric Specialty Exam: Review of Systems  Psychiatric/Behavioral:  Positive for dysphoric mood and sleep disturbance. Negative for agitation, behavioral problems, confusion, decreased concentration, hallucinations, self-injury and suicidal ideas. The patient is nervous/anxious. The patient is not hyperactive.   All other systems reviewed and are negative.   Blood pressure (!) 143/86, pulse 66, temperature (!) 97 F (36.1 C), temperature source Temporal, height 5' 4 (1.626 m), weight 197 lb (89.4 kg), last menstrual period 01/07/2017.Body mass index is 33.81 kg/m.  General Appearance: Well Groomed  Eye Contact:  Good  Speech:  Clear and Coherent  Volume:  Normal  Mood:  better   Affect:  Appropriate, Congruent, and Full Range  Thought Process:  Coherent  Orientation:  Full (Time, Place, and Person)  Thought Content: Logical   Suicidal Thoughts:  No  Homicidal Thoughts:  No  Memory:  Immediate;   Good  Judgement:  Good  Insight:  Good  Psychomotor Activity:  Normal  Concentration:  Concentration: Good and Attention Span: Good  Recall:  Good  Fund of Knowledge: Good  Language: Good  Akathisia:  No  Handed:  Right  AIMS (if indicated): not done  Assets:  Communication Skills Desire for Improvement  ADL's:  Intact  Cognition: WNL  Sleep:  Poor   Screenings: GAD-7    Loss adjuster, chartered Office Visit from 02/24/2024 in [facility] Office Visit from 01/06/2024 in [facility] Office Visit from 09/09/2023 in [facility] Office Visit from 07/12/2023 in [facility]  Total GAD-7 Score 3 3 3 19    PHQ2-9    Flowsheet Row Office Visit from 02/24/2024 in [facility] Office Visit from 01/06/2024 in [facility] Office Visit from 09/09/2023 in [facility] Office Visit from 07/12/2023 in [facility]  PHQ-2 Total Score 1 4 2 5   PHQ-9 Total Score 8 12 11 16    Flowsheet Row Office Visit from 02/24/2024 in [facility] Office Visit from 01/06/2024 in [facility] Admission (Discharged) from 12/27/2023 in [facility]  C-SSRS RISK CATEGORY Error: Question 1 not populated Error: Question 1 not populated Error: Question 1 not  populated     Assessment and Plan:  Kehaulani Fruin is a 58 y.o. year old female with a history of depression, diabetes, hypertension,  who is referred for depression.    1. MDD (major depressive disorder), recurrent episode, mild (HCC) 2. GAD (generalized anxiety disorder) 3. Prolonged grief disorder Acute stressors include:  Other stressors include:  loss of her son in his 47's after being confined in [place]   History: depression, anxiety since loss of her son    The exam is notable for tearfulness when she speaks about her son, although she reports overall improvement in her mood symptoms since uptitration of fluoxetine .  Although he was strongly recommended to consider CBT/supportive therapy, she is not interested in this.  Will continue current dose of fluoxetine  to target depression and anxiety.   4. Insomnia, unspecified type - mild OSA, APAP was recommended Unstable.  She continues to experience initial and middle insomnia.   She had remitted benefit from ramelteon .  Although CBT-I and CBT were recommended given that her mood appears to be a major contributor to her sleep difficulties, she expressed no interest in pursuing these therapies.  She reports significant benefit from Ambien , and would like to be back on this medication.  She agrees that this medication will be prescribed only for 30 days to avoid long-term side effect.      Plan Continue fluoxetine  40 mg daily  Discontinue ramelteon , trazodone  Start Ambien  5 mg at night as needed for insomnia Next appointment: 7/31 at 3:30, IP   Past trials of medication- sertraline , lexapro  (headache), trazodone , Ambien , ramelteon ,    The patient demonstrates the following risk factors for suicide: Chronic risk factors for suicide include: psychiatric disorder of depression, anxiety . Acute risk factors for suicide include: loss (financial, interpersonal, professional). Protective factors for this patient include: positive social support, responsibility to others (children, family), coping skills, and hope for the future. Considering these factors, the overall suicide  risk at this point appears to b not so medication only for 30 days but I does not use for long-term and associated with e low. Patient is appropriate for outpatient follow up.    Collaboration of Care: Collaboration of Care: Other reviewed notes in Epic  Patient/Guardian was advised Release of Information must be obtained prior to any record release in order to collaborate their care with an outside provider. Patient/Guardian was advised if they have not already done so to contact the registration department to sign all necessary forms in order for us  to release information regarding their care.   Consent: Patient/Guardian gives verbal consent for treatment and assignment of benefits for services provided during this visit. Patient/Guardian expressed understanding and agreed to proceed.    Katheren Sleet, MD 02/24/2024, 5:29 PM

## 2024-02-24 ENCOUNTER — Ambulatory Visit (INDEPENDENT_AMBULATORY_CARE_PROVIDER_SITE_OTHER): Admitting: Psychiatry

## 2024-02-24 ENCOUNTER — Encounter: Payer: Self-pay | Admitting: Psychiatry

## 2024-02-24 ENCOUNTER — Other Ambulatory Visit: Payer: Self-pay

## 2024-02-24 VITALS — BP 143/86 | HR 66 | Temp 97.0°F | Ht 64.0 in | Wt 197.0 lb

## 2024-02-24 DIAGNOSIS — F411 Generalized anxiety disorder: Secondary | ICD-10-CM | POA: Diagnosis not present

## 2024-02-24 DIAGNOSIS — G4700 Insomnia, unspecified: Secondary | ICD-10-CM | POA: Diagnosis not present

## 2024-02-24 DIAGNOSIS — F330 Major depressive disorder, recurrent, mild: Secondary | ICD-10-CM | POA: Diagnosis not present

## 2024-02-24 DIAGNOSIS — F4381 Prolonged grief disorder: Secondary | ICD-10-CM

## 2024-02-24 MED ORDER — ZOLPIDEM TARTRATE 5 MG PO TABS: 5.0000 mg | ORAL_TABLET | Freq: Every evening | ORAL | 0 refills | Status: AC | PRN

## 2024-02-24 MED ORDER — FLUOXETINE HCL 40 MG PO CAPS: 40.0000 mg | ORAL_CAPSULE | Freq: Every day | ORAL | 3 refills | Status: AC

## 2024-02-24 NOTE — Patient Instructions (Signed)
 Continue fluoxetine  40 mg daily  Discontinue ramelteon   Start Ambien  5 mg at night as needed for insomnia Next appointment: 7/31 at 3:30

## 2024-02-28 NOTE — Progress Notes (Signed)
 Established Patient Visit   Chief Complaint: Chief Complaint  Patient presents with  . Palpitations  . Hypertension   Date of Service: 02/28/2024 Date of Birth: [birth year] PCP: Antone Bergeron, Yancy Blunt, MD  History of Present Illness: Ms. Irven Bishop is a 58 y.o.female patient who returns for   1.  Mild aortic stenosis / aortic insufficiency  2.  Essential hypertension  3.  Symptomatic premature atrial contractions   The patient was originally referred by her primary care provider, Dr. Manchero, for evaluation for a known history of valvular insufficiency. The patient has a history of essential hypertension. The patient is a native of [place] She moved to [place], [place]  from there, and moved to [place]  5 years ago.  While in [place], the patient was followed by a cardiologist and obtained routine echocardiograms for evaluation of her valvular insufficiency. The patient denies a known history of rheumatic fever.   2D echocardiogram  02/21/2018 revealed normal left ventricular function, with LVEF greater than 55%, with mild aortic, mitral, and tricuspid regurgitation.  The patient also reported experiencing palpitations frequently, lasting about 1 minute, described as a pounding sensation.  48-hour Holter monitor  02/14/2018 revealed normal sinus rhythm with a mean heart rate of 76 bpm.  There were infrequent premature atrial contractions and one premature ventricular contraction.   The patient returns today for a follow-up and reports doing well.  The patient is Spanish-speaking and a translator assisted during this visit. She denies chest pain. She has chronic exertional shortness of breath, followed by Dr. Parris.  She reports infrequent palpitations.  She denies peripheral edema.  She denies presyncope or syncope.  The patient is active, walks most days.  ECG 07/30/2021 revealed sinus rhythm at 66 bpm.  7-day Holter monitor 01/28/2022 - 02/04/2020 revealed predominant  sinus rhythm with mean heart rate of 77 bpm, sinus heart rate range 52 to 124 bpm, and rare premature atrial contractions.  2D echocardiogram 07/23/2023 revealed LVEF 50%, mild aortic stenosis (1.5 cm, 2.7 m/s, 30 mmHg, 15 mmHg).  2D echocardiogram 06/11/2021 revealed normal left ventricular function, with LVEF greater than 55%, with mild aortic insufficiency and mild aortic stenosis (1.1 cm, 16.5 mmHg, 9.2 mmHg).  The patient has essential hypertension, blood pressure well controlled, on lisinopril /HCTZ and metoprolol succinate, which are well-tolerated without apparent side effects..  Patient follows a low-sodium, no added salt diet.  Past Medical and Surgical History  Past Medical History Past Medical History:  Diagnosis Date  . Anemia   . Arthritis   . Depression   . Hypertension   . Irregular menstrual cycle   . Type 2 diabetes mellitus (CMS/HHS-HCC)     Past Surgical History She has a past surgical history that includes Cholecystectomy; Essure tubal ligation; right ovary removed; Arthroplasty Total Knee (Right, 07/07/2016); Arthroscopy knee, partial medial menisectomy (Left, 12/03/2016); TOTAL KNEE ARTHROPLASTY (Left) (Left, 03/16/2017); knee arthrotomy left lateral release medial retinaculart repair polyexchange  (Left, 10/21/2017); Colonoscopy (11/04/2017); egd (11/04/2017); egd (05/30/2019); Incision Tendon Sheath For Trigger Finger (Left, 06/14/2019); and Colon @ [facility] (12/27/2023).   Medications and Allergies  Current Medications  Current Outpatient Medications  Medication Sig Dispense Refill  . albuterol  MDI, PROVENTIL , VENTOLIN , PROAIR , HFA 90 mcg/actuation inhaler INHALE 2 PUFFS BY MOUTH EVERY 6 HOURS AS NEEDED FOR WHEEZING 18 g 0  . atorvastatin (LIPITOR) 20 MG tablet Take 1 tablet (20 mg total) by mouth at bedtime    . bacitracin  zinc  ointment Apply to affected area daily    .  celecoxib (CELEBREX) 200 MG capsule Take 1 capsule by mouth twice daily 60 capsule 0  .  cyclobenzaprine  (FLEXERIL ) 5 MG tablet Take 5 mg by mouth 3 (three) times daily as needed    . dulaglutide (TRULICITY) 1.5 mg/0.5 mL subcutaneous pen injector INJECT 1.5MG  UNDER THE SKIN ONCE PER WEEK 6 mL 3  . escitalopram  oxalate (LEXAPRO ) 10 MG tablet Take 10 mg by mouth once daily    . famotidine  (PEPCID ) 20 MG tablet Take 10 mg by mouth 2 (two) times daily as needed for Heartburn    . ibuprofen  (MOTRIN ) 100 mg/5 mL suspension Take by mouth every 6 (six) hours as needed for Fever    . ipratropium-albuteroL  (COMBIVENT RESPIMAT) 20-100 mcg/actuation inhaler Inhale 1 Puff into the lungs 4 (four) times daily as needed for Wheezing 4 g 10  . lisinopriL -hydroCHLOROthiazide (ZESTORETIC) 20-25 mg tablet Take 1 tablet by mouth once daily 90 tablet 1  . metFORMIN (GLUCOPHAGE) 1000 MG tablet TAKE 1 TABLET BY MOUTH TWICE DAILY WITH MEALS 180 tablet 1  . pantoprazole  (PROTONIX ) 20 MG DR tablet Take 20 mg by mouth once daily as needed    . peg-electrolyte (NULYTELY ) solution Use as directed. 4000 mL 0  . blood glucose diagnostic test strip 1 each (1 strip total) 3 (three) times daily Use as instructed. Onetouch verio flex E11.9 100 each 12  . FLUoxetine  (PROZAC ) 20 MG capsule Take 1 capsule by mouth once daily    . lancets Use 1 each once daily Use as instructed. (Patient not taking: Reported on 11/25/2023) 100 each 3  . metoprolol succinate (TOPROL-XL) 50 MG XL tablet Take 0.5 tablets (25 mg total) by mouth once daily 90 tablet 1   No current facility-administered medications for this visit.    Allergies: Patient has no known allergies.  Social and Family History  Social History  reports that she quit smoking about 9 years ago. Her smoking use included cigarettes. She started smoking about 42 years ago. She has a 33 pack-year smoking history. She has never used smokeless tobacco. She reports current alcohol use. She reports that she does not use drugs.  Family History Family History  Problem Relation  Name Age of Onset  . Osteoporosis (Thinning of bones) Mother    . Arthritis Mother      Review of Systems   Review of Systems: The patient denies chest pain, reports progressive chronic exertional shortness of breath, without orthopnea, paroxysmal nocturnal dyspnea, with chronic mild ankle edema, with rare palpitations, without prolonged heart racing, without presyncope, syncope, with fatigue, with occasional dizziness, with acute shoulder pain. Review of 10 Systems is negative except as described above.  Physical Examination   Vitals:BP 130/84   Pulse 78   Ht 162.6 cm (5' 4)   Wt 89.4 kg (197 lb)   LMP 05/04/2016 (LMP Unknown)   SpO2 97%   BMI 33.81 kg/m  Ht:162.6 cm (5' 4) Wt:89.4 kg (197 lb) ADJ:Anib surface area is 2.01 meters squared. Body mass index is 33.81 kg/m.  General: Alert and oriented. Well-appearing. No acute distress. HEENT: Pupils equally reactive to light and accomodation    Neck: Supple, no JVD Lungs: Normal effort of breathing; clear to auscultation bilaterally; no wheezes, rales, rhonchi Heart: Regular rate and rhythm. Grade 2/6 systolic murmur Abdomen:  nondistended, with normal bowel sounds Extremities: no cyanosis, clubbing, or edema Peripheral Pulses: 2+ radial  Skin: Warm, dry, no diaphoresis  Assessment   58 y.o. female with  1. Heart palpitations  2. Primary hypertension   3. Non-rheumatic mitral regurgitation   4. Non-rheumatic tricuspid valve insufficiency   5. Nonrheumatic aortic valve insufficiency    58 year old female with a history of essential hypertension, initially referred by her primary care provider for evaluation of a known history of valvular insufficiency, previously followed by a cardiologist in [place], [place] .  2D echocardiogram 07/23/2023 revealed LVEF 50% with mild aortic stenosis (1.5 cm, 2.7 m/s, 30 mmHg, 15 mmHg).  The patient reported a several year history of experiencing frequent palpitations. 48-hr Holter  monitor  02/14/2018 revealed normal sinus rhythm with a mean heart rate of 76 bpm.  There were infrequent premature atrial contractions and one premature ventricular contraction.  The patient was seen 01/28/2022 reporting increased palpitations.  7-day Holter monitor 01/28/2022 - 02/03/2022 revealed predominant sinus rhythm with mean heart rate of 77 bpm, heart rate range 50 to 124 bpm, and rare premature atrial contractions.  Patient has essential hypertension, blood pressure well controlled on current BP medications.  Plan   1.  Continue current medications 2.  Counseled patient about low-sodium diet 3.  DASH diet printed instructions given to patient 4.  2D echocardiogram 6 months 5.  Return to clinic after 2D echocardiogram   Orders Placed This Encounter  Procedures  . Echo complete    Return in about 6 months (around 08/29/2024), or after echo.  MARSA DOOMS, MD PhD [facility]

## 2024-02-29 ENCOUNTER — Other Ambulatory Visit: Payer: Self-pay | Admitting: Psychiatry

## 2024-03-25 NOTE — Progress Notes (Unsigned)
 [facility] MD/PA/NP OP Progress Note  03/30/2024 5:15 PM Caitlyn [name]  MRN:  [id number]  Chief Complaint:  Chief Complaint  Patient presents with   Follow-up   HPI:  This is a follow-up appointment for depression and insomnia.  The interview was conducted with a Spanish interpreter.  She states that she has been doing well.  Her 5 grandchildren are visiting her from [place].  She has been busy.  She enjoys being with them.  She occasionally feels worried about her grandchildren.  She has been sleeping better with Ambien .  She has been taking every other day or so as there are some days she sleeps better when she is active during the day.  When she is asked about any thoughts at night, she tends to have some ruminative thoughts.  She also agrees that she thinks about her son all the time.  While it has been helpful while her grandchildren are with her, she is unsure after they leave as she will be with her husband only .  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  She sees her friends every day. The patient has mood symptoms as in PHQ-9/GAD-7.   She has good energy lately.  She denies SI, HI, hallucinations.  Although she initially reports hesitation about therapy due to her past experience, she is willing to try again after psychoeducation is provided.    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  She reports beautiful childhood.  Her father was absent, and her mother raised her. She moved to [facility] in 2015 as she wanted a change, and has gotten married with her current  husband.  She does not think she can live there anymore due to the heat. She enjoys tranquility and peace here. However, she misses fruits and the beach.    Substance use   Tobacco Alcohol Other substances/  Current   A beer at times denies  Past   denies denies  Past Treatment           Visit Diagnosis:    ICD-10-CM   1. MDD (major depressive disorder), recurrent episode, mild (HCC)  F33.0 Ambulatory referral to Psychology    2. GAD (generalized anxiety disorder)  F41.1 Ambulatory referral to Psychology    3. Prolonged grief disorder  F43.81 Ambulatory referral to Psychology    4. Insomnia, unspecified type  G47.00       Past Psychiatric History: Please see initial evaluation for full details. I have reviewed the history. No updates at this time.     Past Medical History:  Past Medical History:  Diagnosis Date   Anemia    Anxiety    Arthritis    Complication of anesthesia    a.) anesthesia awareness - has woken up several times during surgery in the past; could not move but could hear   COVID-19    DDD (degenerative disc disease), cervical    DDD (degenerative disc disease), lumbosacral    Depression    Dyspepsia    GERD (gastroesophageal reflux disease)    Heart  burn    Heart murmur    Hypertension    Irregular menstrual cycle    Left cervical radiculopathy    Myalgia    Non-rheumatic mitral regurgitation    Non-rheumatic tricuspid valve insufficiency    Nonrheumatic aortic (valve) insufficiency    Stress incontinence    T2DM (type 2 diabetes mellitus) (HCC)    Transfusion of blood product refused for religious reason (Jehovah's witness)    Vitamin D deficiency     Past Surgical History:  Procedure Laterality Date   ABDOMINAL HYSTERECTOMY Left 01/18/2017   Procedure: HYSTERECTOMY ABDOMINAL WITH LEFT SALPINGO OOPHERECTOMY;  Surgeon: Kathe Gladis LABOR, MD;  Location: [facility] ORS;  Service: Gynecology;  Laterality: Left;   ANTERIOR AND POSTERIOR REPAIR N/A  06/07/2023   Procedure: ANTERIOR (CYSTOCELE) AND POSTERIOR REPAIR (RECTOCELE) WITH TOT;  Surgeon: Janit Alm Agent, MD;  Location: [facility];  Service: Gynecology;  Laterality: N/A;   BLADDER SUSPENSION N/A 06/07/2023   Procedure: TOT;  Surgeon: Janit Alm Agent, MD;  Location: [facility];  Service: Gynecology;  Laterality: N/A;   CHOLECYSTECTOMY     CHOLECYSTECTOMY, LAPAROSCOPIC     COLONOSCOPY N/A 12/27/2023   Procedure: COLONOSCOPY;  Surgeon: Onita Elspeth Sharper, DO;  Location: [facility] ENDOSCOPY;  Service: Gastroenterology;  Laterality: N/A;  DM  (Spanish Interpreter needed)   COLONOSCOPY WITH PROPOFOL  N/A 11/04/2017   Procedure: COLONOSCOPY WITH PROPOFOL ;  Surgeon: Gaylyn Gladis PENNER, MD;  Location: [facility] ENDOSCOPY;  Service: Endoscopy;  Laterality: N/A;   ESOPHAGOGASTRODUODENOSCOPY N/A 11/04/2017   Procedure: ESOPHAGOGASTRODUODENOSCOPY (EGD);  Surgeon: Gaylyn Gladis PENNER, MD;  Location: [facility] ENDOSCOPY;  Service: Endoscopy;  Laterality: N/A;   ESOPHAGOGASTRODUODENOSCOPY     ESOPHAGOGASTRODUODENOSCOPY (EGD) WITH PROPOFOL  N/A 05/30/2019   Procedure: ESOPHAGOGASTRODUODENOSCOPY (EGD) WITH PROPOFOL ;  Surgeon: Toledo, Ladell POUR, MD;  Location: [facility] ENDOSCOPY;  Service: Gastroenterology;  Laterality: N/A;   INCISION TENDON SHEATH HAND Left    JOINT REPLACEMENT     KNEE ARTHROSCOPY Left 12/03/2016   Procedure: ARTHROSCOPY KNEE, partial medial menisectomy;  Surgeon: Sharper Flake, MD;  Location: [facility] ORS;  Service: Orthopedics;  Laterality: Left;   KNEE ARTHROTOMY Left 10/21/2017   Procedure: KNEE ARTHROTOMY LEFT LATERAL RELEASE MEDIAL RETINACULART REPAIR POLY EXCHANGE;  Surgeon: Flake Sharper, MD;  Location: [facility];  Service: Orthopedics;  Laterality: Left;   KNEE CLOSED REDUCTION Left 04/15/2017   Procedure: CLOSED MANIPULATION KNEE;  Surgeon: Flake Sharper, MD;  Location: [facility];  Service: Orthopedics;  Laterality: Left;   LAPAROSCOPIC OVARIAN CYSTECTOMY Right    MENISCECTOMY Right    TOTAL  KNEE ARTHROPLASTY Right 07/07/2016   Procedure: TOTAL KNEE ARTHROPLASTY;  Surgeon: Sharper Flake, MD;  Location: [facility];  Service: Orthopedics;  Laterality: Right;   TOTAL KNEE ARTHROPLASTY Left 03/16/2017   Procedure: TOTAL KNEE ARTHROPLASTY;  Surgeon: Flake Sharper, MD;  Location: [facility];  Service: Orthopedics;  Laterality: Left;   TUBAL LIGATION      Family Psychiatric History: Please see initial evaluation for full details. I have reviewed the history. No updates at this time.     Family History:  Family History  Problem Relation Age of Onset   Osteoarthritis Mother    Heart failure Father    Diabetes Father    Hypertension Father    Breast cancer Neg Hx    Ovarian cancer Neg Hx    Colon cancer Neg Hx     Social History:  Social History   Socioeconomic History   Marital status: Married    Spouse name: Maranda  Number of children: Not on file   Years of education: Not on file   Highest education level: 12th grade  Occupational History   Not on file  Tobacco Use   Smoking status: Former    Current packs/day: 0.00    Types: Cigarettes    Quit date: 03/01/2015    Years since quitting: 9.0   Smokeless tobacco: Never  Vaping Use   Vaping status: Never Used  Substance and Sexual Activity   Alcohol use: Not Currently   Drug use: No   Sexual activity: Not Currently    Partners: Male    Birth control/protection: Surgical    Comment: hysterectomy  Other Topics Concern   Not on file  Social History Narrative   Not on file   Social Drivers of Health   Financial Resource Strain: Low Risk  (01/24/2024)   Received from [facility]   Overall Financial Resource Strain (CARDIA)    Difficulty of Paying Living Expenses: Not hard at all  Food Insecurity: No Food Insecurity (01/24/2024)   Received from [facility]   Hunger Vital Sign    Within the past 12 months, you worried that your food would run out before you got the money to buy  more.: Never true    Within the past 12 months, the food you bought just didn't last and you didn't have money to get more.: Never true  Transportation Needs: No Transportation Needs (01/24/2024)   Received from [facility] - Transportation    In the past 12 months, has lack of transportation kept you from medical appointments or from getting medications?: No    Lack of Transportation (Non-Medical): No  Physical Activity: Not on file  Stress: Not on file  Social Connections: Not on file    Allergies: No Known Allergies  Metabolic Disorder Labs: No results found for: HGBA1C, MPG No results found for: PROLACTIN No results found for: CHOL, TRIG, HDL, CHOLHDL, VLDL, LDLCALC Lab Results  Component Value Date   TSH 1.612 07/23/2023    Therapeutic Level Labs: No results found for: LITHIUM No results found for: VALPROATE No results found for: CBMZ  Current Medications: Current Outpatient Medications  Medication Sig Dispense Refill   albuterol  (VENTOLIN  HFA) 108 (90 Base) MCG/ACT inhaler Inhale into the lungs every 6 (six) hours as needed for wheezing or shortness of breath.     atorvastatin (LIPITOR) 10 MG tablet Take 10 mg by mouth daily.     bacitracin  ointment Apply to affected area daily 30 g 0   celecoxib (CELEBREX) 200 MG capsule Take 200 mg by mouth 2 (two) times daily.     cyclobenzaprine  (FLEXERIL ) 5 MG tablet Take 1 tablet (5 mg total) by mouth 3 (three) times daily as needed. 12 tablet 0   Dulaglutide (TRULICITY) 1.5 MG/0.5ML SOPN Inject into the skin once a week. Takes every Wednesday     famotidine  (PEPCID ) 20 MG tablet Take 20 mg by mouth 2 (two) times daily.     FLUoxetine  (PROZAC ) 40 MG capsule Take 1 capsule (40 mg total) by mouth daily. 30 capsule 3   HYDROcodone -acetaminophen  (NORCO/VICODIN) 5-325 MG tablet Take 1 tablet by mouth every 4 (four) hours as needed. 15 tablet 0   Lemborexant  5 MG TABS Take 1 tablet (5 mg  total) by mouth at bedtime as needed (insomnia). 30 tablet 1   lisinopril -hydrochlorothiazide (ZESTORETIC) 20-25 MG tablet Take 1 tablet by mouth daily.  metFORMIN (GLUCOPHAGE) 1000 MG tablet Take 1,000 mg by mouth daily with breakfast.     metoprolol succinate (TOPROL-XL) 25 MG 24 hr tablet Take 25 mg by mouth daily.     pantoprazole  (PROTONIX ) 40 MG tablet Take 40 mg by mouth 2 (two) times daily as needed.      ramelteon  (ROZEREM ) 8 MG tablet Take 1 tablet (8 mg total) by mouth at bedtime as needed. for sleep 30 tablet 0   traZODone  (DESYREL ) 50 MG tablet Take 1 tablet (50 mg total) by mouth at bedtime. 90 tablet 1   Vitamin D, Ergocalciferol, (DRISDOL) 50000 units CAPS capsule Take 50,000 Units by mouth every 7 (seven) days.     zolpidem  (AMBIEN ) 5 MG tablet Take 1 tablet (5 mg total) by mouth at bedtime as needed for sleep. 30 tablet 0   No current facility-administered medications for this visit.     Musculoskeletal: Strength & Muscle Tone: within normal limits Gait & Station: normal Patient leans: N/A  Psychiatric Specialty Exam: Review of Systems  Psychiatric/Behavioral:  Positive for dysphoric mood and sleep disturbance. Negative for agitation, behavioral problems, confusion, decreased concentration, hallucinations, self-injury and suicidal ideas. The patient is nervous/anxious. The patient is not hyperactive.   All other systems reviewed and are negative.   Blood pressure 128/84, pulse 77, temperature 98.4 F (36.9 C), temperature source Temporal, height 5' 4 (1.626 m), weight 196 lb (88.9 kg), last menstrual period 01/07/2017, SpO2 99%.Body mass index is 33.64 kg/m.  General Appearance: Well Groomed  Eye Contact:  Good  Speech:  Clear and Coherent  Volume:  Normal  Mood:  good  Affect:  Appropriate, Congruent, Full Range, and Tearful  Thought Process:  Coherent  Orientation:  Full (Time, Place, and Person)  Thought Content: Logical   Suicidal Thoughts:  No   Homicidal Thoughts:  No  Memory:  Immediate;   Good  Judgement:  Good  Insight:  Good  Psychomotor Activity:  Normal  Concentration:  Concentration: Good and Attention Span: Good  Recall:  Good  Fund of Knowledge: Good  Language: Good  Akathisia:  No  Handed:  Right  AIMS (if indicated): not done  Assets:  Communication Skills Desire for Improvement  ADL's:  Intact  Cognition: WNL  Sleep:  Fair   Screenings: GAD-7    Garment/textile technologist Visit from 03/30/2024 in [facility] Visit from 02/24/2024 in [facility] Visit from 01/06/2024 in [facility] Office Visit from 09/09/2023 in [facility] Office Visit from 07/12/2023 in [facility]  Total GAD-7 Score 10 3 3 3 19    PHQ2-9    Flowsheet Row Office Visit from 03/30/2024 in [facility] Office Visit from 02/24/2024 in [facility] Office Visit from 01/06/2024 in [facility] Office Visit from 09/09/2023 in [facility] Office Visit from 07/12/2023 in [facility]  PHQ-2 Total Score 2 1 4 2 5   PHQ-9 Total Score 7 8 12 11 16    Flowsheet Row Office Visit from 02/24/2024 in [facility] Office Visit from 01/06/2024 in [facility] Admission (Discharged) from 12/27/2023 in [facility]  C-SSRS RISK CATEGORY Error: Question 1 not populated Error: Question 1 not populated Error: Question 1 not populated  Assessment and Plan:  Caitlyn [name] is a 58 y.o. year old female with a history of depression, diabetes, hypertension, who  presents for follow-up appointment for below.   1. MDD (major depressive disorder), recurrent episode, mild (HCC) 2. GAD (generalized anxiety disorder) 3. Prolonged grief disorder Other stressors include:  loss of her son in his 18's after being confined in [place]   History: depression, anxiety since loss of her son    The exam is notable for brighter affect, and she reports overall improvement in her mood symptoms in the context of taking care of her grandchildren, who are visiting from [place].  However, she continues to report ruminative thoughts about her son especially at night.  She is now open to try psychotherapy to address this.  Will make a referral.  Will continue current dose of duloxetine to target depression and anxiety.   4. Insomnia, unspecified type - mild OSA, APAP was recommended, tried ramelteon , trazodone   Overall improvement since taking Ambien .  She expressed understanding that this medication is used only for short-term.  Will start lemborexant  to target insomnia.  Discussed potential risk of drowsiness, dizziness.  The hope is to discontinue this medication as her insomnia and mood gradually improve.    Plan Continue fluoxetine  40 mg daily  Start lemborexant  5 mg at night as needed for insomnia Referral to therapy, [facility], will also place for wait list onsite incase of the place does not work for her Next appointment: 9/18 at 4 pm, IP   Past trials of medication- sertraline , lexapro  (headache), trazodone , Ambien , ramelteon ,    The patient demonstrates the following risk factors for suicide: Chronic risk factors for suicide include: psychiatric disorder of depression, anxiety . Acute risk factors for suicide include: loss (financial, interpersonal, professional). Protective factors for this patient include: positive social support, responsibility to others (children, family), coping skills, and hope for the future. Considering these factors, the overall  suicide risk at this point appears to b not so medication only for 30 days but I does not use for long-term and associated with e low. Patient is appropriate for outpatient follow up.      Collaboration of Care: Collaboration of Care: Other reviewed notes in Epic  Patient/Guardian was advised Release of Information must be obtained prior to any record release in order to collaborate their care with an outside provider. Patient/Guardian was advised if they have not already done so to contact the registration department to sign all necessary forms in order for us  to release information regarding their care.   Consent: Patient/Guardian gives verbal consent for treatment and assignment of benefits for services provided during this visit. Patient/Guardian expressed understanding and agreed to proceed.    Katheren Sleet, MD 03/30/2024, 5:15 PM

## 2024-03-30 ENCOUNTER — Ambulatory Visit (INDEPENDENT_AMBULATORY_CARE_PROVIDER_SITE_OTHER): Admitting: Psychiatry

## 2024-03-30 ENCOUNTER — Encounter: Payer: Self-pay | Admitting: Psychiatry

## 2024-03-30 VITALS — BP 128/84 | HR 77 | Temp 98.4°F | Ht 64.0 in | Wt 196.0 lb

## 2024-03-30 DIAGNOSIS — G4700 Insomnia, unspecified: Secondary | ICD-10-CM

## 2024-03-30 DIAGNOSIS — F330 Major depressive disorder, recurrent, mild: Secondary | ICD-10-CM | POA: Diagnosis not present

## 2024-03-30 DIAGNOSIS — F4381 Prolonged grief disorder: Secondary | ICD-10-CM | POA: Diagnosis not present

## 2024-03-30 DIAGNOSIS — F411 Generalized anxiety disorder: Secondary | ICD-10-CM | POA: Diagnosis not present

## 2024-03-30 MED ORDER — LEMBOREXANT 5 MG PO TABS: 5.0000 mg | ORAL_TABLET | Freq: Every evening | ORAL | 1 refills | Status: AC | PRN

## 2024-03-30 NOTE — Patient Instructions (Signed)
 Continue fluoxetine  40 mg daily  Start lemborexant  5 mg at night as needed for insomnia Referral to therapy, [facility] Next appointment: 9/18 at 4 pm

## 2024-03-31 ENCOUNTER — Telehealth: Payer: Self-pay

## 2024-03-31 NOTE — Telephone Encounter (Signed)
 went online and submitted the prior auth - pending

## 2024-03-31 NOTE — Telephone Encounter (Signed)
 received email that a prior auth was needed for the dayvigo 

## 2024-04-03 NOTE — Telephone Encounter (Signed)
 [email or web address] prior auth for the dayvigo  tab 5mg  was denied. it is not in the pt drug list and pt would have had to try 2 of covered drugs belsomra and quiviviq

## 2024-04-03 NOTE — Telephone Encounter (Signed)
 Could you ask the patient if she would be willing to try Belsomra for sleep?  Also, please notify her that [facility], where I placed a referral for therapy, does not offer interpreter services. She remains on the waiting list to be seen by our therapist. Thanks.

## 2024-04-04 NOTE — Telephone Encounter (Signed)
left message to call office back.  

## 2024-05-14 NOTE — Progress Notes (Deleted)
 BH MD/PA/NP OP Progress Note  05/14/2024 11:15 AM Caitlyn [name]  MRN:  [id number]  Chief Complaint: No chief complaint on file.  HPI: ***   ? 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  She reports beautiful childhood.  Her father was absent, and her mother raised her. She moved to [facility] in 2015 as she wanted a change, and has gotten married with her current husband.  She does not think she can live there anymore due to the heat. She enjoys tranquility and peace here. However, she misses fruits and the beach.    Substance use   Tobacco Alcohol Other substances/  Current   A beer at times denies  Past   denies denies  Past Treatment             Visit Diagnosis: No diagnosis found.  Past Psychiatric History: Please see initial evaluation for full details. I have reviewed the history. No updates at this time.     Past Medical History:  Past Medical History:  Diagnosis Date   Anemia    Anxiety    Arthritis    Complication of anesthesia    a.) anesthesia awareness - has woken up several times during surgery in the past; could not move but could hear   COVID-19    DDD (degenerative disc disease), cervical    DDD (degenerative disc disease), lumbosacral    Depression    Dyspepsia    GERD (gastroesophageal reflux disease)    Heart burn    Heart murmur    Hypertension    Irregular menstrual cycle    Left cervical radiculopathy    Myalgia    Non-rheumatic mitral regurgitation    Non-rheumatic tricuspid valve insufficiency    Nonrheumatic aortic (valve) insufficiency    Stress incontinence    T2DM (type 2 diabetes mellitus) (HCC)    Transfusion of blood product refused for  religious reason (Jehovah's witness)    Vitamin D deficiency     Past Surgical History:  Procedure Laterality Date   ABDOMINAL HYSTERECTOMY Left 01/18/2017   Procedure: HYSTERECTOMY ABDOMINAL WITH LEFT SALPINGO OOPHERECTOMY;  Surgeon: Kathe Gladis LABOR, MD;  Location: [facility] ORS;  Service: Gynecology;  Laterality: Left;   ANTERIOR AND POSTERIOR REPAIR N/A 06/07/2023   Procedure: ANTERIOR (CYSTOCELE) AND POSTERIOR REPAIR (RECTOCELE) WITH TOT;  Surgeon: Janit Alm Agent, MD;  Location: [facility];  Service: Gynecology;  Laterality: N/A;   BLADDER SUSPENSION N/A 06/07/2023   Procedure: TOT;  Surgeon: Janit Alm Agent, MD;  Location: [facility];  Service: Gynecology;  Laterality: N/A;   CHOLECYSTECTOMY     CHOLECYSTECTOMY, LAPAROSCOPIC     COLONOSCOPY N/A 12/27/2023   Procedure: COLONOSCOPY;  Surgeon: Onita Elspeth Sharper, DO;  Location: [facility] END[facility]COPY;  Service: Gastroenterology;  Laterality: N/A;  DM  (Spanish Interpreter needed)   COLONOSCOPY WITH PROPOFOL  N/A 11/04/2017   Procedure: COLONOSCOPY WITH PROPOFOL ;  Surgeon: Gaylyn Gladis PENNER, MD;  Location: [facility] ENDOSCOPY;  Service: Endoscopy;  Laterality: N/A;   ESOPHAGOGASTRODUODENOSCOPY N/A 11/04/2017   Procedure: ESOPHAGOGASTRODUODENOSCOPY (EGD);  Surgeon: Gaylyn Gladis PENNER, MD;  Location: [facility] ENDOSCOPY;  Service: Endoscopy;  Laterality: N/A;   ESOPHAGOGASTRODUODENOSCOPY     ESOPHAGOGASTRODUODENOSCOPY (EGD) WITH PROPOFOL  N/A 05/30/2019  Procedure: ESOPHAGOGASTRODUODENOSCOPY (EGD) WITH PROPOFOL ;  Surgeon: Toledo, Ladell POUR, MD;  Location: [facility] ENDOSCOPY;  Service: Gastroenterology;  Laterality: N/A;   INCISION TENDON SHEATH HAND Left    JOINT REPLACEMENT     KNEE ARTHROSCOPY Left 12/03/2016   Procedure: ARTHROSCOPY KNEE, partial medial menisectomy;  Surgeon: Ozell Flake, MD;  Location: [facility];  Service: Orthopedics;  Laterality: Left;   KNEE ARTHROTOMY Left 10/21/2017   Procedure: KNEE ARTHROTOMY LEFT LATERAL RELEASE MEDIAL  RETINACULART REPAIR POLY EXCHANGE;  Surgeon: Flake Ozell, MD;  Location: [facility];  Service: Orthopedics;  Laterality: Left;   KNEE CLOSED REDUCTION Left 04/15/2017   Procedure: CLOSED MANIPULATION KNEE;  Surgeon: Flake Ozell, MD;  Location: [facility];  Service: Orthopedics;  Laterality: Left;   LAPAROSCOPIC OVARIAN CYSTECTOMY Right    MENISCECTOMY Right    TOTAL KNEE ARTHROPLASTY Right 07/07/2016   Procedure: TOTAL KNEE ARTHROPLASTY;  Surgeon: Ozell Flake, MD;  Location: [facility];  Service: Orthopedics;  Laterality: Right;   TOTAL KNEE ARTHROPLASTY Left 03/16/2017   Procedure: TOTAL KNEE ARTHROPLASTY;  Surgeon: Flake Ozell, MD;  Location: [facility];  Service: Orthopedics;  Laterality: Left;   TUBAL LIGATION      Family Psychiatric History: Please see initial evaluation for full details. I have reviewed the history. No updates at this time.     Family History:  Family History  Problem Relation Age of Onset   Osteoarthritis Mother    Heart failure Father    Diabetes Father    Hypertension Father    Breast cancer Neg Hx    Ovarian cancer Neg Hx    Colon cancer Neg Hx     Social History:  Social History   Socioeconomic History   Marital status: Married    Spouse name: Maranda   Number of children: Not on file   Years of education: Not on file   Highest education level: 12th grade  Occupational History   Not on file  Tobacco Use   Smoking status: Former    Current packs/day: 0.00    Types: Cigarettes    Quit date: 03/01/2015    Years since quitting: 9.2   Smokeless tobacco: Never  Vaping Use   Vaping status: Never Used  Substance and Sexual Activity   Alcohol use: Not Currently   Drug use: No   Sexual activity: Not Currently    Partners: Male    Birth control/protection: Surgical    Comment: hysterectomy  Other Topics Concern   Not on file  Social History Narrative   Not on file   Social Drivers of Health   Financial Resource Strain: Low Risk  (01/24/2024)    Received from [facility]   Overall Financial Resource Strain (CARDIA)    Difficulty of Paying Living Expenses: Not hard at all  Food Insecurity: No Food Insecurity (01/24/2024)   Received from [facility]   Hunger Vital Sign    Within the past 12 months, you worried that your food would run out before you got the money to buy more.: Never true    Within the past 12 months, the food you bought just didn't last and you didn't have money to get more.: Never true  Transportation Needs: No Transportation Needs (01/24/2024)   Received from [facility] Transportation    In the past 12 months, has lack of transportation kept you from medical appointments or from getting medications?: No    Lack of Transportation (Non-Medical):  No  Physical Activity: Not on file  Stress: Not on file  Social Connections: Not on file    Allergies: No Known Allergies  Metabolic Disorder Labs: No results found for: HGBA1C, MPG No results found for: PROLACTIN No results found for: CHOL, TRIG, HDL, CHOLHDL, VLDL, LDLCALC Lab Results  Component Value Date   TSH 1.612 07/23/2023    Therapeutic Level Labs: No results found for: LITHIUM No results found for: VALPROATE No results found for: CBMZ  Current Medications: Current Outpatient Medications  Medication Sig Dispense Refill   albuterol  (VENTOLIN  HFA) 108 (90 Base) MCG/ACT inhaler Inhale into the lungs every 6 (six) hours as needed for wheezing or shortness of breath.     atorvastatin (LIPITOR) 10 MG tablet Take 10 mg by mouth daily.     bacitracin  ointment Apply to affected area daily 30 g 0   celecoxib (CELEBREX) 200 MG capsule Take 200 mg by mouth 2 (two) times daily.     cyclobenzaprine  (FLEXERIL ) 5 MG tablet Take 1 tablet (5 mg total) by mouth 3 (three) times daily as needed. 12 tablet 0   Dulaglutide (TRULICITY) 1.5 MG/0.5ML SOPN Inject into the skin once a week.  Takes every Wednesday     famotidine  (PEPCID ) 20 MG tablet Take 20 mg by mouth 2 (two) times daily.     FLUoxetine  (PROZAC ) 40 MG capsule Take 1 capsule (40 mg total) by mouth daily. 30 capsule 3   HYDROcodone -acetaminophen  (NORCO/VICODIN) 5-325 MG tablet Take 1 tablet by mouth every 4 (four) hours as needed. 15 tablet 0   Lemborexant  5 MG TABS Take 1 tablet (5 mg total) by mouth at bedtime as needed (insomnia). 30 tablet 1   lisinopril -hydrochlorothiazide (ZESTORETIC) 20-25 MG tablet Take 1 tablet by mouth daily.     metFORMIN (GLUCOPHAGE) 1000 MG tablet Take 1,000 mg by mouth daily with breakfast.     metoprolol succinate (TOPROL-XL) 25 MG 24 hr tablet Take 25 mg by mouth daily.     pantoprazole  (PROTONIX ) 40 MG tablet Take 40 mg by mouth 2 (two) times daily as needed.      ramelteon  (ROZEREM ) 8 MG tablet Take 1 tablet (8 mg total) by mouth at bedtime as needed. for sleep 30 tablet 0   traZODone  (DESYREL ) 50 MG tablet Take 1 tablet (50 mg total) by mouth at bedtime. 90 tablet 1   Vitamin D, Ergocalciferol, (DRISDOL) 50000 units CAPS capsule Take 50,000 Units by mouth every 7 (seven) days.     zolpidem  (AMBIEN ) 5 MG tablet Take 1 tablet (5 mg total) by mouth at bedtime as needed for sleep. 30 tablet 0   No current facility-administered medications for this visit.     Musculoskeletal: Strength & Muscle Tone: within normal limits Gait & Station: normal Patient leans: N/A  Psychiatric Specialty Exam: Review of Systems  Last menstrual period 01/07/2017.There is no height or weight on file to calculate BMI.  General Appearance: {Appearance:22683}  Eye Contact:  {B[facility] EYE CONTACT:22684}  Speech:  Clear and Coherent  Volume:  Normal  Mood:  {BHH MOOD:22306}  Affect:  {Affect (PAA):22687}  Thought Process:  Coherent  Orientation:  Full (Time, Place, and Person)  Thought Content: Logical   Suicidal Thoughts:  {ST/HT (PAA):22692}  Homicidal Thoughts:  {ST/HT (PAA):22692}  Memory:   Immediate;   Good  Judgement:  {Judgement (PAA):22694}  Insight:  {Insight (PAA):22695}  Psychomotor Activity:  Normal  Concentration:  Concentration: Good and Attention Span: Good  Recall:  Good  Fund of  Knowledge: Good  Language: Good  Akathisia:  No  Handed:  Right  AIMS (if indicated): not done  Assets:  Communication Skills Desire for Improvement  ADL's:  Intact  Cognition: WNL  Sleep:  {BHH GOOD/FAIR/POOR:22877}   Screenings: GAD-7    Garment/textile technologist Visit from 03/30/2024 in [facility] Office Visit from 02/24/2024 in [facility] Office Visit from 01/06/2024 in [facility] Office Visit from 09/09/2023 in [facility] Office Visit from 07/12/2023 in [facility]  Total GAD-7 Score 10 3 3 3 19    PHQ2-9    Flowsheet Row Office Visit from 03/30/2024 in [facility] Office Visit from 02/24/2024 in [facility] Office Visit from 01/06/2024 in [facility] Office Visit from 09/09/2023 in [facility] Office Visit from 07/12/2023 in [facility]  PHQ-2 Total Score 2 1 4 2 5   PHQ-9 Total Score 7 8 12 11 16    Flowsheet Row Office Visit from 02/24/2024 in [facility] Office Visit from 01/06/2024 in [facility] Admission (Discharged) from 12/27/2023 in [facility]  C-SSRS RISK CATEGORY Error: Question 1 not populated Error: Question 1 not populated Error: Question 1 not populated     Assessment and Plan:  Caitlyn [name] is a 58 y.o. year old female with a history of depression,  diabetes, hypertension, who presents for follow-up appointment for below.   1. MDD (major depressive disorder), recurrent episode, mild (HCC) 2. GAD (generalized anxiety disorder) 3. Prolonged grief disorder Other stressors include:  loss of her son in his 73's after being confined in [place]   History: depression, anxiety since loss of her son    The exam is notable for brighter affect, and she reports overall improvement in her mood symptoms in the context of taking care of her grandchildren, who are visiting from [place].  However, she continues to report ruminative thoughts about her son especially at night.  She is now open to try psychotherapy to address this.  Will make a referral.  Will continue current dose of duloxetine to target depression and anxiety.    4. Insomnia, unspecified type - mild OSA, APAP was recommended, tried ramelteon , trazodone   Overall improvement since taking Ambien .  She expressed understanding that this medication is used only for short-term.  Will start lemborexant  to target insomnia.  Discussed potential risk of drowsiness, dizziness.  The hope is to discontinue this medication as her insomnia and mood gradually improve.    Plan Continue fluoxetine  40 mg daily  Start lemborexant  5 mg at night as needed for insomnia Referral to therapy, [facility], will also place for wait list onsite incase of the place does not work for her Next appointment: 9/18 at 4 pm, IP   Past trials of medication- sertraline , lexapro  (headache), trazodone , Ambien , ramelteon ,    The patient demonstrates the following risk factors for suicide: Chronic risk factors for suicide include: psychiatric disorder of depression, anxiety . Acute risk factors for suicide include: loss (financial, interpersonal, professional). Protective factors for this patient include: positive social support, responsibility to others (children, family), coping skills, and hope for the future.  Considering these factors, the overall suicide risk at this point appears to b not so medication only for  30 days but I does not use for long-term and associated with e low. Patient is appropriate for outpatient follow up.    Collaboration of Care: Collaboration of Care: {BH OP Collaboration of Care:21014065}  Patient/Guardian was advised Release of Information must be obtained prior to any record release in order to collaborate their care with an outside provider. Patient/Guardian was advised if they have not already done so to contact the registration department to sign all necessary forms in order for us  to release information regarding their care.   Consent: Patient/Guardian gives verbal consent for treatment and assignment of benefits for services provided during this visit. Patient/Guardian expressed understanding and agreed to proceed.    Katheren Sleet, MD 05/14/2024, 11:15 AM

## 2024-05-18 ENCOUNTER — Ambulatory Visit: Admitting: Psychiatry

## 2024-05-18 ENCOUNTER — Ambulatory Visit: Admitting: Sleep Medicine

## 2024-07-20 ENCOUNTER — Other Ambulatory Visit: Payer: Self-pay | Admitting: Orthopedic Surgery

## 2024-07-20 DIAGNOSIS — S46012A Strain of muscle(s) and tendon(s) of the rotator cuff of left shoulder, initial encounter: Secondary | ICD-10-CM

## 2024-07-24 ENCOUNTER — Ambulatory Visit
Admission: RE | Admit: 2024-07-24 | Discharge: 2024-07-24 | Disposition: A | Discharge status: Home / Self Care | Source: Ambulatory Visit | Attending: Orthopedic Surgery | Admitting: Orthopedic Surgery

## 2024-07-24 DIAGNOSIS — S46012A Strain of muscle(s) and tendon(s) of the rotator cuff of left shoulder, initial encounter: Secondary | ICD-10-CM | POA: Insufficient documentation

## 2024-08-09 ENCOUNTER — Other Ambulatory Visit: Payer: Self-pay | Admitting: Sleep Medicine

## 2024-08-09 DIAGNOSIS — F5104 Psychophysiologic insomnia: Secondary | ICD-10-CM

## 2024-08-17 ENCOUNTER — Ambulatory Visit

## 2024-08-17 DIAGNOSIS — M25512 Pain in left shoulder: Secondary | ICD-10-CM | POA: Insufficient documentation

## 2024-08-17 DIAGNOSIS — M6281 Muscle weakness (generalized): Secondary | ICD-10-CM | POA: Insufficient documentation

## 2024-08-17 DIAGNOSIS — M25612 Stiffness of left shoulder, not elsewhere classified: Secondary | ICD-10-CM | POA: Insufficient documentation

## 2024-08-17 NOTE — Therapy (Unsigned)
 OUTPATIENT PHYSICAL THERAPY SHOULDER EVALUATION   Patient Name: Caitlyn [name] MRN: [id number] DOB:03/09/1966, 58 y.o., female Today's Date: 08/17/2024  END OF SESSION:   Past Medical History:  Diagnosis Date   Anemia    Anxiety    Arthritis    Complication of anesthesia    a.) anesthesia awareness - has woken up several times during surgery in the past; could not move but could hear   COVID-19    DDD (degenerative disc disease), cervical    DDD (degenerative disc disease), lumbosacral    Depression    Dyspepsia    GERD (gastroesophageal reflux disease)    Heart burn    Heart murmur    Hypertension    Irregular menstrual cycle    Left cervical radiculopathy    Myalgia    Non-rheumatic mitral regurgitation    Non-rheumatic tricuspid valve insufficiency    Nonrheumatic aortic (valve) insufficiency    Stress incontinence    T2DM (type 2 diabetes mellitus) (HCC)    Transfusion of blood product refused for religious reason (Jehovah's witness)    Vitamin D deficiency    Past Surgical History:  Procedure Laterality Date   ABDOMINAL HYSTERECTOMY Left 01/18/2017   Procedure: HYSTERECTOMY ABDOMINAL WITH LEFT SALPINGO OOPHERECTOMY;  Surgeon: Kathe Gladis LABOR, MD;  Location: [facility];  Service: Gynecology;  Laterality: Left;   ANTERIOR AND POSTERIOR REPAIR N/A 06/07/2023   Procedure: ANTERIOR (CYSTOCELE) AND POSTERIOR REPAIR (RECTOCELE) WITH TOT;  Surgeon: Janit Alm Agent, MD;  Location: [facility];  Service: Gynecology;  Laterality: N/A;   BLADDER SUSPENSION N/A 06/07/2023   Procedure: TOT;  Surgeon: Janit Alm Agent, MD;  Location: [facility];  Service: Gynecology;  Laterality: N/A;   CHOLECYSTECTOMY     CHOLECYSTECTOMY, LAPAROSCOPIC     COLONOSCOPY N/A 12/27/2023   Procedure: COLONOSCOPY;  Surgeon: Onita Elspeth Sharper, DO;  Location: [facility] ENDOSCOPY;  Service: Gastroenterology;  Laterality: N/A;  DM  (Spanish Interpreter needed)   COLONOSCOPY WITH PROPOFOL  N/A  11/04/2017   Procedure: COLONOSCOPY WITH PROPOFOL ;  Surgeon: Gaylyn Gladis PENNER, MD;  Location: [facility] ENDOSCOPY;  Service: Endoscopy;  Laterality: N/A;   ESOPHAGOGASTRODUODENOSCOPY N/A 11/04/2017   Procedure: ESOPHAGOGASTRODUODENOSCOPY (EGD);  Surgeon: Gaylyn Gladis PENNER, MD;  Location: [facility] ENDOSCOPY;  Service: Endoscopy;  Laterality: N/A;   ESOPHAGOGASTRODUODENOSCOPY     ESOPHAGOGASTRODUODENOSCOPY (EGD) WITH PROPOFOL  N/A 05/30/2019   Procedure: ESOPHAGOGASTRODUODENOSCOPY (EGD) WITH PROPOFOL ;  Surgeon: Toledo, Ladell POUR, MD;  Location: [facility] ENDOSCOPY;  Service: Gastroenterology;  Laterality: N/A;   INCISION TENDON SHEATH HAND Left    JOINT REPLACEMENT     KNEE ARTHROSCOPY Left 12/03/2016   Procedure: ARTHROSCOPY KNEE, partial medial menisectomy;  Surgeon: Sharper Flake, MD;  Location: [facility];  Service: Orthopedics;  Laterality: Left;   KNEE ARTHROTOMY Left 10/21/2017   Procedure: KNEE ARTHROTOMY LEFT LATERAL RELEASE MEDIAL RETINACULART REPAIR POLY EXCHANGE;  Surgeon: Flake Sharper, MD;  Location: [facility];  Service: Orthopedics;  Laterality: Left;   KNEE CLOSED REDUCTION Left 04/15/2017   Procedure: CLOSED MANIPULATION KNEE;  Surgeon: Flake Sharper, MD;  Location: [facility];  Service: Orthopedics;  Laterality: Left;   LAPAROSCOPIC OVARIAN CYSTECTOMY Right    MENISCECTOMY Right    TOTAL KNEE ARTHROPLASTY Right 07/07/2016   Procedure: TOTAL KNEE ARTHROPLASTY;  Surgeon: Sharper Flake, MD;  Location: [facility];  Service: Orthopedics;  Laterality: Right;   TOTAL KNEE ARTHROPLASTY Left 03/16/2017   Procedure: TOTAL KNEE ARTHROPLASTY;  Surgeon: Flake Sharper, MD;  Location: [facility];  Service: Orthopedics;  Laterality: Left;  TUBAL LIGATION     Patient Active Problem List   Diagnosis Date Noted   Chronic pain syndrome 10/16/2019   Cervicalgia 10/16/2019   Chronic neck pain (1ry area of Pain) (Bilateral) (L>R) 10/16/2019   Chronic shoulder pain (Left) 10/16/2019   Chronic upper extremity pain  (Bilateral) 10/16/2019   Chronic low back pain (Bilateral) w/o sciatica 10/16/2019   Chronic lower extremity pain (Bilateral) (R>L) 10/16/2019   Chronic knee pain after total replacement (Bilateral) (L>R) 10/16/2019   Pharmacologic therapy 10/16/2019   Disorder of skeletal system 10/16/2019   Problems influencing health status 10/16/2019   DDD (degenerative disc disease), cervical 10/16/2019   DDD (degenerative disc disease), lumbosacral 10/16/2019   Abnormal MRI, cervical spine (09/29/2019) 10/16/2019   Bursitis of left shoulder 08/10/2019   Left cervical radiculopathy 08/10/2019   Bilateral arm pain 01/11/2019   Vitamin D deficiency 05/03/2018   Nonrheumatic aortic valve insufficiency 02/28/2018   Non-rheumatic mitral regurgitation 02/28/2018   Non-rheumatic tricuspid valve insufficiency 02/28/2018   Heart palpitations 02/07/2018   Hypertension 02/07/2018   Chronic pain of wrist (Left) 10/27/2017   Myofascial muscle pain 10/27/2017   Synovitis of left knee 10/21/2017   Chest pain on breathing 05/27/2017   Dizziness 05/27/2017   Episodic lightheadedness 05/27/2017   Myalgia 05/12/2017   Primary localized osteoarthritis of left knee 03/16/2017   Surgical menopause, symptomatic 01/26/2017   Status post abdominal hysterectomy and left salpingo-oophorectomy 01/26/2017   S/P TAH (total abdominal hysterectomy) 01/20/2017   Leiomyoma uteri 01/18/2017   Primary localized osteoarthritis of right knee 07/07/2016   Abnormal hepatitis serology 04/10/2016   Polyarthralgia 04/03/2016   Bilateral anterior knee pain 04/03/2016   Anemia 10/23/2015    PCP: Dr. Yancy Roof  REFERRING PROVIDER: Dr. Ozell Flake  REFERRING DIAG: ***  THERAPY DIAG:  No diagnosis found.  Rationale for Evaluation and Treatment: Rehabilitation  ONSET DATE: June-July 2025  SUBJECTIVE:                                                                                                                                                                                       SUBJECTIVE STATEMENT: Lots of pain in shoulder with difficulty lifting Began back in June/July- doing some rearranging in the home and was trying to move a mattress and felt a pop in her shoulder.  Hand dominance: {MISC; OT HAND DOMINANCE:[phone]}  PERTINENT HISTORY: Patient had injection left shoulder last  with much worsening of pain.   PAIN:  Are you having pain? Yes: NPRS scale: *** Pain location: Left shoulder Pain description: sharp and radiating  Aggravating factors: *** Relieving factors: ***  PRECAUTIONS: {Therapy precautions:24002}  RED FLAGS: {  PT Red Flags:29287}   WEIGHT BEARING RESTRICTIONS: {Yes ***/No:24003}  FALLS:  Has patient fallen in last 6 months? 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 Enjoys  PLOF: Independent  PATIENT GOALS: Move my arm without pain  NEXT MD VISIT:   OBJECTIVE:  Note: Objective measures were completed at Evaluation unless otherwise noted.  DIAGNOSTIC FINDINGS:  CLINICAL DATA:  Left shoulder pain   EXAM: MRI OF THE LEFT SHOULDER WITHOUT CONTRAST   TECHNIQUE: Multiplanar, multisequence MR imaging of the shoulder was performed. No intravenous contrast was administered.   COMPARISON:  None Available.   FINDINGS: Rotator cuff: Mild supraspinatus tendinosis with a tiny insertional interstitial tear. Moderate infraspinatus tendinosis. Teres minor tendon is intact. Subscapularis tendon is intact.   Muscles: No muscle atrophy or edema. No intramuscular fluid collection or hematoma.   Biceps Long Head: Intraarticular and extraarticular portions of the biceps tendon are intact.   Acromioclavicular Joint: Mild arthropathy of the  acromioclavicular joint. No subacromial/subdeltoid bursal fluid.   Glenohumeral Joint: No joint effusion. No chondral defect.   Labrum: Grossly intact, but evaluation is limited by lack of intraarticular fluid/contrast.   Bones: No fracture or dislocation. No aggressive osseous lesion.   Other: No fluid collection or hematoma.   IMPRESSION: 1. Mild supraspinatus tendinosis with a tiny insertional interstitial tear. 2. Moderate infraspinatus tendinosis.     Electronically Signed   By: Julaine Blanch M.D.   On: 08/01/2024 15:30  PATIENT SURVEYS:  QUICK DASH      Please rate your ability do the following activities in the last week by selecting the number below the appropriate response.     Activities Rating  Open a tight or new jar.  5  Do heavy household chores (e.g., wash walls, floors). 2  Carry a shopping bag or briefcase 5  Wash your back. 4  Use a knife to cut food. 2  Recreational activities in which you take some force or impact through your arm, shoulder or hand (e.g., golf, hammering, tennis, etc.). 3  During the past week, to what extent has your arm, shoulder or hand problem interfered with your normal social activities with family, friends, neighbors or groups?  3  During the past week, were you limited in your work or other regular daily activities as a result of your arm, shoulder or hand problem? 4  Rate the severity of the following symptoms in the last week: Arm, Shoulder, or hand pain. 4  Rate the severity of the following symptoms in the last week: Tingling (pins and needles) in your arm, shoulder or hand. 5  During the past week, how much difficulty have you had sleeping because of the pain in your arm, shoulder or hand?  4   41  (A QuickDASH score may not be calculated if there is greater than 1 missing item.) [phone]   [phone]  Quick Dash Disability/Symptom Score: [(sum of  (n) responses/ (n)] x 25 =  [phone]     Minimally Clinically Important  Difference (MCID): 15-20 points    COGNITION: Overall cognitive status: {cognition:24006}     SENSATION: {sensation:27233}  POSTURE: ***  UPPER EXTREMITY ROM:   Active ROM Right eval Left eval  Shoulder flexion    Shoulder extension    Shoulder abduction    Shoulder adduction    Shoulder internal rotation  Shoulder external rotation    Elbow flexion    Elbow extension    Wrist flexion    Wrist extension    Wrist ulnar deviation    Wrist radial deviation    Wrist pronation    Wrist supination    (Blank rows = not tested)  UPPER EXTREMITY MMT:  MMT Right eval Left eval  Shoulder flexion    Shoulder extension    Shoulder abduction    Shoulder adduction    Shoulder internal rotation    Shoulder external rotation    Middle trapezius    Lower trapezius    Elbow flexion    Elbow extension    Wrist flexion    Wrist extension    Wrist ulnar deviation    Wrist radial deviation    Wrist pronation    Wrist supination    Grip strength (lbs)    (Blank rows = not tested)  SHOULDER SPECIAL TESTS: Impingement tests: Neer impingement test: positive  and Hawkins/Kennedy impingement test: positive  Instability tests: Load and shift test: negative and Sulcus sign: negative Rotator cuff assessment: {rotator cuff assessment:25234} Biceps assessment: {biceps assessment:25235}  JOINT MOBILITY TESTING:  ***  PALPATION:  ***                                                                                                                             TREATMENT DATE: ***   PATIENT EDUCATION: Education details: *** Person educated: {Person educated:25204} Education method: {Education Method:25205} Education comprehension: {Education Comprehension:25206}  HOME EXERCISE PROGRAM: ***  ASSESSMENT:  CLINICAL IMPRESSION: Patient is a *** y.o. *** who was seen today for physical therapy evaluation and treatment for ***.   OBJECTIVE IMPAIRMENTS: {opptimpairments:25111}.    ACTIVITY LIMITATIONS: {activitylimitations:27494}  PARTICIPATION LIMITATIONS: {participationrestrictions:25113}  PERSONAL FACTORS: {Personal factors:25162} are also affecting patient's functional outcome.   REHAB POTENTIAL: {rehabpotential:25112}  CLINICAL DECISION MAKING: {clinical decision making:25114}  EVALUATION COMPLEXITY: {Evaluation complexity:25115}   GOALS: Goals reviewed with patient? {yes/no:20286}  SHORT TERM GOALS: Target date: ***  *** Baseline: Goal status: INITIAL  2.  *** Baseline:  Goal status: INITIAL  3.  *** Baseline:  Goal status: INITIAL  4.  *** Baseline:  Goal status: INITIAL  5.  *** Baseline:  Goal status: INITIAL  6.  *** Baseline:  Goal status: INITIAL  LONG TERM GOALS: Target date: ***  *** Baseline:  Goal status: INITIAL  2.  *** Baseline:  Goal status: INITIAL  3.  *** Baseline:  Goal status: INITIAL  4.  *** Baseline:  Goal status: INITIAL  5.  *** Baseline:  Goal status: INITIAL  6.  *** Baseline:  Goal status: INITIAL  PLAN:  PT FREQUENCY: {rehab frequency:25116}  PT DURATION: {rehab duration:25117}  PLANNED INTERVENTIONS: {rehab planned interventions:25118::97110-Therapeutic exercises,97530- Therapeutic [phone]- Neuromuscular re-education,97535- Self Rjmz,02859- Manual therapy,Patient/Family education}  PLAN FOR NEXT SESSION: ***   Reyes LOISE London, PT 08/17/2024, 1:13 PM

## 2024-08-21 ENCOUNTER — Ambulatory Visit

## 2024-08-23 ENCOUNTER — Ambulatory Visit

## 2024-08-23 DIAGNOSIS — M25512 Pain in left shoulder: Secondary | ICD-10-CM

## 2024-08-23 DIAGNOSIS — M25612 Stiffness of left shoulder, not elsewhere classified: Secondary | ICD-10-CM

## 2024-08-23 DIAGNOSIS — M6281 Muscle weakness (generalized): Secondary | ICD-10-CM

## 2024-08-23 NOTE — Therapy (Signed)
 " OUTPATIENT PHYSICAL THERAPY SHOULDER TREATMENT   Patient Name: Caitlyn [name] MRN: [id number] DOB:December 01, 1965, 58 y.o., female Today's Date: 08/23/2024  END OF SESSION:  PT End of Session - 08/23/24 1100     Visit Number 2    Number of Visits 24    Date for Recertification  11/09/24    PT Start Time 1100    PT Stop Time 1139    PT Time Calculation (min) 39 min    Activity Tolerance Patient tolerated treatment well;Patient limited by pain    Behavior During Therapy WFL for tasks assessed/performed          Past Medical History:  Diagnosis Date   Anemia    Anxiety    Arthritis    Complication of anesthesia    a.) anesthesia awareness - has woken up several times during surgery in the past; could not move but could hear   COVID-19    DDD (degenerative disc disease), cervical    DDD (degenerative disc disease), lumbosacral    Depression    Dyspepsia    GERD (gastroesophageal reflux disease)    Heart burn    Heart murmur    Hypertension    Irregular menstrual cycle    Left cervical radiculopathy    Myalgia    Non-rheumatic mitral regurgitation    Non-rheumatic tricuspid valve insufficiency    Nonrheumatic aortic (valve) insufficiency    Stress incontinence    T2DM (type 2 diabetes mellitus) (HCC)    Transfusion of blood product refused for religious reason (Jehovah's witness)    Vitamin D deficiency    Past Surgical History:  Procedure Laterality Date   ABDOMINAL HYSTERECTOMY Left 01/18/2017   Procedure: HYSTERECTOMY ABDOMINAL WITH LEFT SALPINGO OOPHERECTOMY;  Surgeon: Kathe Gladis LABOR, MD;  Location: [facility];  Service: Gynecology;  Laterality: Left;   ANTERIOR AND POSTERIOR REPAIR N/A 06/07/2023   Procedure: ANTERIOR (CYSTOCELE) AND POSTERIOR REPAIR (RECTOCELE) WITH TOT;  Surgeon: Janit Alm Agent, MD;  Location: [facility];  Service: Gynecology;  Laterality: N/A;   BLADDER SUSPENSION N/A 06/07/2023   Procedure: TOT;  Surgeon: Janit Alm Agent, MD;   Location: [facility];  Service: Gynecology;  Laterality: N/A;   CHOLECYSTECTOMY     CHOLECYSTECTOMY, LAPAROSCOPIC     COLONOSCOPY N/A 12/27/2023   Procedure: COLONOSCOPY;  Surgeon: Onita Elspeth Sharper, DO;  Location: [facility];  Service: Gastroenterology;  Laterality: N/A;  DM  (Spanish Interpreter needed)   COLONOSCOPY WITH PROPOFOL  N/A 11/04/2017   Procedure: COLONOSCOPY WITH PROPOFOL ;  Surgeon: Gaylyn Gladis PENNER, MD;  Location: [facility] ENDOSCOPY;  Service: Endoscopy;  Laterality: N/A;   ESOPHAGOGASTRODUODENOSCOPY N/A 11/04/2017   Procedure: ESOPHAGOGASTRODUODENOSCOPY (EGD);  Surgeon: Gaylyn Gladis PENNER, MD;  Location: [facility] ENDOSCOPY;  Service: Endoscopy;  Laterality: N/A;   ESOPHAGOGASTRODUODENOSCOPY     ESOPHAGOGASTRODUODENOSCOPY (EGD) WITH PROPOFOL  N/A 05/30/2019   Procedure: ESOPHAGOGASTRODUODENOSCOPY (EGD) WITH PROPOFOL ;  Surgeon: Toledo, Ladell POUR, MD;  Location: [facility] ENDOSCOPY;  Service: Gastroenterology;  Laterality: N/A;   INCISION TENDON SHEATH HAND Left    JOINT REPLACEMENT     KNEE ARTHROSCOPY Left 12/03/2016   Procedure: ARTHROSCOPY KNEE, partial medial menisectomy;  Surgeon: Sharper Flake, MD;  Location: [facility];  Service: Orthopedics;  Laterality: Left;   KNEE ARTHROTOMY Left 10/21/2017   Procedure: KNEE ARTHROTOMY LEFT LATERAL RELEASE MEDIAL RETINACULART REPAIR POLY EXCHANGE;  Surgeon: Flake Sharper, MD;  Location: [facility];  Service: Orthopedics;  Laterality: Left;   KNEE CLOSED REDUCTION Left 04/15/2017   Procedure: CLOSED MANIPULATION KNEE;  Surgeon:  Kathlynn Sharper, MD;  Location: [facility];  Service: Orthopedics;  Laterality: Left;   LAPAROSCOPIC OVARIAN CYSTECTOMY Right    MENISCECTOMY Right    TOTAL KNEE ARTHROPLASTY Right 07/07/2016   Procedure: TOTAL KNEE ARTHROPLASTY;  Surgeon: Sharper Kathlynn, MD;  Location: [facility];  Service: Orthopedics;  Laterality: Right;   TOTAL KNEE ARTHROPLASTY Left 03/16/2017   Procedure: TOTAL KNEE ARTHROPLASTY;  Surgeon: Kathlynn Sharper, MD;   Location: [facility];  Service: Orthopedics;  Laterality: Left;   TUBAL LIGATION     Patient Active Problem List   Diagnosis Date Noted   Chronic pain syndrome 10/16/2019   Cervicalgia 10/16/2019   Chronic neck pain (1ry area of Pain) (Bilateral) (L>R) 10/16/2019   Chronic shoulder pain (Left) 10/16/2019   Chronic upper extremity pain (Bilateral) 10/16/2019   Chronic low back pain (Bilateral) w/o sciatica 10/16/2019   Chronic lower extremity pain (Bilateral) (R>L) 10/16/2019   Chronic knee pain after total replacement (Bilateral) (L>R) 10/16/2019   Pharmacologic therapy 10/16/2019   Disorder of skeletal system 10/16/2019   Problems influencing health status 10/16/2019   DDD (degenerative disc disease), cervical 10/16/2019   DDD (degenerative disc disease), lumbosacral 10/16/2019   Abnormal MRI, cervical spine (09/29/2019) 10/16/2019   Bursitis of left shoulder 08/10/2019   Left cervical radiculopathy 08/10/2019   Bilateral arm pain 01/11/2019   Vitamin D deficiency 05/03/2018   Nonrheumatic aortic valve insufficiency 02/28/2018   Non-rheumatic mitral regurgitation 02/28/2018   Non-rheumatic tricuspid valve insufficiency 02/28/2018   Heart palpitations 02/07/2018   Hypertension 02/07/2018   Chronic pain of wrist (Left) 10/27/2017   Myofascial muscle pain 10/27/2017   Synovitis of left knee 10/21/2017   Chest pain on breathing 05/27/2017   Dizziness 05/27/2017   Episodic lightheadedness 05/27/2017   Myalgia 05/12/2017   Primary localized osteoarthritis of left knee 03/16/2017   Surgical menopause, symptomatic 01/26/2017   Status post abdominal hysterectomy and left salpingo-oophorectomy 01/26/2017   S/P TAH (total abdominal hysterectomy) 01/20/2017   Leiomyoma uteri 01/18/2017   Primary localized osteoarthritis of right knee 07/07/2016   Abnormal hepatitis serology 04/10/2016   Polyarthralgia 04/03/2016   Bilateral anterior knee pain 04/03/2016   Anemia 10/23/2015    PCP:  Dr. Yancy Roof  REFERRING PROVIDER: Dr. Sharper Kathlynn  REFERRING DIAG: Left shoulder pain; Partial tear Left rotator cuff  THERAPY DIAG:  Acute pain of left shoulder  Impaired range of motion of left shoulder  Muscle weakness (generalized)  Rationale for Evaluation and Treatment: Rehabilitation  ONSET DATE: June-July 2025  SUBJECTIVE:  SUBJECTIVE STATEMENT: From Today: Patient states she tried the exercises but they were painful and made her arm very tired.  *INTERPRETER SERVICES PROVIDED TODAY- AMANDA from [facility] interpreter service   From EVAL: Patient reports Lots of pain in shoulder with difficulty lifting. She states pain began back in June/July- doing some rearranging in the home and was trying to move a mattress and felt a pop in her shoulder.   Hand dominance: Right  PERTINENT HISTORY: Patient presents with referral for PT for left shoulder pain and report of partial tear Left RC muscle. Per MRI report - Supraspinatus and MD wanted her to go to PT. Patient had injection left shoulder last week and reports overall worsening of pain since then. See above section for Medical history.   PAIN:  Are you having pain? Yes: NPRS scale: 6/10 current, 10/10 at worst Pain location: Left shoulder Pain description: sharp and radiating  Aggravating factors: active left arm movement; lifting Relieving factors: Rest/meds  PRECAUTIONS: None  RED FLAGS: None   WEIGHT BEARING RESTRICTIONS: No  FALLS:  Has patient fallen in last 6 months? 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 Enjoys  PLOF: Independent  PATIENT  GOALS: Move my arm without pain  NEXT MD VISIT:   OBJECTIVE:  Note: Objective measures were completed at Evaluation unless otherwise noted.  DIAGNOSTIC FINDINGS:  CLINICAL DATA:  Left shoulder pain   EXAM: MRI OF THE LEFT SHOULDER WITHOUT CONTRAST   TECHNIQUE: Multiplanar, multisequence MR imaging of the shoulder was performed. No intravenous contrast was administered.   COMPARISON:  None Available.   FINDINGS: Rotator cuff: Mild supraspinatus tendinosis with a tiny insertional interstitial tear. Moderate infraspinatus tendinosis. Teres minor tendon is intact. Subscapularis tendon is intact.   Muscles: No muscle atrophy or edema. No intramuscular fluid collection or hematoma.   Biceps Long Head: Intraarticular and extraarticular portions of the biceps tendon are intact.   Acromioclavicular Joint: Mild arthropathy of the acromioclavicular joint. No subacromial/subdeltoid bursal fluid.   Glenohumeral Joint: No joint effusion. No chondral defect.   Labrum: Grossly intact, but evaluation is limited by lack of intraarticular fluid/contrast.   Bones: No fracture or dislocation. No aggressive osseous lesion.   Other: No fluid collection or hematoma.   IMPRESSION: 1. Mild supraspinatus tendinosis with a tiny insertional interstitial tear. 2. Moderate infraspinatus tendinosis.     Electronically Signed   By: Julaine Blanch M.D.   On: 08/01/2024 15:30  PATIENT SURVEYS:  QUICK DASH      Please rate your ability do the following activities in the last week by selecting the number below the appropriate response.     Activities Rating  Open a tight or new jar.  5  Do heavy household chores (e.g., wash walls, floors). 2  Carry a shopping bag or briefcase 5  Wash your back. 4  Use a knife to cut food. 2  Recreational activities in which you take some force or impact through your arm, shoulder or hand (e.g., golf, hammering, tennis, etc.). 3  During the past week, to what  extent has your arm, shoulder or hand problem interfered with your normal social activities with family, friends, neighbors or groups?  3  During the past week, were you limited in your work or other regular daily activities as a result of your arm, shoulder or hand problem?  4  Rate the severity of the following symptoms in the last week: Arm, Shoulder, or hand pain. 4  Rate the severity of the following symptoms in the last week: Tingling (pins and needles) in your arm, shoulder or hand. 5  During the past week, how much difficulty have you had sleeping because of the pain in your arm, shoulder or hand?  4   41  (A QuickDASH score may not be calculated if there is greater than 1 missing item.) [phone]   [phone]  Quick Dash Disability/Symptom Score: [(sum of  (n) responses/ (n)] x 25 =  [phone]     Minimally Clinically Important Difference (MCID): 15-20 points    COGNITION: Overall cognitive status: Within functional limits for tasks assessed     SENSATION: Light touch: Numbness and tingling in left UE  POSTURE: Forward head and rounded shoulders  UPPER EXTREMITY ROM:   Active ROM Right eval Left eval  Shoulder flexion  142*   Shoulder extension    Shoulder abduction  82*  Shoulder adduction    Shoulder internal rotation  88  Shoulder external rotation  70*  Elbow flexion    Elbow extension    Wrist flexion    Wrist extension    Wrist ulnar deviation    Wrist radial deviation    Wrist pronation    Wrist supination    (Blank rows = not tested)  UPPER EXTREMITY MMT:  MMT Right eval Left eval  Shoulder flexion 5 2-  Shoulder extension    Shoulder abduction 5 2-  Shoulder adduction    Shoulder internal rotation 5 5  Shoulder external rotation 5 4  Middle trapezius    Lower trapezius    Elbow flexion 5 4+  Elbow extension 5 5  Wrist flexion    Wrist extension    Wrist ulnar deviation    Wrist radial deviation    Wrist pronation    Wrist supination     Grip strength (lbs)    (Blank rows = not tested)  SHOULDER SPECIAL TESTS: Impingement tests: Neer impingement test: positive  and Hawkins/Kennedy impingement test: positive  Instability tests: Load and shift test: negative and Sulcus sign: negative Rotator cuff assessment: Full can test: positive  and Infraspinatus test: negative Biceps assessment: Speed's test: positive   JOINT MOBILITY TESTING:  Guarded today with all joint mobility testing  PALPATION:  Most tender along Left superior lateral shoulder and down lateral upper arm. Mild tenderness along anterior shoulder as well.                                                                                                                              TREATMENT DATE: 08/23/2024  Pain= 5/10 throbbing- left anterior shoulder - down the outside of my upper arm.   Manual therapy:  -Long axis inferior pull x 5 sec hold x 10  -Mobilization with movement- Gentle grade 2-3 inf mobs with progressive left shoulder  abd- working in partial ROM- 0-45; 45-90; and 90-150  Therex:  Review of HEP- patient performed 10 reps with 3-5 sec hold with isometric shoulder abd and then ER. Added IR - hold 5 sec x 10 reps without any report of pain. Patient reported more fatigue than pain with shoulder ABD and no pain with shoulder ER.  Added shoulder Pulleys - shoulder ABD and provided pulley to patient today. X 20 reps  Added gentle pain-free shoulder abd (below 90 deg) x 10 reps and patient able to perform without any complaints.      PATIENT EDUCATION: Education details: Purpose of PT; Education of anatomy of RC muscle and how PT can benefit; Instruction in HEP; Proposed Plan of care Person educated: Patient Education method: Explanation Education comprehension: verbalized understanding, returned demonstration, verbal cues required, tactile cues required, and needs further education  HOME EXERCISE PROGRAM: Access Code: [email or web address] URL:  [email or web address] Date: 08/20/2024 Prepared by: Reyes London  Exercises - Standing Isometric Shoulder Abduction with Doorway - Arm Bent  - 1 x daily - 3 sets - 10 reps - 5 hold - Standing Isometric Shoulder External Rotation with Doorway  - 1 x daily - 7 x weekly - 3 sets - 10 reps  ASSESSMENT:  CLINICAL IMPRESSION: Patient is a 58 y.o. female  who was seen today for physical therapy treatment for Left shoulder pain and partial tear of Supraspinatus RC muscle. Patient presents with improved ROM from initial eval with manual therapy today. More complaint of fatigue with shoulder abd than pain and able to progress to some active movement below 90 deg and able to add pulleys without pain  Pt will benefit from PT services to address deficits in strength, mobility, and pain in order to return to full function at home with less shoulder pain.   OBJECTIVE IMPAIRMENTS: decreased ROM, decreased strength, impaired UE functional use, and pain.   ACTIVITY LIMITATIONS: carrying, lifting, bathing, dressing, and reach over head  PARTICIPATION LIMITATIONS: meal prep, cleaning, laundry, driving, shopping, community activity, yard work, and housework  PERSONAL FACTORS: 1-2 comorbidities: HTN, anxiety are also affecting patient's functional outcome.   REHAB POTENTIAL: Good  CLINICAL DECISION MAKING: Stable/uncomplicated  EVALUATION COMPLEXITY: Low   GOALS: Goals reviewed with patient? Yes  SHORT TERM GOALS: Target date: 09/28/2024  Pt will be independent with HEP in order to improve strength and decrease pain in order to improve pain-free function at home  Baseline: EVAL- No formal HEP in place  LONG TERM GOALS: Target date: 11/09/2024  Pt will decrease quick DASH score by at least 8% in order to demonstrate clinically significant reduction in disability. Baseline:  EVAL= 68% Goal status: INITIAL  2.  Pt will decrease worst pain as reported on NPRS by at least 3 points in  order to demonstrate clinically significant reduction in pain. Baseline: Worst L shoulder pain= 10/10 Goal status: INITIAL  3.  Patient will present with > 160 deg shoulder abduction AROM for improved functional movement of left shoulder.  Baseline:  Goal status: INITIAL  4.  Pt will increase strength of  by at least 1/2 MMT grade in order to demonstrate improvement in strength and function  Baseline: EVAL (see chart)   PLAN:  PT FREQUENCY: 1-2x/week  PT DURATION: 12 weeks  PLANNED INTERVENTIONS: [id number]- PT Re-evaluation, 97750- Physical Performance Testing, 97110-Therapeutic exercises, 97530- Therapeutic activity, [id number]- Neuromuscular re-education, 97535- Self Care, [id number]- Manual therapy, [id number]- Electrical stimulation (manual), 20560 (1-2 muscles), 20561 (3+ muscles)- Dry Needling, Patient/Family education, Taping,  Joint mobilization, Joint manipulation, Spinal manipulation, Spinal mobilization, Cryotherapy, and Moist heat  PLAN FOR NEXT SESSION:  Review and progress Left shoulder ROM/Strengthening and add to HEP as appropriate. Manual therapy for ROM/pain management Modalities as needed for Pain relief Progress to pain free resistive  RC strengthening next visit if appropriate.   Reyes LOISE London, PT 08/23/2024, 11:51 AM  "

## 2024-08-30 ENCOUNTER — Ambulatory Visit

## 2024-08-30 DIAGNOSIS — M25612 Stiffness of left shoulder, not elsewhere classified: Secondary | ICD-10-CM

## 2024-08-30 DIAGNOSIS — M6281 Muscle weakness (generalized): Secondary | ICD-10-CM

## 2024-08-30 DIAGNOSIS — M25512 Pain in left shoulder: Secondary | ICD-10-CM | POA: Diagnosis not present

## 2024-08-30 NOTE — Therapy (Signed)
 " OUTPATIENT PHYSICAL THERAPY TREATMENT  Patient Name: Caitlyn [name] MRN: [id number] DOB:July 29, 1966, 58 y.o., female Today's Date: 08/30/2024  END OF SESSION:  PT End of Session - 08/30/24 1155     Visit Number 3    Number of Visits 24    Date for Recertification  11/09/24    Progress Note Due on Visit 10    PT Start Time 1145    PT Stop Time 1225    PT Time Calculation (min) 40 min    Activity Tolerance Patient tolerated treatment well;Patient limited by pain    Behavior During Therapy WFL for tasks assessed/performed          Past Medical History:  Diagnosis Date   Anemia    Anxiety    Arthritis    Complication of anesthesia    a.) anesthesia awareness - has woken up several times during surgery in the past; could not move but could hear   COVID-19    DDD (degenerative disc disease), cervical    DDD (degenerative disc disease), lumbosacral    Depression    Dyspepsia    GERD (gastroesophageal reflux disease)    Heart burn    Heart murmur    Hypertension    Irregular menstrual cycle    Left cervical radiculopathy    Myalgia    Non-rheumatic mitral regurgitation    Non-rheumatic tricuspid valve insufficiency    Nonrheumatic aortic (valve) insufficiency    Stress incontinence    T2DM (type 2 diabetes mellitus) (HCC)    Transfusion of blood product refused for religious reason (Jehovah's witness)    Vitamin D deficiency    Past Surgical History:  Procedure Laterality Date   ABDOMINAL HYSTERECTOMY Left 01/18/2017   Procedure: HYSTERECTOMY ABDOMINAL WITH LEFT SALPINGO OOPHERECTOMY;  Surgeon: Kathe Gladis LABOR, MD;  Location: [facility];  Service: Gynecology;  Laterality: Left;   ANTERIOR AND POSTERIOR REPAIR N/A 06/07/2023   Procedure: ANTERIOR (CYSTOCELE) AND POSTERIOR REPAIR (RECTOCELE) WITH TOT;  Surgeon: Janit Alm Agent, MD;  Location: [facility];  Service: Gynecology;  Laterality: N/A;   BLADDER SUSPENSION N/A 06/07/2023   Procedure: TOT;  Surgeon:  Janit Alm Agent, MD;  Location: [facility];  Service: Gynecology;  Laterality: N/A;   CHOLECYSTECTOMY     CHOLECYSTECTOMY, LAPAROSCOPIC     COLONOSCOPY N/A 12/27/2023   Procedure: COLONOSCOPY;  Surgeon: Onita Elspeth Sharper, DO;  Location: [facility] ENDOSCOPY;  Service: Gastroenterology;  Laterality: N/A;  DM  (Spanish Interpreter needed)   COLONOSCOPY WITH PROPOFOL  N/A 11/04/2017   Procedure: COLONOSCOPY WITH PROPOFOL ;  Surgeon: Gaylyn Gladis PENNER, MD;  Location: [facility] ENDOSCOPY;  Service: Endoscopy;  Laterality: N/A;   ESOPHAGOGASTRODUODENOSCOPY N/A 11/04/2017   Procedure: ESOPHAGOGASTRODUODENOSCOPY (EGD);  Surgeon: Gaylyn Gladis PENNER, MD;  Location: [facility] ENDOSCOPY;  Service: Endoscopy;  Laterality: N/A;   ESOPHAGOGASTRODUODENOSCOPY     ESOPHAGOGASTRODUODENOSCOPY (EGD) WITH PROPOFOL  N/A 05/30/2019   Procedure: ESOPHAGOGASTRODUODENOSCOPY (EGD) WITH PROPOFOL ;  Surgeon: Toledo, Ladell POUR, MD;  Location: [facility] ENDOSCOPY;  Service: Gastroenterology;  Laterality: N/A;   INCISION TENDON SHEATH HAND Left    JOINT REPLACEMENT     KNEE ARTHROSCOPY Left 12/03/2016   Procedure: ARTHROSCOPY KNEE, partial medial menisectomy;  Surgeon: Sharper Flake, MD;  Location: [facility];  Service: Orthopedics;  Laterality: Left;   KNEE ARTHROTOMY Left 10/21/2017   Procedure: KNEE ARTHROTOMY LEFT LATERAL RELEASE MEDIAL RETINACULART REPAIR POLY EXCHANGE;  Surgeon: Flake Sharper, MD;  Location: [facility];  Service: Orthopedics;  Laterality: Left;   KNEE CLOSED REDUCTION Left 04/15/2017  Procedure: CLOSED MANIPULATION KNEE;  Surgeon: Kathlynn Sharper, MD;  Location: [facility];  Service: Orthopedics;  Laterality: Left;   LAPAROSCOPIC OVARIAN CYSTECTOMY Right    MENISCECTOMY Right    TOTAL KNEE ARTHROPLASTY Right 07/07/2016   Procedure: TOTAL KNEE ARTHROPLASTY;  Surgeon: Sharper Kathlynn, MD;  Location: [facility];  Service: Orthopedics;  Laterality: Right;   TOTAL KNEE ARTHROPLASTY Left 03/16/2017   Procedure: TOTAL KNEE ARTHROPLASTY;   Surgeon: Kathlynn Sharper, MD;  Location: [facility];  Service: Orthopedics;  Laterality: Left;   TUBAL LIGATION      PCP: Dr. Yancy Roof  REFERRING PROVIDER: Dr. Sharper Kathlynn  REFERRING DIAG: Left shoulder pain; Partial tear Left rotator cuff  THERAPY DIAG:  Acute pain of left shoulder  Impaired range of motion of left shoulder  Muscle weakness (generalized)  Rationale for Evaluation and Treatment: Rehabilitation  ONSET DATE: June-July 2025  SUBJECTIVE:                                                                                                                                                                                      SUBJECTIVE STATEMENT: Pain 4/10 today on arrival, particularly across the neck, a chronic issue, worse since having left shoulder injection.   PERTINENT HISTORY: Patient reports Lots of pain in shoulder with difficulty lifting. She states pain began back in June/July- doing some rearranging in the home and was trying to move a mattress and felt a pop in her shoulder.Patient presents with referral for PT for left shoulder pain and report of partial tear Left RC muscle. Per MRI report - Supraspinatus and MD wanted her to go to PT. Patient had injection left shoulder last week and reports overall worsening of pain since then. See above section for Medical history.   PAIN:  Are you having pain? 4/10 lef tshoulder; 4/10 across the neck posterior (bilat)   PRECAUTIONS: None  WEIGHT BEARING RESTRICTIONS: No  FALLS:  Has patient fallen in last 6 months? 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 Enjoys.  PLOF: Independent  PATIENT GOALS: Move my arm without pain  OBJECTIVE:  Note: Objective measures were completed at Evaluation unless otherwise noted.  DIAGNOSTIC  FINDINGS:  CLINICAL DATA:  Left shoulder pain   EXAM: MRI OF THE LEFT SHOULDER WITHOUT CONTRAST   TECHNIQUE: Multiplanar, multisequence MR imaging of the shoulder was performed. No intravenous contrast was administered.   COMPARISON:  None Available.   FINDINGS: Rotator cuff: Mild supraspinatus tendinosis with a tiny insertional interstitial tear. Moderate infraspinatus  tendinosis. Teres minor tendon is intact. Subscapularis tendon is intact.   Muscles: No muscle atrophy or edema. No intramuscular fluid collection or hematoma.   Biceps Long Head: Intraarticular and extraarticular portions of the biceps tendon are intact.   Acromioclavicular Joint: Mild arthropathy of the acromioclavicular joint. No subacromial/subdeltoid bursal fluid.   Glenohumeral Joint: No joint effusion. No chondral defect.   Labrum: Grossly intact, but evaluation is limited by lack of intraarticular fluid/contrast.   Bones: No fracture or dislocation. No aggressive osseous lesion.   Other: No fluid collection or hematoma.   IMPRESSION: 1. Mild supraspinatus tendinosis with a tiny insertional interstitial tear. 2. Moderate infraspinatus tendinosis.     Electronically Signed   By: Julaine Blanch M.D.   On: 08/01/2024 15:30  PATIENT SURVEYS:  QUICK DASH      Please rate your ability do the following activities in the last week by selecting the number below the appropriate response.     Activities Rating  Open a tight or new jar.  5  Do heavy household chores (e.g., wash walls, floors). 2  Carry a shopping bag or briefcase 5  Wash your back. 4  Use a knife to cut food. 2  Recreational activities in which you take some force or impact through your arm, shoulder or hand (e.g., golf, hammering, tennis, etc.). 3  During the past week, to what extent has your arm, shoulder or hand problem interfered with your normal social activities with family, friends, neighbors or groups?  3  During the past  week, were you limited in your work or other regular daily activities as a result of your arm, shoulder or hand problem? 4  Rate the severity of the following symptoms in the last week: Arm, Shoulder, or hand pain. 4  Rate the severity of the following symptoms in the last week: Tingling (pins and needles) in your arm, shoulder or hand. 5  During the past week, how much difficulty have you had sleeping because of the pain in your arm, shoulder or hand?  4   41  (A QuickDASH score may not be calculated if there is greater than 1 missing item.) [phone]   [phone]  Quick Dash Disability/Symptom Score: [(sum of  (n) responses/ (n)] x 25 =  [phone]     Minimally Clinically Important Difference (MCID): 15-20 points    COGNITION: Overall cognitive status: Within functional limits for tasks assessed     SENSATION: Light touch: Numbness and tingling in left UE  POSTURE: Forward head and rounded shoulders  UPPER EXTREMITY ROM:   Active ROM Right eval Left eval  Shoulder flexion  142*   Shoulder extension    Shoulder abduction  82*  Shoulder adduction    Shoulder internal rotation  88  Shoulder external rotation  70*  Elbow flexion    Elbow extension    Wrist flexion    Wrist extension    Wrist ulnar deviation    Wrist radial deviation    Wrist pronation    Wrist supination    (Blank rows = not tested)  UPPER EXTREMITY MMT:  MMT Right eval Left eval  Shoulder flexion 5 2-  Shoulder extension    Shoulder abduction 5 2-  Shoulder adduction    Shoulder internal rotation 5 5  Shoulder external rotation 5 4  Middle trapezius    Lower trapezius    Elbow flexion 5 4+  Elbow extension 5 5  (Blank rows = not tested)  SHOULDER SPECIAL  TESTS: Impingement tests: Neer impingement test: positive  and Hawkins/Kennedy impingement test: positive  Instability tests: Load and shift test: negative and Sulcus sign: negative Rotator cuff assessment: Full can test: positive  and  Infraspinatus test: negative Biceps assessment: Speed's test: positive   JOINT MOBILITY TESTING:  Guarded today with all joint mobility testing  PALPATION:  Most tender along Left superior lateral shoulder and down lateral upper arm. Mild tenderness along anterior shoulder as well.                                                                                                                              TREATMENT DATE: 08/30/2024  Pulleys x20 Moved to supine on plinth Isometric shoulder extension 12x3secH  Isometric shoulder adduction 12x3secH  Isometric shoulder ER 12x3secH  Isometric shoulder IR 12x3secH  Supine LUE chest press x10 (0lbs)  Isometric shoulder extension 12x3secH  Isometric shoulder adduction 12x3secH  Isometric shoulder ER 12x3secH  Isometric shoulder IR 12x3secH  Supine LUE chest press x10 (0lbs)  PATIENT EDUCATION: Education details: updated HEP instructions.  Person educated: Patient Education method: Explanation Education comprehension: verbalized understanding, returned demonstration, verbal cues required, tactile cues required, and needs further education  HOME EXERCISE PROGRAM: Access Code: [email or web address] URL: [email or web address] Date: 08/30/2024 Prepared by: Peggye Linear  Exercises - Standing Isometric Shoulder Abduction with Doorway - Arm Bent  - 2-3 x daily - 1 sets - 10 reps - 5seg hold - Standing Isometric Shoulder External Rotation with Doorway  - 2-3 x weekly - 1 sets - 10 reps - 5seg hold - Standing Isometric Shoulder Internal Rotation at Doorway  - 2-3 x weekly - 1 sets - 10 reps - 5seg hold - Isometric Shoulder Extension at Wall  - 2-3 x daily - 1 sets - 10 reps - 5seg hold - Standing Isometric Shoulder Adduction with Towel Roll  - 2-3 x daily - 1 sets - 10 reps - 5seg hold - Seated Shoulder Flexion AAROM with Pulley Behind  - 2-3 x daily - 1 sets - 20 reps - 3seg hold  ASSESSMENT:  CLINICAL IMPRESSION: Reviewed HEP and  provided updated handout. Pain remains stable in session with exception to a few intermittent exacerbations I suspect muscle cramp. Pt reports fatigue with exercises. Patient will benefit from skilled physical therapy intervention to reduce deficits and impairments identified in evaluation, in order to reduce pain, improve quality of life, and maximize activity tolerance for ADL, IADL, and leisure/fitness. Physical therapy will help pt achieve long and short term goals of care.     OBJECTIVE IMPAIRMENTS: decreased ROM, decreased strength, impaired UE functional use, and pain.   ACTIVITY LIMITATIONS: carrying, lifting, bathing, dressing, and reach over head  PARTICIPATION LIMITATIONS: meal prep, cleaning, laundry, driving, shopping, community activity, yard work, and housework  PERSONAL FACTORS: 1-2 comorbidities: HTN, anxiety are also affecting patient's functional outcome.   REHAB POTENTIAL: Good  CLINICAL DECISION MAKING: Stable/uncomplicated  EVALUATION COMPLEXITY: Low   GOALS:  Goals reviewed with patient? Yes  SHORT TERM GOALS: Target date: 09/28/2024  Pt will be independent with HEP in order to improve strength and decrease pain in order to improve pain-free function at home  Baseline: EVAL- No formal HEP in place  LONG TERM GOALS: Target date: 11/09/2024  Pt will decrease quick DASH score by at least 8% in order to demonstrate clinically significant reduction in disability. Baseline:  EVAL= 68% Goal status: INITIAL  2.  Pt will decrease worst pain as reported on NPRS by at least 3 points in order to demonstrate clinically significant reduction in pain. Baseline: Worst L shoulder pain= 10/10 Goal status: INITIAL  3.  Patient will present with > 160 deg shoulder abduction AROM for improved functional movement of left shoulder.  Baseline:  Goal status: INITIAL  4.  Pt will increase strength of  by at least 1/2 MMT grade in order to demonstrate improvement in strength and  function  Baseline: EVAL (see chart)   PLAN:  PT FREQUENCY: 1-2x/week  PT DURATION: 12 weeks  PLANNED INTERVENTIONS: [id number]- PT Re-evaluation, 97750- Physical Performance Testing, 97110-Therapeutic exercises, 97530- Therapeutic activity, [id number]- Neuromuscular re-education, 97535- Self Care, [id number]- Manual therapy, [id number]- Electrical stimulation (manual), 20560 (1-2 muscles), 20561 (3+ muscles)- Dry Needling, Patient/Family education, Taping, Joint mobilization, Joint manipulation, Spinal manipulation, Spinal mobilization, Cryotherapy, and Moist heat  PLAN FOR NEXT SESSION:  Review and progress Left shoulder ROM/Strengthening and add to HEP as appropriate. Manual therapy for ROM/pain management Modalities as needed for Pain relief Progress to pain free resistive  RC strengthening next visit if appropriate.   12:45 PM, 08/30/2024 Peggye JAYSON Linear, PT, DPT Physical Therapist - [facility]  Outpatient Physical Therapy- Main Campus [phone]    "

## 2024-09-03 ENCOUNTER — Other Ambulatory Visit: Payer: Self-pay | Admitting: Sleep Medicine

## 2024-09-03 DIAGNOSIS — F5104 Psychophysiologic insomnia: Secondary | ICD-10-CM

## 2024-09-04 ENCOUNTER — Ambulatory Visit: Attending: Orthopedic Surgery

## 2024-09-04 DIAGNOSIS — M25512 Pain in left shoulder: Secondary | ICD-10-CM | POA: Insufficient documentation

## 2024-09-04 DIAGNOSIS — M25612 Stiffness of left shoulder, not elsewhere classified: Secondary | ICD-10-CM | POA: Insufficient documentation

## 2024-09-04 DIAGNOSIS — M6281 Muscle weakness (generalized): Secondary | ICD-10-CM | POA: Insufficient documentation

## 2024-09-04 NOTE — Therapy (Signed)
 " OUTPATIENT PHYSICAL THERAPY TREATMENT  Patient Name: Caitlyn [name] MRN: [id number] DOB:[birth year], 59 y.o., female Today's Date: 09/04/2024  END OF SESSION:  PT End of Session - 09/04/24 0941     Visit Number 4    Number of Visits 24    Date for Recertification  11/09/24    Progress Note Due on Visit 10    PT Start Time 0935    PT Stop Time 1015    PT Time Calculation (min) 40 min    Activity Tolerance Patient tolerated treatment well;Patient limited by pain    Behavior During Therapy [facility] for tasks assessed/performed          Past Medical History:  Diagnosis Date   Anemia    Anxiety    Arthritis    Complication of anesthesia    a.) anesthesia awareness - has woken up several times during surgery in the past; could not move but could hear   COVID-19    DDD (degenerative disc disease), cervical    DDD (degenerative disc disease), lumbosacral    Depression    Dyspepsia    GERD (gastroesophageal reflux disease)    Heart burn    Heart murmur    Hypertension    Irregular menstrual cycle    Left cervical radiculopathy    Myalgia    Non-rheumatic mitral regurgitation    Non-rheumatic tricuspid valve insufficiency    Nonrheumatic aortic (valve) insufficiency    Stress incontinence    T2DM (type 2 diabetes mellitus) (HCC)    Transfusion of blood product refused for religious reason (Jehovah's witness)    Vitamin D deficiency    Past Surgical History:  Procedure Laterality Date   ABDOMINAL HYSTERECTOMY Left 01/18/2017   Procedure: HYSTERECTOMY ABDOMINAL WITH LEFT SALPINGO OOPHERECTOMY;  Surgeon: Kathe Gladis LABOR, MD;  Location: [facility] ORS;  Service: Gynecology;  Laterality: Left;   ANTERIOR AND POSTERIOR REPAIR N/A 06/07/2023   Procedure: ANTERIOR (CYSTOCELE) AND POSTERIOR REPAIR (RECTOCELE) WITH TOT;  Surgeon: Janit Alm Agent, MD;  Location: [facility];  Service: Gynecology;  Laterality: N/A;   BLADDER SUSPENSION N/A 06/07/2023   Procedure: TOT;  Surgeon:  Janit Alm Agent, MD;  Location: [facility];  Service: Gynecology;  Laterality: N/A;   CHOLECYSTECTOMY     CHOLECYSTECTOMY, LAPAROSCOPIC     COLONOSCOPY N/A 12/27/2023   Procedure: COLONOSCOPY;  Surgeon: Onita Elspeth Sharper, DO;  Location: [facility] ENDOSCOPY;  Service: Gastroenterology;  Laterality: N/A;  DM  (Spanish Interpreter needed)   COLONOSCOPY WITH PROPOFOL  N/A 11/04/2017   Procedure: COLONOSCOPY WITH PROPOFOL ;  Surgeon: Gaylyn Gladis PENNER, MD;  Location: [facility] ENDOSCOPY;  Service: Endoscopy;  Laterality: N/A;   ESOPHAGOGASTRODUODENOSCOPY N/A 11/04/2017   Procedure: ESOPHAGOGASTRODUODENOSCOPY (EGD);  Surgeon: Gaylyn Gladis PENNER, MD;  Location: [facility] ENDOSCOPY;  Service: Endoscopy;  Laterality: N/A;   ESOPHAGOGASTRODUODENOSCOPY     ESOPHAGOGASTRODUODENOSCOPY (EGD) WITH PROPOFOL  N/A 05/30/2019   Procedure: ESOPHAGOGASTRODUODENOSCOPY (EGD) WITH PROPOFOL ;  Surgeon: Toledo, Ladell POUR, MD;  Location: [facility] ENDOSCOPY;  Service: Gastroenterology;  Laterality: N/A;   INCISION TENDON SHEATH HAND Left    JOINT REPLACEMENT     KNEE ARTHROSCOPY Left 12/03/2016   Procedure: ARTHROSCOPY KNEE, partial medial menisectomy;  Surgeon: Sharper Flake, MD;  Location: [facility] OR[facility];  Service: Orthopedics;  Laterality: Left;   KNEE ARTHROTOMY Left 10/21/2017   Procedure: KNEE ARTHROTOMY LEFT LATERAL RELEASE MEDIAL RETINACULART REPAIR POLY EXCHANGE;  Surgeon: Flake Sharper, MD;  Location: [facility];  Service: Orthopedics;  Laterality: Left;   KNEE CLOSED REDUCTION Left 04/15/2017  Procedure: CLOSED MANIPULATION KNEE;  Surgeon: Kathlynn Sharper, MD;  Location: [facility];  Service: Orthopedics;  Laterality: Left;   LAPAROSCOPIC OVARIAN CYSTECTOMY Right    MENISCECTOMY Right    TOTAL KNEE ARTHROPLASTY Right 07/07/2016   Procedure: TOTAL KNEE ARTHROPLASTY;  Surgeon: Sharper Kathlynn, MD;  Location: [facility];  Service: Orthopedics;  Laterality: Right;   TOTAL KNEE ARTHROPLASTY Left 03/16/2017   Procedure: TOTAL KNEE ARTHROPLASTY;   Surgeon: Kathlynn Sharper, MD;  Location: [facility];  Service: Orthopedics;  Laterality: Left;   TUBAL LIGATION      PCP: Dr. Yancy Roof  REFERRING PROVIDER: Dr. Sharper Kathlynn  REFERRING DIAG: Left shoulder pain; Partial tear Left rotator cuff  THERAPY DIAG:  Acute pain of left shoulder  Impaired range of motion of left shoulder  Muscle weakness (generalized)  Rationale for Evaluation and Treatment: Rehabilitation  ONSET DATE: June-July 2025  SUBJECTIVE:                                                                                                                                                                                      SUBJECTIVE STATEMENT: Pain 4/10 today on arrival. No major updates.  PERTINENT HISTORY: Patient reports Lots of pain in shoulder with difficulty lifting. She states pain began back in June/July- doing some rearranging in the home and was trying to move a mattress and felt a pop in her shoulder.Patient presents with referral for PT for left shoulder pain and report of partial tear Left RC muscle. Per MRI report - Supraspinatus and MD wanted her to go to PT. Patient had injection left shoulder last week and reports overall worsening of pain since then. See above section for Medical history.   PAIN:  Are you having pain? 4/10 left shoulder; 4/10 across the neck posterior (bilat)   PRECAUTIONS: None  WEIGHT BEARING RESTRICTIONS: No  FALLS:  Has patient fallen in last 6 months? 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 Enjoys.  PLOF: Independent  PATIENT GOALS: Move my arm without pain  OBJECTIVE:  Note: Objective measures were completed at Evaluation unless otherwise noted.  DIAGNOSTIC FINDINGS:  CLINICAL DATA:  Left shoulder pain   EXAM: MRI OF THE LEFT  SHOULDER WITHOUT CONTRAST   TECHNIQUE: Multiplanar, multisequence MR imaging of the shoulder was performed. No intravenous contrast was administered.   COMPARISON:  None Available.   FINDINGS: Rotator cuff: Mild supraspinatus tendinosis with a tiny insertional interstitial tear. Moderate infraspinatus tendinosis. Teres minor tendon is intact. Subscapularis tendon is intact.  Muscles: No muscle atrophy or edema. No intramuscular fluid collection or hematoma.   Biceps Long Head: Intraarticular and extraarticular portions of the biceps tendon are intact.   Acromioclavicular Joint: Mild arthropathy of the acromioclavicular joint. No subacromial/subdeltoid bursal fluid.   Glenohumeral Joint: No joint effusion. No chondral defect.   Labrum: Grossly intact, but evaluation is limited by lack of intraarticular fluid/contrast.   Bones: No fracture or dislocation. No aggressive osseous lesion.   Other: No fluid collection or hematoma.   IMPRESSION: 1. Mild supraspinatus tendinosis with a tiny insertional interstitial tear. 2. Moderate infraspinatus tendinosis.     Electronically Signed   By: Julaine Blanch M.D.   On: 08/01/2024 15:30  PATIENT SURVEYS:  QUICK DASH      Please rate your ability do the following activities in the last week by selecting the number below the appropriate response.     Activities Rating  Open a tight or new jar.  5  Do heavy household chores (e.g., wash walls, floors). 2  Carry a shopping bag or briefcase 5  Wash your back. 4  Use a knife to cut food. 2  Recreational activities in which you take some force or impact through your arm, shoulder or hand (e.g., golf, hammering, tennis, etc.). 3  During the past week, to what extent has your arm, shoulder or hand problem interfered with your normal social activities with family, friends, neighbors or groups?  3  During the past week, were you limited in your work or other regular daily activities as a  result of your arm, shoulder or hand problem? 4  Rate the severity of the following symptoms in the last week: Arm, Shoulder, or hand pain. 4  Rate the severity of the following symptoms in the last week: Tingling (pins and needles) in your arm, shoulder or hand. 5  During the past week, how much difficulty have you had sleeping because of the pain in your arm, shoulder or hand?  4   41  (A QuickDASH score may not be calculated if there is greater than 1 missing item.) [phone]   [phone]  Quick Dash Disability/Symptom Score: [(sum of  (n) responses/ (n)] x 25 =  [phone]     Minimally Clinically Important Difference (MCID): 15-20 points    COGNITION: Overall cognitive status: Within functional limits for tasks assessed     SENSATION: Light touch: Numbness and tingling in left UE  POSTURE: Forward head and rounded shoulders  UPPER EXTREMITY ROM:   Active ROM Right eval Left eval  Shoulder flexion  142*   Shoulder extension    Shoulder abduction  82*  Shoulder adduction    Shoulder internal rotation  88  Shoulder external rotation  70*  (Blank rows = not tested)  UPPER EXTREMITY MMT:  MMT Right eval Left eval  Shoulder flexion 5 2-  Shoulder extension    Shoulder abduction 5 2-  Shoulder adduction    Shoulder internal rotation 5 5  Shoulder external rotation 5 4  Middle trapezius    Lower trapezius    Elbow flexion 5 4+  Elbow extension 5 5  (Blank rows = not tested)  SHOULDER SPECIAL TESTS: Impingement tests: Neer impingement test: positive  and Hawkins/Kennedy impingement test: positive  Instability tests: Load and shift test: negative and Sulcus sign: negative Rotator cuff assessment: Full can test: positive  and Infraspinatus test: negative Biceps assessment: Speed's test: positive   JOINT MOBILITY TESTING:  Guarded today with all joint mobility  testing  PALPATION:  Most tender along Left superior lateral shoulder and down lateral upper arm. Mild  tenderness along anterior shoulder as well.                                                                                                                              TREATMENT DATE: 09/04/2024  Pulleys x25  Moved to supine on plinth  Isometric shoulder extension 12x3secH  Left shoulder flexion 1x15 (elbow supported at 0 degrees)  Isometric shoulder ER 15x3secH (gait belt resistance in neutral rotation)  Isometric shoulder adduction 15x3secH  (axillary towel roll) might make cramping worse  Isometric shoulder IR 15x3secH (bilat c PVC resistance)  Long axis distraction x90sec in open packed joint position)   *heat added to posterior shoulder Isometric shoulder extension 12x3secH  Left shoulder flexion 1x15 (elbow supported at 0 degrees)  Isometric shoulder ER 15x3secH  Long axis distraction x90sec in open packed joint position)   -BUE elbow extension 1x12 (RTB) -Left elbow fleixon 2lb weight 1x12   PATIENT EDUCATION: Education details: updated HEP instructions.  Person educated: Patient Education method: Explanation Education comprehension: verbalized understanding, returned demonstration, verbal cues required, tactile cues required, and needs further education  HOME EXERCISE PROGRAM: Access Code: [email or web address] URL: [email or web address] Date: 08/30/2024 Prepared by: Peggye Linear  Exercises - Standing Isometric Shoulder Abduction with Doorway - Arm Bent  - 2-3 x daily - 1 sets - 10 reps - 5seg hold - Standing Isometric Shoulder External Rotation with Doorway  - 2-3 x weekly - 1 sets - 10 reps - 5seg hold - Standing Isometric Shoulder Internal Rotation at Doorway  - 2-3 x weekly - 1 sets - 10 reps - 5seg hold - Isometric Shoulder Extension at Wall  - 2-3 x daily - 1 sets - 10 reps - 5seg hold - Standing Isometric Shoulder Adduction with Towel Roll  - 2-3 x daily - 1 sets - 10 reps - 5seg hold - Seated Shoulder Flexion AAROM with Pulley Behind  - 2-3 x daily - 1 sets  - 20 reps - 3seg hold  ASSESSMENT:  CLINICAL IMPRESSION: Continued with isometric loading in 6 directions, except for flexion which is tolerated in supine short lever positions. Pt has muscular ache with many of these exercises, but does well with additional breaks and stretch. Patient will benefit from skilled physical therapy intervention to reduce deficits and impairments identified in evaluation, in order to reduce pain, improve quality of life, and maximize activity tolerance for ADL, IADL, and leisure/fitness. Physical therapy will help pt achieve long and short term goals of care.     OBJECTIVE IMPAIRMENTS: decreased ROM, decreased strength, impaired UE functional use, and pain.   ACTIVITY LIMITATIONS: carrying, lifting, bathing, dressing, and reach over head  PARTICIPATION LIMITATIONS: meal prep, cleaning, laundry, driving, shopping, community activity, yard work, and housework  PERSONAL FACTORS: 1-2 comorbidities: HTN, anxiety are also affecting patient's functional outcome.   REHAB POTENTIAL: Good  CLINICAL  DECISION MAKING: Stable/uncomplicated  EVALUATION COMPLEXITY: Low   GOALS: Goals reviewed with patient? Yes  SHORT TERM GOALS: Target date: 09/28/2024  Pt will be independent with HEP in order to improve strength and decrease pain in order to improve pain-free function at home  Baseline: EVAL- No formal HEP in place  LONG TERM GOALS: Target date: 11/09/2024  Pt will decrease quick DASH score by at least 8% in order to demonstrate clinically significant reduction in disability. Baseline:  EVAL= 68% Goal status: INITIAL  2.  Pt will decrease worst pain as reported on NPRS by at least 3 points in order to demonstrate clinically significant reduction in pain. Baseline: Worst L shoulder pain= 10/10 Goal status: INITIAL  3.  Patient will present with > 160 deg shoulder abduction AROM for improved functional movement of left shoulder.  Baseline:  Goal status:  INITIAL  4.  Pt will increase strength of  by at least 1/2 MMT grade in order to demonstrate improvement in strength and function  Baseline: EVAL (see chart)   PLAN:  PT FREQUENCY: 1-2x/week  PT DURATION: 12 weeks  PLANNED INTERVENTIONS: 97164- PT Re-evaluation, 97750- Physical Performance Testing, 97110-Therapeutic exercises, 97530- Therapeutic activity, [id number]- Neuromuscular re-education, 97535- Self Care, [id number]- Manual therapy, [id number]- Electrical stimulation (manual), 20560 (1-2 muscles), 20561 (3+ muscles)- Dry Needling, Patient/Family education, Taping, Joint mobilization, Joint manipulation, Spinal manipulation, Spinal mobilization, Cryotherapy, and Moist heat  PLAN FOR NEXT SESSION:  Review and progress Left shoulder ROM/Strengthening and add to HEP as appropriate. Manual therapy for ROM/pain management Modalities as needed for Pain relief Progress to pain free resistive  RC strengthening next visit if appropriate.   9:48 AM, 09/04/2024 Peggye JAYSON Linear, PT, DPT Physical Therapist - [facility]  Outpatient Physical Therapy- Main [facility] [phone]    "

## 2024-09-06 ENCOUNTER — Ambulatory Visit

## 2024-09-06 NOTE — Therapy (Deleted)
 " OUTPATIENT PHYSICAL THERAPY TREATMENT  Patient Name: Caitlyn [name] MRN: [id number] DOB:1966-02-06, 59 y.o., female Today's Date: 09/06/2024  END OF SESSION:    Past Medical History:  Diagnosis Date   Anemia    Anxiety    Arthritis    Complication of anesthesia    a.) anesthesia awareness - has woken up several times during surgery in the past; could not move but could hear   COVID-19    DDD (degenerative disc disease), cervical    DDD (degenerative disc disease), lumbosacral    Depression    Dyspepsia    GERD (gastroesophageal reflux disease)    Heart burn    Heart murmur    Hypertension    Irregular menstrual cycle    Left cervical radiculopathy    Myalgia    Non-rheumatic mitral regurgitation    Non-rheumatic tricuspid valve insufficiency    Nonrheumatic aortic (valve) insufficiency    Stress incontinence    T2DM (type 2 diabetes mellitus) (HCC)    Transfusion of blood product refused for religious reason (Jehovah's witness)    Vitamin D deficiency    Past Surgical History:  Procedure Laterality Date   ABDOMINAL HYSTERECTOMY Left 01/18/2017   Procedure: HYSTERECTOMY ABDOMINAL WITH LEFT SALPINGO OOPHERECTOMY;  Surgeon: Kathe Gladis LABOR, MD;  Location: [facility];  Service: Gynecology;  Laterality: Left;   ANTERIOR AND POSTERIOR REPAIR N/A 06/07/2023   Procedure: ANTERIOR (CYSTOCELE) AND POSTERIOR REPAIR (RECTOCELE) WITH TOT;  Surgeon: Janit Alm Agent, MD;  Location: [facility];  Service: Gynecology;  Laterality: N/A;   BLADDER SUSPENSION N/A 06/07/2023   Procedure: TOT;  Surgeon: Janit Alm Agent, MD;  Location: [facility];  Service: Gynecology;  Laterality: N/A;   CHOLECYSTECTOMY     CHOLECYSTECTOMY, LAPAROSCOPIC     COLONOSCOPY N/A 12/27/2023   Procedure: COLONOSCOPY;  Surgeon: Onita Elspeth Sharper, DO;  Location[facility] END[facility]COPY;  Service: Gastroenterology;  Laterality: N/A;  DM  (Spanish Interpreter needed)   COLONOSCOPY WITH PROPOFOL  N/A 11/04/2017    Procedure: COLONOSCOPY WITH PROPOFOL ;  Surgeon: Gaylyn Gladis PENNER, MD;  Location: [facility] ENDOSCOPY;  Service: Endoscopy;  Laterality: N/A;   ESOPHAGOGASTRODUODENOSCOPY N/A 11/04/2017   Procedure: ESOPHAGOGASTRODUODENOSCOPY (EGD);  Surgeon: Gaylyn Gladis PENNER, MD;  Location: [facility] ENDOSCOPY;  Service: Endoscopy;  Laterality: N/A;   ESOPHAGOGASTRODUODENOSCOPY     ESOPHAGOGASTRODUODENOSCOPY (EGD) WITH PROPOFOL  N/A 05/30/2019   Procedure: ESOPHAGOGASTRODUODENOSCOPY (EGD) WITH PROPOFOL ;  Surgeon: Toledo, Ladell POUR, MD;  Location: [facility] ENDOSCOPY;  Service: Gastroenterology;  Laterality: N/A;   INCISION TENDON SHEATH HAND Left    JOINT REPLACEMENT     KNEE ARTHROSCOPY Left 12/03/2016   Procedure: ARTHROSCOPY KNEE, partial medial menisectomy;  Surgeon: Sharper Flake, MD;  Location: [facility];  Service: Orthopedics;  Laterality: Left;   KNEE ARTHROTOMY Left 10/21/2017   Procedure: KNEE ARTHROTOMY LEFT LATERAL RELEASE MEDIAL RETINACULART REPAIR POLY EXCHANGE;  Surgeon: Flake Sharper, MD;  Location: [facility];  Service: Orthopedics;  Laterality: Left;   KNEE CLOSED REDUCTION Left 04/15/2017   Procedure: CLOSED MANIPULATION KNEE;  Surgeon: Flake Sharper, MD;  Location: [facility];  Service: Orthopedics;  Laterality: Left;   LAPAROSCOPIC OVARIAN CYSTECTOMY Right    MENISCECTOMY Right    TOTAL KNEE ARTHROPLASTY Right 07/07/2016   Procedure: TOTAL KNEE ARTHROPLASTY;  Surgeon: Sharper Flake, MD;  Location: [facility];  Service: Orthopedics;  Laterality: Right;   TOTAL KNEE ARTHROPLASTY Left 03/16/2017   Procedure: TOTAL KNEE ARTHROPLASTY;  Surgeon: Flake Sharper, MD;  Location: [facility];  Service: Orthopedics;  Laterality: Left;  TUBAL LIGATION      PCP: Dr. Yancy Roof  REFERRING PROVIDER: Dr. Ozell Flake  REFERRING DIAG: Left shoulder pain; Partial tear Left rotator cuff  THERAPY DIAG:  Acute pain of left shoulder  Impaired range of motion of left shoulder  Rationale for Evaluation and Treatment:  Rehabilitation  ONSET DATE: June-July 2025  SUBJECTIVE:                                                                                                                                                                                      SUBJECTIVE STATEMENT: Pain 4/10 today on arrival. No major updates.  PERTINENT HISTORY: Patient reports Lots of pain in shoulder with difficulty lifting. She states pain began back in June/July- doing some rearranging in the home and was trying to move a mattress and felt a pop in her shoulder.Patient presents with referral for PT for left shoulder pain and report of partial tear Left RC muscle. Per MRI report - Supraspinatus and MD wanted her to go to PT. Patient had injection left shoulder last week and reports overall worsening of pain since then. See above section for Medical history.   PAIN:  Are you having pain? 4/10 left shoulder; 4/10 across the neck posterior (bilat)   PRECAUTIONS: None  WEIGHT BEARING RESTRICTIONS: No  FALLS:  Has patient fallen in last 6 months? 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 Enjoys.  PLOF: Independent  PATIENT GOALS: Move my arm without pain  OBJECTIVE:  Note: Objective measures were completed at Evaluation unless otherwise noted.  DIAGNOSTIC FINDINGS:  CLINICAL DATA:  Left shoulder pain   EXAM: MRI OF THE LEFT SHOULDER WITHOUT CONTRAST   TECHNIQUE: Multiplanar, multisequence MR imaging of the shoulder was performed. No intravenous contrast was administered.   COMPARISON:  None Available.   FINDINGS: Rotator cuff: Mild supraspinatus tendinosis with a tiny insertional interstitial tear. Moderate infraspinatus tendinosis. Teres minor tendon is intact. Subscapularis tendon is intact.   Muscles: No muscle atrophy or  edema. No intramuscular fluid collection or hematoma.   Biceps Long Head: Intraarticular and extraarticular portions of the biceps tendon are intact.   Acromioclavicular Joint: Mild arthropathy of the acromioclavicular joint. No subacromial/subdeltoid bursal fluid.   Glenohumeral Joint: No joint effusion. No chondral defect.   Labrum: Grossly intact, but evaluation is limited by lack of intraarticular fluid/contrast.   Bones: No fracture or dislocation. No aggressive osseous lesion.   Other: No fluid collection or hematoma.   IMPRESSION: 1. Mild supraspinatus tendinosis with  a tiny insertional interstitial tear. 2. Moderate infraspinatus tendinosis.     Electronically Signed   By: Julaine Blanch M.D.   On: 08/01/2024 15:30  PATIENT SURVEYS:  QUICK DASH      Please rate your ability do the following activities in the last week by selecting the number below the appropriate response.     Activities Rating  Open a tight or new jar.  5  Do heavy household chores (e.g., wash walls, floors). 2  Carry a shopping bag or briefcase 5  Wash your back. 4  Use a knife to cut food. 2  Recreational activities in which you take some force or impact through your arm, shoulder or hand (e.g., golf, hammering, tennis, etc.). 3  During the past week, to what extent has your arm, shoulder or hand problem interfered with your normal social activities with family, friends, neighbors or groups?  3  During the past week, were you limited in your work or other regular daily activities as a result of your arm, shoulder or hand problem? 4  Rate the severity of the following symptoms in the last week: Arm, Shoulder, or hand pain. 4  Rate the severity of the following symptoms in the last week: Tingling (pins and needles) in your arm, shoulder or hand. 5  During the past week, how much difficulty have you had sleeping because of the pain in your arm, shoulder or hand?  4   41  (A QuickDASH score may not  be calculated if there is greater than 1 missing item.) [id number]   [phone]  Quick Dash Disability/Symptom Score: [(sum of  (n) responses/ (n)] x 25 =  [phone]     Minimally Clinically Important Difference (MCID): 15-20 points    COGNITION: Overall cognitive status: Within functional limits for tasks assessed     SENSATION: Light touch: Numbness and tingling in left UE  POSTURE: Forward head and rounded shoulders  UPPER EXTREMITY ROM:   Active ROM Right eval Left eval  Shoulder flexion  142*   Shoulder extension    Shoulder abduction  82*  Shoulder adduction    Shoulder internal rotation  88  Shoulder external rotation  70*  (Blank rows = not tested)  UPPER EXTREMITY MMT:  MMT Right eval Left eval  Shoulder flexion 5 2-  Shoulder extension    Shoulder abduction 5 2-  Shoulder adduction    Shoulder internal rotation 5 5  Shoulder external rotation 5 4  Middle trapezius    Lower trapezius    Elbow flexion 5 4+  Elbow extension 5 5  (Blank rows = not tested)  SHOULDER SPECIAL TESTS: Impingement tests: Neer impingement test: positive  and Hawkins/Kennedy impingement test: positive  Instability tests: Load and shift test: negative and Sulcus sign: negative Rotator cuff assessment: Full can test: positive  and Infraspinatus test: negative Biceps assessment: Speed's test: positive   JOINT MOBILITY TESTING:  Guarded today with all joint mobility testing  PALPATION:  Most tender along Left superior lateral shoulder and down lateral upper arm. Mild tenderness along anterior shoulder as well.  TREATMENT DATE: 09/06/2024  Pulleys x25  Moved to supine on plinth  Isometric shoulder extension 12x3secH  Left shoulder flexion 1x15 (elbow supported at 0 degrees)  Isometric shoulder ER 15x3secH (gait belt resistance in neutral rotation)   Isometric shoulder adduction 15x3secH  (axillary towel roll) might make cramping worse  Isometric shoulder IR 15x3secH (bilat c PVC resistance)  Long axis distraction x90sec in open packed joint position)   *heat added to posterior shoulder Isometric shoulder extension 12x3secH  Left shoulder flexion 1x15 (elbow supported at 0 degrees)  Isometric shoulder ER 15x3secH  Long axis distraction x90sec in open packed joint position)   -BUE elbow extension 1x12 (RTB) -Left elbow fleixon 2lb weight 1x12   PATIENT EDUCATION: Education details: updated HEP instructions.  Person educated: Patient Education method: Explanation Education comprehension: verbalized understanding, returned demonstration, verbal cues required, tactile cues required, and needs further education  HOME EXERCISE PROGRAM: Access Code: [email or web address] URL: [email or web address] Date: 08/30/2024 Prepared by: Peggye Linear  Exercises - Standing Isometric Shoulder Abduction with Doorway - Arm Bent  - 2-3 x daily - 1 sets - 10 reps - 5seg hold - Standing Isometric Shoulder External Rotation with Doorway  - 2-3 x weekly - 1 sets - 10 reps - 5seg hold - Standing Isometric Shoulder Internal Rotation at Doorway  - 2-3 x weekly - 1 sets - 10 reps - 5seg hold - Isometric Shoulder Extension at Wall  - 2-3 x daily - 1 sets - 10 reps - 5seg hold - Standing Isometric Shoulder Adduction with Towel Roll  - 2-3 x daily - 1 sets - 10 reps - 5seg hold - Seated Shoulder Flexion AAROM with Pulley Behind  - 2-3 x daily - 1 sets - 20 reps - 3seg hold  ASSESSMENT:  CLINICAL IMPRESSION: Continued with isometric loading in 6 directions, except for flexion which is tolerated in supine short lever positions. Pt has muscular ache with many of these exercises, but does well with additional breaks and stretch. Patient will benefit from skilled physical therapy intervention to reduce deficits and impairments identified in evaluation, in  order to reduce pain, improve quality of life, and maximize activity tolerance for ADL, IADL, and leisure/fitness. Physical therapy will help pt achieve long and short term goals of care.     OBJECTIVE IMPAIRMENTS: decreased ROM, decreased strength, impaired UE functional use, and pain.   ACTIVITY LIMITATIONS: carrying, lifting, bathing, dressing, and reach over head  PARTICIPATION LIMITATIONS: meal prep, cleaning, laundry, driving, shopping, community activity, yard work, and housework  PERSONAL FACTORS: 1-2 comorbidities: HTN, anxiety are also affecting patient's functional outcome.   REHAB POTENTIAL: Good  CLINICAL DECISION MAKING: Stable/uncomplicated  EVALUATION COMPLEXITY: Low   GOALS: Goals reviewed with patient? Yes  SHORT TERM GOALS: Target date: 09/28/2024  Pt will be independent with HEP in order to improve strength and decrease pain in order to improve pain-free function at home  Baseline: EVAL- No formal HEP in place  LONG TERM GOALS: Target date: 11/09/2024  Pt will decrease quick DASH score by at least 8% in order to demonstrate clinically significant reduction in disability. Baseline:  EVAL= 68% Goal status: INITIAL  2.  Pt will decrease worst pain as reported on NPRS by at least 3 points in order to demonstrate clinically significant reduction in pain. Baseline: Worst L shoulder pain= 10/10 Goal status: INITIAL  3.  Patient will present with > 160 deg shoulder abduction AROM for improved functional movement of left shoulder.  Baseline:  Goal status: INITIAL  4.  Pt will increase strength of  by at least 1/2 MMT grade in order to demonstrate improvement in strength and function  Baseline: EVAL (see chart)   PLAN:  PT FREQUENCY: 1-2x/week  PT DURATION: 12 weeks  PLANNED INTERVENTIONS: 97164- PT Re-evaluation, 97750- Physical Performance Testing, 97110-Therapeutic exercises, 97530- Therapeutic activity, [id number]- Neuromuscular re-education, 97535- Self Care,  97140- Manual therapy, [id number]- Electrical stimulation (manual), 79439 (1-2 muscles), 20561 (3+ muscles)- Dry Needling, Patient/Family education, Taping, Joint mobilization, Joint manipulation, Spinal manipulation, Spinal mobilization, Cryotherapy, and Moist heat  PLAN FOR NEXT SESSION:  Review and progress Left shoulder ROM/Strengthening and add to HEP as appropriate. Manual therapy for ROM/pain management Modalities as needed for Pain relief Progress to pain free resistive  RC strengthening next visit if appropriate.   8:30 AM, 09/06/2024 Norman Sharps, PT, DPT Physical Therapist - [place]  [facility]  [phone]    "

## 2024-09-11 ENCOUNTER — Ambulatory Visit

## 2024-09-11 DIAGNOSIS — M6281 Muscle weakness (generalized): Secondary | ICD-10-CM

## 2024-09-11 DIAGNOSIS — M25512 Pain in left shoulder: Secondary | ICD-10-CM

## 2024-09-11 DIAGNOSIS — M25612 Stiffness of left shoulder, not elsewhere classified: Secondary | ICD-10-CM

## 2024-09-11 NOTE — Therapy (Signed)
 " OUTPATIENT PHYSICAL THERAPY TREATMENT  Patient Name: Caitlyn [name] MRN: [id number] DOB:06/09/1966, 59 y.o., female Today's Date: 09/11/2024  END OF SESSION:  PT End of Session - 09/11/24 1400     Visit Number 5    Number of Visits 24    Date for Recertification  11/09/24    Progress Note Due on Visit 10    PT Start Time 1400    PT Stop Time 1440    PT Time Calculation (min) 40 min    Activity Tolerance Patient tolerated treatment well;Patient limited by pain    Behavior During Therapy WFL for tasks assessed/performed           Past Medical History:  Diagnosis Date   Anemia    Anxiety    Arthritis    Complication of anesthesia    a.) anesthesia awareness - has woken up several times during surgery in the past; could not move but could hear   COVID-19    DDD (degenerative disc disease), cervical    DDD (degenerative disc disease), lumbosacral    Depression    Dyspepsia    GERD (gastroesophageal reflux disease)    Heart burn    Heart murmur    Hypertension    Irregular menstrual cycle    Left cervical radiculopathy    Myalgia    Non-rheumatic mitral regurgitation    Non-rheumatic tricuspid valve insufficiency    Nonrheumatic aortic (valve) insufficiency    Stress incontinence    T2DM (type 2 diabetes mellitus) (HCC)    Transfusion of blood product refused for religious reason (Jehovah's witness)    Vitamin D deficiency    Past Surgical History:  Procedure Laterality Date   ABDOMINAL HYSTERECTOMY Left 01/18/2017   Procedure: HYSTERECTOMY ABDOMINAL WITH LEFT SALPINGO OOPHERECTOMY;  Surgeon: Kathe Gladis LABOR, MD;  Location: [facility];  Service: Gynecology;  Laterality: Left;   ANTERIOR AND POSTERIOR REPAIR N/A 06/07/2023   Procedure: ANTERIOR (CYSTOCELE) AND POSTERIOR REPAIR (RECTOCELE) WITH TOT;  Surgeon: Janit Alm Agent, MD;  Location: [facility];  Service: Gynecology;  Laterality: N/A;   BLADDER SUSPENSION N/A 06/07/2023   Procedure: TOT;  Surgeon:  Janit Alm Agent, MD;  Location: [facility];  Service: Gynecology;  Laterality: N/A;   CHOLECYSTECTOMY     CHOLECYSTECTOMY, LAPAROSCOPIC     COLONOSCOPY N/A 12/27/2023   Procedure: COLONOSCOPY;  Surgeon: Onita Elspeth Sharper, DO;  Location: [facility];  Service: Gastroenterology;  Laterality: N/A;  DM  (Spanish Interpreter needed)   COLONOSCOPY WITH PROPOFOL  N/A 11/04/2017   Procedure: COLONOSCOPY WITH PROPOFOL ;  Surgeon: Gaylyn Gladis PENNER, MD;  Location: [facility] END[facility]COPY;  Service: Endoscopy;  Laterality: N/A;   ESOPHAGOGASTRODUODENOSCOPY N/A 11/04/2017   Procedure: ESOPHAGOGASTRODUODENOSCOPY (EGD);  Surgeon: Gaylyn Gladis PENNER, MD;  Location: [facility] ENDOSCOPY;  Service: Endoscopy;  Laterality: N/A;   ESOPHAGOGASTRODUODENOSCOPY     ESOPHAGOGASTRODUODENOSCOPY (EGD) WITH PROPOFOL  N/A 05/30/2019   Procedure: ESOPHAGOGASTRODUODENOSCOPY (EGD) WITH PROPOFOL ;  Surgeon: Toledo, Ladell POUR, MD;  Location: [facility] ENDOSCOPY;  Service: Gastroenterology;  Laterality: N/A;   INCISION TENDON SHEATH HAND Left    JOINT REPLACEMENT     KNEE ARTHROSCOPY Left 12/03/2016   Procedure: ARTHROSCOPY KNEE, partial medial menisectomy;  Surgeon: Sharper Flake, MD;  Location: [facility];  Service: Orthopedics;  Laterality: Left;   KNEE ARTHROTOMY Left 10/21/2017   Procedure: KNEE ARTHROTOMY LEFT LATERAL RELEASE MEDIAL RETINACULART REPAIR POLY EXCHANGE;  Surgeon: Flake Sharper, MD;  Location: [facility];  Service: Orthopedics;  Laterality: Left;   KNEE CLOSED REDUCTION Left 04/15/2017  Procedure: CLOSED MANIPULATION KNEE;  Surgeon: Kathlynn Sharper, MD;  Location: [facility];  Service: Orthopedics;  Laterality: Left;   LAPAROSCOPIC OVARIAN CYSTECTOMY Right    MENISCECTOMY Right    TOTAL KNEE ARTHROPLASTY Right 07/07/2016   Procedure: TOTAL KNEE ARTHROPLASTY;  Surgeon: Sharper Kathlynn, MD;  Location: [facility];  Service: Orthopedics;  Laterality: Right;   TOTAL KNEE ARTHROPLASTY Left 03/16/2017   Procedure: TOTAL KNEE ARTHROPLASTY;   Surgeon: Kathlynn Sharper, MD;  Location: [facility];  Service: Orthopedics;  Laterality: Left;   TUBAL LIGATION      PCP: Dr. Yancy Roof  REFERRING PROVIDER: Dr. Sharper Kathlynn  REFERRING DIAG: Left shoulder pain; Partial tear Left rotator cuff  THERAPY DIAG:  Acute pain of left shoulder  Impaired range of motion of left shoulder  Muscle weakness (generalized)  Rationale for Evaluation and Treatment: Rehabilitation  ONSET DATE: June-July 2025  SUBJECTIVE:                                                                                                                                                                                      SUBJECTIVE STATEMENT: Patient reports doing okay today with 4-5/10 pain in the L shoulder.   PERTINENT HISTORY: Patient reports Lots of pain in shoulder with difficulty lifting. She states pain began back in June/July- doing some rearranging in the home and was trying to move a mattress and felt a pop in her shoulder.Patient presents with referral for PT for left shoulder pain and report of partial tear Left RC muscle. Per MRI report - Supraspinatus and MD wanted her to go to PT. Patient had injection left shoulder last week and reports overall worsening of pain since then. See above section for Medical history.   PAIN:  Are you having pain? 4/10 left shoulder; 4/10 across the neck posterior (bilat)   PRECAUTIONS: None  WEIGHT BEARING RESTRICTIONS: No  FALLS:  Has patient fallen in last 6 months? 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 Enjoys.  PLOF: Independent  PATIENT GOALS: Move my arm without pain  OBJECTIVE:  Note: Objective measures were completed at Evaluation unless otherwise noted.  DIAGNOSTIC FINDINGS:  CLINICAL DATA:  Left shoulder pain    EXAM: MRI OF THE LEFT SHOULDER WITHOUT CONTRAST   TECHNIQUE: Multiplanar, multisequence MR imaging of the shoulder was performed. No intravenous contrast was administered.   COMPARISON:  None Available.   FINDINGS: Rotator cuff: Mild supraspinatus tendinosis with a tiny insertional interstitial tear. Moderate infraspinatus tendinosis. Teres minor tendon is intact.  Subscapularis tendon is intact.   Muscles: No muscle atrophy or edema. No intramuscular fluid collection or hematoma.   Biceps Long Head: Intraarticular and extraarticular portions of the biceps tendon are intact.   Acromioclavicular Joint: Mild arthropathy of the acromioclavicular joint. No subacromial/subdeltoid bursal fluid.   Glenohumeral Joint: No joint effusion. No chondral defect.   Labrum: Grossly intact, but evaluation is limited by lack of intraarticular fluid/contrast.   Bones: No fracture or dislocation. No aggressive osseous lesion.   Other: No fluid collection or hematoma.   IMPRESSION: 1. Mild supraspinatus tendinosis with a tiny insertional interstitial tear. 2. Moderate infraspinatus tendinosis.     Electronically Signed   By: Julaine Blanch M.D.   On: 08/01/2024 15:30  PATIENT SURVEYS:  QUICK DASH      Please rate your ability do the following activities in the last week by selecting the number below the appropriate response.     Activities Rating  Open a tight or new jar.  5  Do heavy household chores (e.g., wash walls, floors). 2  Carry a shopping bag or briefcase 5  Wash your back. 4  Use a knife to cut food. 2  Recreational activities in which you take some force or impact through your arm, shoulder or hand (e.g., golf, hammering, tennis, etc.). 3  During the past week, to what extent has your arm, shoulder or hand problem interfered with your normal social activities with family, friends, neighbors or groups?  3  During the past week, were you limited in your work or other  regular daily activities as a result of your arm, shoulder or hand problem? 4  Rate the severity of the following symptoms in the last week: Arm, Shoulder, or hand pain. 4  Rate the severity of the following symptoms in the last week: Tingling (pins and needles) in your arm, shoulder or hand. 5  During the past week, how much difficulty have you had sleeping because of the pain in your arm, shoulder or hand?  4   41  (A QuickDASH score may not be calculated if there is greater than 1 missing item.) [phone]   [phone]  Quick Dash Disability/Symptom Score: [(sum of  (n) responses/ (n)] x 25 =  [phone]     Minimally Clinically Important Difference (MCID): 15-20 points    COGNITION: Overall cognitive status: Within functional limits for tasks assessed     SENSATION: Light touch: Numbness and tingling in left UE  POSTURE: Forward head and rounded shoulders  UPPER EXTREMITY ROM:   Active ROM Right eval Left eval  Shoulder flexion  142*   Shoulder extension    Shoulder abduction  82*  Shoulder adduction    Shoulder internal rotation  88  Shoulder external rotation  70*  (Blank rows = not tested)  UPPER EXTREMITY MMT:  MMT Right eval Left eval  Shoulder flexion 5 2-  Shoulder extension    Shoulder abduction 5 2-  Shoulder adduction    Shoulder internal rotation 5 5  Shoulder external rotation 5 4  Middle trapezius    Lower trapezius    Elbow flexion 5 4+  Elbow extension 5 5  (Blank rows = not tested)  SHOULDER SPECIAL TESTS: Impingement tests: Neer impingement test: positive  and Hawkins/Kennedy impingement test: positive  Instability tests: Load and shift test: negative and Sulcus sign: negative Rotator cuff assessment: Full can test: positive  and Infraspinatus test: negative Biceps assessment: Speed's test: positive   JOINT MOBILITY TESTING:  Guarded today with all joint mobility testing  PALPATION:  Most tender along Left superior lateral shoulder and  down lateral upper arm. Mild tenderness along anterior shoulder as well.                                                                                                                              TREATMENT DATE: 09/11/2024   Pulleys x25  Moved to supine on plinth  Manual treatment: 5'  - Long axis distraction and grades 1-2 joint mobilizations for pain relief.  -Chest press 3x10 with PVC pipe.  - Shoulder flexion overhead: 2x10  -Sidelying L shoulder ER: 2x10   -Seated Rows: 2x10 RTB -Seated upper trapezius stretch: 3x30' -Seated reverse fly with RTB: 2x10  -Wall slides:    PATIENT EDUCATION: Education details: updated HEP instructions.  Person educated: Patient Education method: Explanation Education comprehension: verbalized understanding, returned demonstration, verbal cues required, tactile cues required, and needs further education  HOME EXERCISE PROGRAM: Access Code: [email or web address] URL: [email or web address] Date: 08/30/2024 Prepared by: Peggye Linear  Exercises - Standing Isometric Shoulder Abduction with Doorway - Arm Bent  - 2-3 x daily - 1 sets - 10 reps - 5seg hold - Standing Isometric Shoulder External Rotation with Doorway  - 2-3 x weekly - 1 sets - 10 reps - 5seg hold - Standing Isometric Shoulder Internal Rotation at Doorway  - 2-3 x weekly - 1 sets - 10 reps - 5seg hold - Isometric Shoulder Extension at Wall  - 2-3 x daily - 1 sets - 10 reps - 5seg hold - Standing Isometric Shoulder Adduction with Towel Roll  - 2-3 x daily - 1 sets - 10 reps - 5seg hold - Seated Shoulder Flexion AAROM with Pulley Behind  - 2-3 x daily - 1 sets - 20 reps - 3seg hold  ASSESSMENT:  CLINICAL IMPRESSION: Progressed patient today with AAROM exercises in supine with wand. She performed wand press and flexion overhead very well with minimal to no pain reported. I also added periscapular strengthening with rows and reverse fly using lightweight Theraband. Pt finished  treatment with wall slides in flexion for improved mobility and pain relief. Overall, she performed progression exercises well and reported only 5-6/10 pain following treatment. Exercises were tailored to be pain free throughout treatment session. Patient may benefit from continuing skilled PT 2x/week at this time for further progress toward goals. L shoulder weakness still noted throughout treatment session.     OBJECTIVE IMPAIRMENTS: decreased ROM, decreased strength, impaired UE functional use, and pain.   ACTIVITY LIMITATIONS: carrying, lifting, bathing, dressing, and reach over head  PARTICIPATION LIMITATIONS: meal prep, cleaning, laundry, driving, shopping, community activity, yard work, and housework  PERSONAL FACTORS: 1-2 comorbidities: HTN, anxiety are also affecting patient's functional outcome.   REHAB POTENTIAL: Good  CLINICAL DECISION MAKING: Stable/uncomplicated  EVALUATION COMPLEXITY: Low   GOALS: Goals reviewed with patient? Yes  SHORT TERM GOALS: Target date: 09/28/2024  Pt will be  independent with HEP in order to improve strength and decrease pain in order to improve pain-free function at home  Baseline: EVAL- No formal HEP in place  LONG TERM GOALS: Target date: 11/09/2024  Pt will decrease quick DASH score by at least 8% in order to demonstrate clinically significant reduction in disability. Baseline:  EVAL= 68% Goal status: INITIAL  2.  Pt will decrease worst pain as reported on NPRS by at least 3 points in order to demonstrate clinically significant reduction in pain. Baseline: Worst L shoulder pain= 10/10 Goal status: INITIAL  3.  Patient will present with > 160 deg shoulder abduction AROM for improved functional movement of left shoulder.  Baseline:  Goal status: INITIAL  4.  Pt will increase strength of  by at least 1/2 MMT grade in order to demonstrate improvement in strength and function  Baseline: EVAL (see chart)   PLAN:  PT FREQUENCY:  1-2x/week  PT DURATION: 12 weeks  PLANNED INTERVENTIONS: [id number]- PT Re-evaluation, 97750- Physical Performance Testing, 97110-Therapeutic exercises, 97530- Therapeutic activity, [id number]- Neuromuscular re-education, 97535- Self Care, [id number]- Manual therapy, [id number]- Electrical stimulation (manual), 20560 (1-2 muscles), 20561 (3+ muscles)- Dry Needling, Patient/Family education, Taping, Joint mobilization, Joint manipulation, Spinal manipulation, Spinal mobilization, Cryotherapy, and Moist heat  PLAN FOR NEXT SESSION:  Review and progress Left shoulder ROM/Strengthening and add to HEP as appropriate. Manual therapy for ROM/pain management Modalities as needed for Pain relief Progress to pain free resistive  RC strengthening next visit if appropriate.   2:46 PM, 09/11/2024 Norman Sharps, PT, DPT Physical Therapist - [place]  [facility]  [phone]    "

## 2024-09-13 ENCOUNTER — Ambulatory Visit

## 2024-09-20 ENCOUNTER — Ambulatory Visit

## 2024-09-20 DIAGNOSIS — M25512 Pain in left shoulder: Secondary | ICD-10-CM | POA: Diagnosis not present

## 2024-09-20 DIAGNOSIS — M25612 Stiffness of left shoulder, not elsewhere classified: Secondary | ICD-10-CM

## 2024-09-20 DIAGNOSIS — M6281 Muscle weakness (generalized): Secondary | ICD-10-CM

## 2024-09-20 NOTE — Therapy (Signed)
 " OUTPATIENT PHYSICAL THERAPY TREATMENT  Patient Name: Caitlyn [name] MRN: [id number] DOB:04/24/1966, 59 y.o., female Today's Date: 09/20/2024  END OF SESSION:  PT End of Session - 09/20/24 1153     Visit Number 6    Number of Visits 24    Date for Recertification  11/09/24    Progress Note Due on Visit 10    PT Start Time 1149    Activity Tolerance Patient tolerated treatment well;Patient limited by pain    Behavior During Therapy [facility] for tasks assessed/performed            Past Medical History:  Diagnosis Date   Anemia    Anxiety    Arthritis    Complication of anesthesia    a.) anesthesia awareness - has woken up several times during surgery in the past; could not move but could hear   COVID-19    DDD (degenerative disc disease), cervical    DDD (degenerative disc disease), lumbosacral    Depression    Dyspepsia    GERD (gastroesophageal reflux disease)    Heart burn    Heart murmur    Hypertension    Irregular menstrual cycle    Left cervical radiculopathy    Myalgia    Non-rheumatic mitral regurgitation    Non-rheumatic tricuspid valve insufficiency    Nonrheumatic aortic (valve) insufficiency    Stress incontinence    T2DM (type 2 diabetes mellitus) (HCC)    Transfusion of blood product refused for religious reason (Jehovah's witness)    Vitamin D deficiency    Past Surgical History:  Procedure Laterality Date   ABDOMINAL HYSTERECTOMY Left 01/18/2017   Procedure: HYSTERECTOMY ABDOMINAL WITH LEFT SALPINGO OOPHERECTOMY;  Surgeon: Kathe Gladis LABOR, MD;  Location: [facility] ORS;  Service: Gynecology;  Laterality: Left;   ANTERIOR AND POSTERIOR REPAIR N/A 06/07/2023   Procedure: ANTERIOR (CYSTOCELE) AND POSTERIOR REPAIR (RECTOCELE) WITH TOT;  Surgeon: Janit Alm Agent, MD;  Location: [facility];  Service: Gynecology;  Laterality: N/A;   BLADDER SUSPENSION N/A 06/07/2023   Procedure: TOT;  Surgeon: Janit Alm Agent, MD;  Location: [facility];  Service:  Gynecology;  Laterality: N/A;   CHOLECYSTECTOMY     CHOLECYSTECTOMY, LAPAROSCOPIC     COLONOSCOPY N/A 12/27/2023   Procedure: COLONOSCOPY;  Surgeon: Onita Elspeth Sharper, DO;  Location: [facility] ENDOSCOPY;  Service: Gastroenterology;  Laterality: N/A;  DM  (Spanish Interpreter needed)   COLONOSCOPY WITH PROPOFOL  N/A 11/04/2017   Procedure: COLONOSCOPY WITH PROPOFOL ;  Surgeon: Gaylyn Gladis PENNER, MD;  Location: [facility] ENDOSCOPY;  Service: Endoscopy;  Laterality: N/A;   ESOPHAGOGASTRODUODENOSCOPY N/A 11/04/2017   Procedure: ESOPHAGOGASTRODUODENOSCOPY (EGD);  Surgeon: Gaylyn Gladis PENNER, MD;  Location: [facility] ENDOSCOPY;  Service: Endoscopy;  Laterality: N/A;   ESOPHAGOGASTRODUODENOSCOPY     ESOPHAGOGASTRODUODENOSCOPY (EGD) WITH PROPOFOL  N/A 05/30/2019   Procedure: ESOPHAGOGASTRODUODENOSCOPY (EGD) WITH PROPOFOL ;  Surgeon: Toledo, Ladell POUR, MD;  Location: [facility] ENDOSCOPY;  Service: Gastroenterology;  Laterality: N/A;   INCISION TENDON SHEATH HAND Left    JOINT REPLACEMENT     KNEE ARTHROSCOPY Left 12/03/2016   Procedure: ARTHROSCOPY KNEE, partial medial menisectomy;  Surgeon: Sharper Flake, MD;  Location: [facility];  Service: Orthopedics;  Laterality: Left;   KNEE ARTHROTOMY Left 10/21/2017   Procedure: KNEE ARTHROTOMY LEFT LATERAL RELEASE MEDIAL RETINACULART REPAIR POLY EXCHANGE;  Surgeon: Flake Sharper, MD;  Location: [facility];  Service: Orthopedics;  Laterality: Left;   KNEE CLOSED REDUCTION Left 04/15/2017   Procedure: CLOSED MANIPULATION KNEE;  Surgeon: Flake Sharper, MD;  Location: [facility];  Service: Orthopedics;  Laterality: Left;   LAPAROSCOPIC OVARIAN CYSTECTOMY Right    MENISCECTOMY Right    TOTAL KNEE ARTHROPLASTY Right 07/07/2016   Procedure: TOTAL KNEE ARTHROPLASTY;  Surgeon: Ozell Flake, MD;  Location: [facility];  Service: Orthopedics;  Laterality: Right;   TOTAL KNEE ARTHROPLASTY Left 03/16/2017   Procedure: TOTAL KNEE ARTHROPLASTY;  Surgeon: Flake Ozell, MD;  Location: [facility];   Service: Orthopedics;  Laterality: Left;   TUBAL LIGATION      PCP: Dr. Yancy Roof  REFERRING PROVIDER: Dr. Ozell Flake  REFERRING DIAG: Left shoulder pain; Partial tear Left rotator cuff  THERAPY DIAG:  Acute pain of left shoulder  Impaired range of motion of left shoulder  Muscle weakness (generalized)  Rationale for Evaluation and Treatment: Rehabilitation  ONSET DATE: June-July 2025  SUBJECTIVE:                                                                                                                                                                                      SUBJECTIVE STATEMENT: Patient arrived with interpreter. She reports that she continues to have L shoulder pain daily. Right now it is not too bad maybe a 3 due as she has taken pain medication this morning. Patient also reports having neck pain ever since she received the injection in the L shoulder.   PERTINENT HISTORY: Patient reports Lots of pain in shoulder with difficulty lifting. She states pain began back in June/July- doing some rearranging in the home and was trying to move a mattress and felt a pop in her shoulder.Patient presents with referral for PT for left shoulder pain and report of partial tear Left RC muscle. Per MRI report - Supraspinatus and MD wanted her to go to PT. Patient had injection left shoulder last week and reports overall worsening of pain since then. See above section for Medical history.   PAIN:  Are you having pain? 4/10 left shoulder; 4/10 across the neck posterior (bilat)   PRECAUTIONS: None  WEIGHT BEARING RESTRICTIONS: No  FALLS:  Has patient fallen in last 6 months? 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 Enjoys.  PLOF: Independent  PATIENT GOALS: Move my arm without  pain  OBJECTIVE:  Note: Objective measures were completed at Evaluation unless otherwise noted.  DIAGNOSTIC FINDINGS:  CLINICAL DATA:  Left shoulder pain   EXAM: MRI OF THE LEFT SHOULDER WITHOUT CONTRAST   TECHNIQUE: Multiplanar, multisequence MR imaging of the shoulder was performed. No intravenous contrast was administered.   COMPARISON:  None Available.   FINDINGS: Rotator cuff: Mild supraspinatus tendinosis with a tiny insertional interstitial tear. Moderate infraspinatus tendinosis. Teres minor tendon is intact. Subscapularis tendon is intact.   Muscles: No muscle atrophy or edema. No intramuscular fluid collection or hematoma.   Biceps Long Head: Intraarticular and extraarticular portions of the biceps tendon are intact.   Acromioclavicular Joint: Mild arthropathy of the acromioclavicular joint. No subacromial/subdeltoid bursal fluid.   Glenohumeral Joint: No joint effusion. No chondral defect.   Labrum: Grossly intact, but evaluation is limited by lack of intraarticular fluid/contrast.   Bones: No fracture or dislocation. No aggressive osseous lesion.   Other: No fluid collection or hematoma.   IMPRESSION: 1. Mild supraspinatus tendinosis with a tiny insertional interstitial tear. 2. Moderate infraspinatus tendinosis.     Electronically Signed   By: Julaine Blanch M.D.   On: 08/01/2024 15:30  PATIENT SURVEYS:  QUICK DASH      Please rate your ability do the following activities in the last week by selecting the number below the appropriate response.     Activities Rating  Open a tight or new jar.  5  Do heavy household chores (e.g., wash walls, floors). 2  Carry a shopping bag or briefcase 5  Wash your back. 4  Use a knife to cut food. 2  Recreational activities in which you take some force or impact through your arm, shoulder or hand (e.g., golf, hammering, tennis, etc.). 3  During the past week, to what extent has your arm, shoulder or hand problem  interfered with your normal social activities with family, friends, neighbors or groups?  3  During the past week, were you limited in your work or other regular daily activities as a result of your arm, shoulder or hand problem? 4  Rate the severity of the following symptoms in the last week: Arm, Shoulder, or hand pain. 4  Rate the severity of the following symptoms in the last week: Tingling (pins and needles) in your arm, shoulder or hand. 5  During the past week, how much difficulty have you had sleeping because of the pain in your arm, shoulder or hand?  4   41  (A QuickDASH score may not be calculated if there is greater than 1 missing item.) [phone]   [phone]  Quick Dash Disability/Symptom Score: [(sum of  (n) responses/ (n)] x 25 =  [phone]     Minimally Clinically Important Difference (MCID): 15-20 points    COGNITION: Overall cognitive status: Within functional limits for tasks assessed     SENSATION: Light touch: Numbness and tingling in left UE  POSTURE: Forward head and rounded shoulders  UPPER EXTREMITY ROM:   Active ROM Right eval Left eval  Shoulder flexion  142*   Shoulder extension    Shoulder abduction  82*  Shoulder adduction    Shoulder internal rotation  88  Shoulder external rotation  70*  (Blank rows = not tested)  UPPER EXTREMITY MMT:  MMT Right eval Left eval  Shoulder flexion 5 2-  Shoulder extension    Shoulder abduction 5 2-  Shoulder adduction    Shoulder internal rotation 5 5  Shoulder external rotation 5 4  Middle trapezius    Lower trapezius    Elbow flexion 5 4+  Elbow extension 5 5  (Blank rows = not tested)  SHOULDER SPECIAL TESTS: Impingement tests: Neer impingement test: positive  and Hawkins/Kennedy impingement test: positive  Instability tests: Load and shift test: negative and Sulcus sign:  negative Rotator cuff assessment: Full can test: positive  and Infraspinatus test: negative Biceps assessment: Speed's test:  positive   JOINT MOBILITY TESTING:  Guarded today with all joint mobility testing  PALPATION:  Most tender along Left superior lateral shoulder and down lateral upper arm. Mild tenderness along anterior shoulder as well.                                                                                                                              TREATMENT DATE: 09/20/24   Pulleys x30 flexion   Pulleys x30 scaption.   -Seated rows with RTB 3x10  -1 kg ball on wall: circles, lateral movement, vertical movement x20 each for improved shoulder stability.   -Seated reverse fly: RTB 3x10  -Isometrics using ball on wall x10 each with 5'' holds. Flexion, ER, Abd.      PATIENT EDUCATION: Education details: updated HEP instructions.  Person educated: Patient Education method: Explanation Education comprehension: verbalized understanding, returned demonstration, verbal cues required, tactile cues required, and needs further education  HOME EXERCISE PROGRAM: Access Code: [email or web address] URL: [email or web address] Date: 08/30/2024 Prepared by: Peggye Linear  Exercises - Standing Isometric Shoulder Abduction with Doorway - Arm Bent  - 2-3 x daily - 1 sets - 10 reps - 5seg hold - Standing Isometric Shoulder External Rotation with Doorway  - 2-3 x weekly - 1 sets - 10 reps - 5seg hold - Standing Isometric Shoulder Internal Rotation at Doorway  - 2-3 x weekly - 1 sets - 10 reps - 5seg hold - Isometric Shoulder Extension at Wall  - 2-3 x daily - 1 sets - 10 reps - 5seg hold - Standing Isometric Shoulder Adduction with Towel Roll  - 2-3 x daily - 1 sets - 10 reps - 5seg hold - Seated Shoulder Flexion AAROM with Pulley Behind  - 2-3 x daily - 1 sets - 20 reps - 3seg hold  ASSESSMENT:  CLINICAL IMPRESSION: Patient continued to work on improving L shoulder mobility and strength while reducing pain in L shoulder. She continues to have pain at end range L shoulder flexion/abduction.  Scapular strengthening performed utilizing red theraband for resistance. Isometrics also performed with ball on wall for increased L shoulder strength. Weighted ball was utilized for improved shoulder stability. Patient did report increased L shoulder pain with isometric ER. Overall, patient continues to stay motivated throughout her PT treatment sessions. Decreased strength and mobility still noted in L shoulder throughout treatment session.  Patient may benefit from continuing skilled PT 2x/week at this time for further progress toward goals.     OBJECTIVE IMPAIRMENTS: decreased ROM, decreased strength, impaired UE functional use, and pain.   ACTIVITY LIMITATIONS: carrying, lifting, bathing, dressing, and reach over head  PARTICIPATION LIMITATIONS: meal prep, cleaning, laundry, driving, shopping, community activity, yard work, and housework  PERSONAL FACTORS: 1-2 comorbidities: HTN, anxiety are also affecting patient's functional outcome.   REHAB POTENTIAL: Good  CLINICAL DECISION MAKING: Stable/uncomplicated  EVALUATION COMPLEXITY: Low   GOALS: Goals reviewed with patient? Yes  SHORT TERM GOALS: Target date: 09/28/2024  Pt will be independent with HEP in order to improve strength and decrease pain in order to improve pain-free function at home  Baseline: EVAL- No formal HEP in place  LONG TERM GOALS: Target date: 11/09/2024  Pt will decrease quick DASH score by at least 8% in order to demonstrate clinically significant reduction in disability. Baseline:  EVAL= 68% Goal status: INITIAL  2.  Pt will decrease worst pain as reported on NPRS by at least 3 points in order to demonstrate clinically significant reduction in pain. Baseline: Worst L shoulder pain= 10/10 Goal status: INITIAL  3.  Patient will present with > 160 deg shoulder abduction AROM for improved functional movement of left shoulder.  Baseline:  Goal status: INITIAL  4.  Pt will increase strength of  by at least  1/2 MMT grade in order to demonstrate improvement in strength and function  Baseline: EVAL (see chart)   PLAN:  PT FREQUENCY: 1-2x/week  PT DURATION: 12 weeks  PLANNED INTERVENTIONS: 97164- PT Re-evaluation, 97750- Physical Performance Testing, 97110-Therapeutic exercises, 97530- Therapeutic activity, [id number]- Neuromuscular re-education, 97535- Self Care, [id number]- Manual therapy, [id number]- Electrical stimulation (manual), 20560 (1-2 muscles), 20561 (3+ muscles)- Dry Needling, Patient/Family education, Taping, Joint mobilization, Joint manipulation, Spinal manipulation, Spinal mobilization, Cryotherapy, and Moist heat  PLAN FOR NEXT SESSION:  Review and progress Left shoulder ROM/Strengthening and add to HEP as appropriate. Manual therapy for ROM/pain management Modalities as needed for Pain relief Progress to pain free resistive  RC strengthening next visit if appropriate.   12:27 PM, 09/20/24 Norman Sharps, PT, DPT Physical Therapist -   [facility]  [phone]    "

## 2024-09-25 ENCOUNTER — Ambulatory Visit

## 2024-09-28 ENCOUNTER — Ambulatory Visit
